# Patient Record
Sex: Male | Born: 1945 | Race: White | Hispanic: No
Health system: Southern US, Academic
[De-identification: ages and names within clinical notes are randomized; demographics above are authoritative.]

## PROBLEM LIST (undated history)

## (undated) ENCOUNTER — Ambulatory Visit

## (undated) ENCOUNTER — Encounter

## (undated) ENCOUNTER — Telehealth

## (undated) ENCOUNTER — Ambulatory Visit: Payer: MEDICARE | Attending: Cardiovascular Disease | Primary: Cardiovascular Disease

## (undated) ENCOUNTER — Encounter: Attending: Cardiovascular Disease | Primary: Cardiovascular Disease

## (undated) ENCOUNTER — Non-Acute Institutional Stay: Payer: MEDICARE | Attending: Cardiovascular Disease | Primary: Cardiovascular Disease

## (undated) ENCOUNTER — Encounter
Attending: Student in an Organized Health Care Education/Training Program | Primary: Student in an Organized Health Care Education/Training Program

## (undated) ENCOUNTER — Ambulatory Visit: Payer: MEDICARE

## (undated) ENCOUNTER — Encounter: Payer: MEDICARE | Attending: Cardiovascular Disease | Primary: Cardiovascular Disease

## (undated) ENCOUNTER — Encounter: Attending: Hematology | Primary: Hematology

## (undated) ENCOUNTER — Ambulatory Visit: Payer: MEDICARE | Attending: "Endocrinology | Primary: "Endocrinology

## (undated) ENCOUNTER — Encounter: Payer: MEDICARE | Attending: Hematology | Primary: Hematology

## (undated) ENCOUNTER — Ambulatory Visit
Payer: MEDICARE | Attending: Student in an Organized Health Care Education/Training Program | Primary: Student in an Organized Health Care Education/Training Program

## (undated) ENCOUNTER — Encounter
Attending: Pharmacist Clinician (PhC)/ Clinical Pharmacy Specialist | Primary: Pharmacist Clinician (PhC)/ Clinical Pharmacy Specialist

## (undated) ENCOUNTER — Encounter: Payer: MEDICARE | Attending: "Endocrinology | Primary: "Endocrinology

## (undated) ENCOUNTER — Telehealth
Attending: Student in an Organized Health Care Education/Training Program | Primary: Student in an Organized Health Care Education/Training Program

## (undated) ENCOUNTER — Ambulatory Visit: Payer: MEDICARE | Attending: Vascular Surgery | Primary: Vascular Surgery

## (undated) ENCOUNTER — Encounter: Attending: "Endocrinology | Primary: "Endocrinology

## (undated) ENCOUNTER — Inpatient Hospital Stay

## (undated) ENCOUNTER — Ambulatory Visit: Payer: MEDICARE | Attending: Nephrology | Primary: Nephrology

## (undated) ENCOUNTER — Non-Acute Institutional Stay: Payer: MEDICARE | Attending: Hematology | Primary: Hematology

## (undated) ENCOUNTER — Telehealth: Attending: Vascular Neurology | Primary: Vascular Neurology

## (undated) ENCOUNTER — Encounter: Attending: Nephrology | Primary: Nephrology

## (undated) ENCOUNTER — Telehealth: Attending: Vascular Surgery | Primary: Vascular Surgery

## (undated) ENCOUNTER — Telehealth: Attending: Nurse Practitioner | Primary: Nurse Practitioner

## (undated) DIAGNOSIS — I2510 Atherosclerotic heart disease of native coronary artery without angina pectoris: Secondary | ICD-10-CM

## (undated) DIAGNOSIS — I701 Atherosclerosis of renal artery: Secondary | ICD-10-CM

## (undated) DIAGNOSIS — M79671 Pain in right foot: Secondary | ICD-10-CM

## (undated) DIAGNOSIS — I714 Abdominal aortic aneurysm, without rupture, unspecified: Secondary | ICD-10-CM

## (undated) DIAGNOSIS — M79672 Pain in left foot: Secondary | ICD-10-CM

## (undated) DIAGNOSIS — E079 Disorder of thyroid, unspecified: Secondary | ICD-10-CM

## (undated) DIAGNOSIS — I10 Essential (primary) hypertension: Secondary | ICD-10-CM

## (undated) DIAGNOSIS — E230 Hypopituitarism: Secondary | ICD-10-CM

## (undated) DIAGNOSIS — I219 Acute myocardial infarction, unspecified: Secondary | ICD-10-CM

## (undated) HISTORY — PX: OTHER SURGICAL HISTORY: SHX169

## (undated) HISTORY — DX: Pain in right foot: M79.671

## (undated) HISTORY — DX: Pain in left foot: M79.672

---
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
## 2002-06-15 ENCOUNTER — Inpatient Hospital Stay (HOSPITAL_COMMUNITY): Admission: EM | Admit: 2002-06-15 | Discharge: 2002-06-19 | Payer: Self-pay

## 2002-06-15 ENCOUNTER — Encounter: Payer: Self-pay | Admitting: *Deleted

## 2002-06-18 ENCOUNTER — Encounter: Payer: Self-pay | Admitting: Cardiology

## 2002-07-16 ENCOUNTER — Encounter (HOSPITAL_COMMUNITY): Admission: RE | Admit: 2002-07-16 | Discharge: 2002-08-15 | Payer: Self-pay | Admitting: Cardiology

## 2008-01-20 ENCOUNTER — Ambulatory Visit: Payer: Self-pay | Admitting: Cardiology

## 2009-01-18 ENCOUNTER — Ambulatory Visit: Payer: Self-pay | Admitting: Cardiology

## 2009-01-20 ENCOUNTER — Ambulatory Visit: Payer: Self-pay | Admitting: Cardiovascular Disease

## 2009-01-20 ENCOUNTER — Inpatient Hospital Stay (HOSPITAL_COMMUNITY): Admission: AD | Admit: 2009-01-20 | Discharge: 2009-01-21 | Payer: Self-pay | Admitting: Internal Medicine

## 2009-02-02 DIAGNOSIS — D751 Secondary polycythemia: Secondary | ICD-10-CM

## 2009-02-02 DIAGNOSIS — E230 Hypopituitarism: Secondary | ICD-10-CM

## 2009-02-02 DIAGNOSIS — E785 Hyperlipidemia, unspecified: Secondary | ICD-10-CM

## 2009-02-02 DIAGNOSIS — I2510 Atherosclerotic heart disease of native coronary artery without angina pectoris: Secondary | ICD-10-CM

## 2009-02-02 DIAGNOSIS — I498 Other specified cardiac arrhythmias: Secondary | ICD-10-CM

## 2009-02-02 DIAGNOSIS — K219 Gastro-esophageal reflux disease without esophagitis: Secondary | ICD-10-CM

## 2009-02-02 DIAGNOSIS — I10 Essential (primary) hypertension: Secondary | ICD-10-CM | POA: Insufficient documentation

## 2009-02-02 DIAGNOSIS — I2119 ST elevation (STEMI) myocardial infarction involving other coronary artery of inferior wall: Secondary | ICD-10-CM

## 2009-03-08 ENCOUNTER — Emergency Department (HOSPITAL_COMMUNITY): Admission: EM | Admit: 2009-03-08 | Discharge: 2009-03-08 | Payer: Self-pay | Admitting: Emergency Medicine

## 2009-03-22 ENCOUNTER — Encounter (INDEPENDENT_AMBULATORY_CARE_PROVIDER_SITE_OTHER): Payer: Self-pay | Admitting: *Deleted

## 2009-03-22 ENCOUNTER — Emergency Department (HOSPITAL_COMMUNITY): Admission: EM | Admit: 2009-03-22 | Discharge: 2009-03-22 | Payer: Self-pay | Admitting: Emergency Medicine

## 2009-03-22 LAB — CONVERTED CEMR LAB
BUN: 36 mg/dL
CK-MB: <1 ng/mL
CO2: 29 meq/L
Calcium: 9.3 mg/dL
Chloride: 102 meq/L
Creatinine, Ser: 2.84 mg/dL
GFR calc non Af Amer: 23 mL/min
Glomerular Filtration Rate, Af Am: 27 mL/min/{1.73_m2}
Glucose, Bld: 100 mg/dL
HCT: 48.3 %
Hemoglobin: 16.8 g/dL
MCV: 89.3 fL
Platelets: 200 10*3/uL
Potassium: 4.5 meq/L
Sodium: 136 meq/L
Total CK: 128 U/L
Troponin I: 0.06 ng/mL
WBC: 11.6 10*3/uL

## 2009-05-18 ENCOUNTER — Encounter (INDEPENDENT_AMBULATORY_CARE_PROVIDER_SITE_OTHER): Payer: Self-pay | Admitting: *Deleted

## 2009-05-19 ENCOUNTER — Encounter: Payer: Self-pay | Admitting: Cardiology

## 2010-05-21 IMAGING — CR DG CHEST 2V
2 series · 2 of 2 positions shown · non-contrast
Comparison: 01/18/2009

CLINICAL DATA: Chest pain question cavitary lesion

CHEST - 2 VIEW

[view not recorded (1 of 2)]
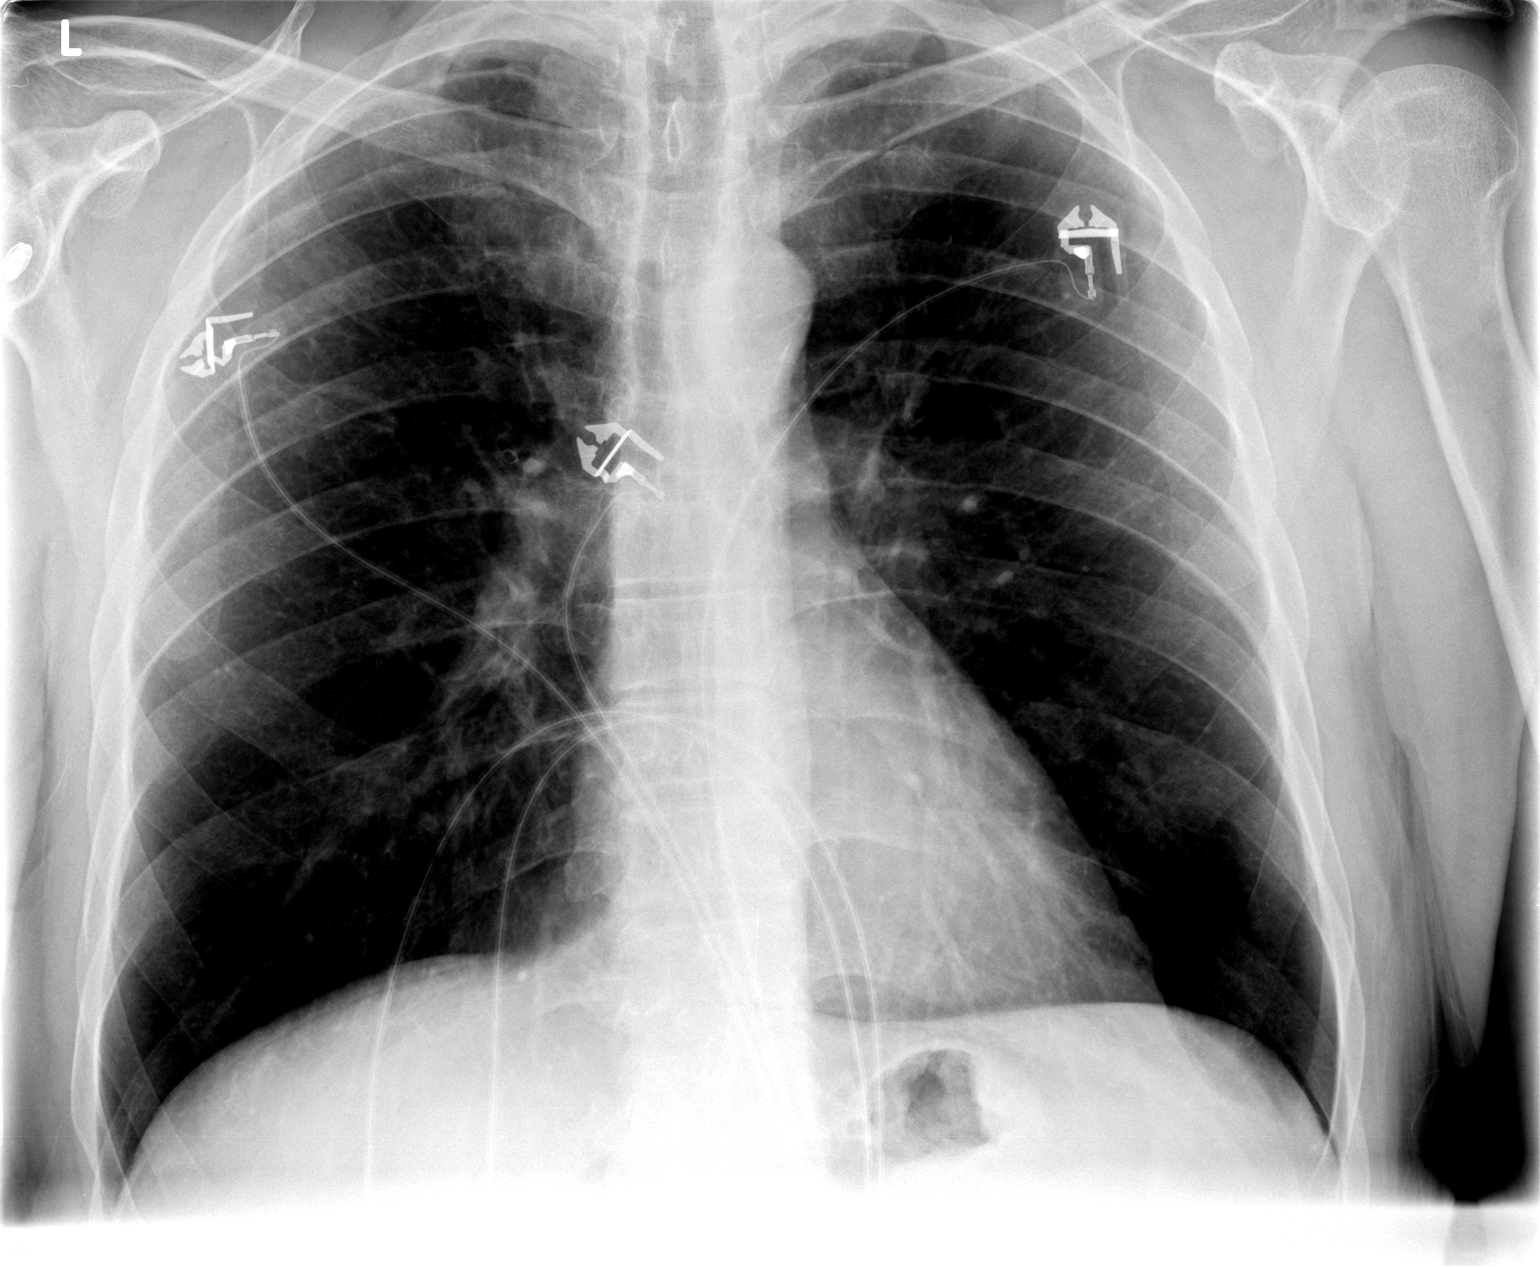

[view not recorded (2 of 2)]
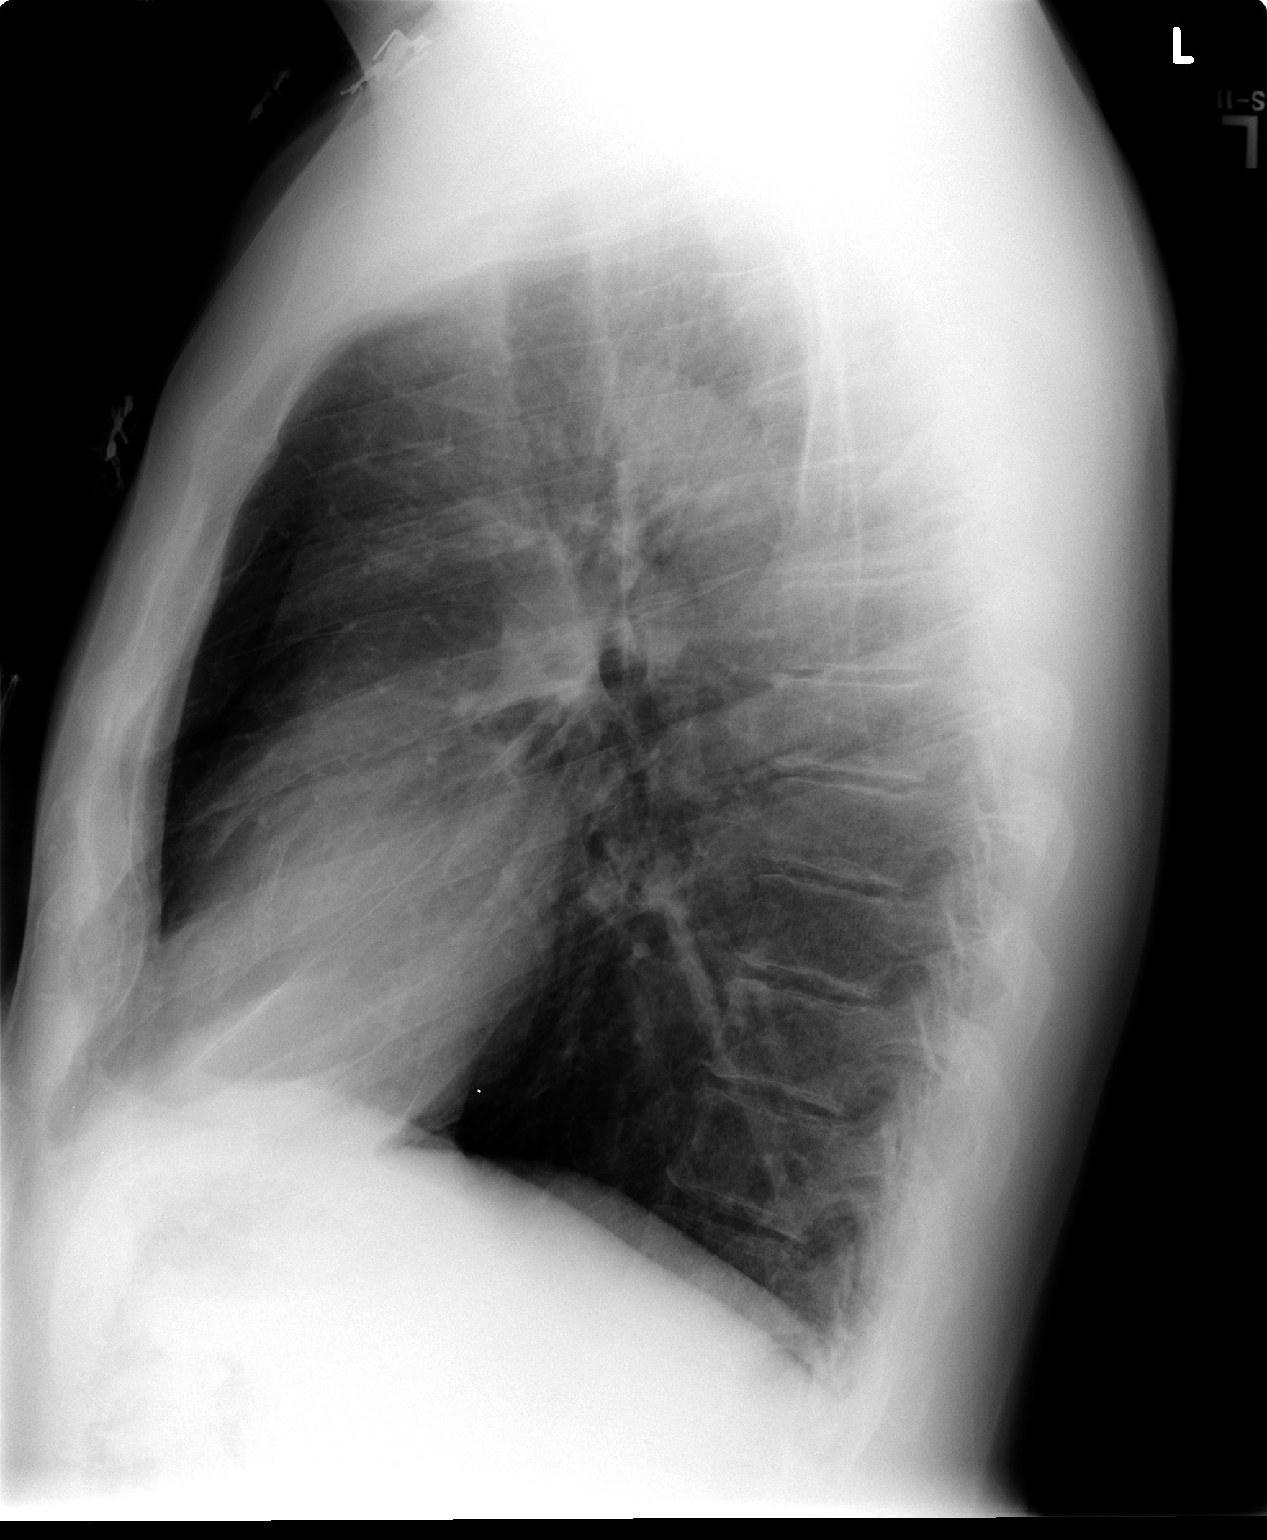

[2 of 2 positions shown; findings below may reference images not displayed]

FINDINGS: Normal heart size, mediastinal contours, and pulmonary vascularity.
Bronchitic changes.
No pulmonary infiltrate, pleural effusion, or pneumothorax.
Bones unremarkable.
No cavitary lung lesion identified.
IMPRESSION: Mild bronchitic changes.

## 2010-11-26 LAB — CBC
HCT: 48.3 % (ref 39.0–52.0)
Hemoglobin: 16.8 g/dL (ref 13.0–17.0)
MCHC: 34.8 g/dL (ref 30.0–36.0)
MCV: 89.3 fL (ref 78.0–100.0)
Platelets: 200 10*3/uL (ref 150–400)
RBC: 5.41 MIL/uL (ref 4.22–5.81)
RDW: 14.1 % (ref 11.5–15.5)
WBC: 11.6 10*3/uL — ABNORMAL HIGH (ref 4.0–10.5)

## 2010-11-26 LAB — POCT CARDIAC MARKERS
CKMB, poc: <1 ng/mL — ABNORMAL LOW (ref 1.0–8.0)
CKMB, poc: <1 ng/mL — ABNORMAL LOW (ref 1.0–8.0)
Myoglobin, poc: 114 ng/mL (ref 12–200)
Myoglobin, poc: 128 ng/mL (ref 12–200)
Troponin i, poc: 0.06 ng/mL (ref 0.00–0.09)
Troponin i, poc: 0.07 ng/mL (ref 0.00–0.09)

## 2010-11-26 LAB — BASIC METABOLIC PANEL
Chloride: 102 mEq/L (ref 96–112)
GFR calc Af Amer: 27 mL/min — ABNORMAL LOW (ref >60)
GFR calc non Af Amer: 23 mL/min — ABNORMAL LOW (ref >60)
Potassium: 4.5 mEq/L (ref 3.5–5.1)
Sodium: 136 mEq/L (ref 135–145)

## 2010-11-26 LAB — DIFFERENTIAL
Basophils Absolute: 0.1 K/uL (ref 0.0–0.1)
Basophils Relative: 1 % (ref 0–1)
Eosinophils Absolute: 0.7 10*3/uL (ref 0.0–0.7)
Eosinophils Relative: 6 % — ABNORMAL HIGH (ref 0–5)
Lymphocytes Relative: 14 % (ref 12–46)
Lymphs Abs: 1.6 10*3/uL (ref 0.7–4.0)
Monocytes Absolute: 0.6 K/uL (ref 0.1–1.0)
Monocytes Relative: 6 % (ref 3–12)
Neutro Abs: 8.6 K/uL — ABNORMAL HIGH (ref 1.7–7.7)
Neutrophils Relative %: 74 % (ref 43–77)

## 2010-11-26 LAB — BASIC METABOLIC PANEL WITH GFR
BUN: 36 mg/dL — ABNORMAL HIGH (ref 6–23)
CO2: 29 meq/L (ref 19–32)
Calcium: 9.3 mg/dL (ref 8.4–10.5)
Creatinine, Ser: 2.84 mg/dL — ABNORMAL HIGH (ref 0.4–1.5)
Glucose, Bld: 100 mg/dL — ABNORMAL HIGH (ref 70–99)

## 2010-11-27 LAB — DIFFERENTIAL
Basophils Relative: 0 % (ref 0–1)
Eosinophils Absolute: 0.8 10*3/uL — ABNORMAL HIGH (ref 0.0–0.7)
Eosinophils Relative: 7 % — ABNORMAL HIGH (ref 0–5)
Lymphs Abs: 2 10*3/uL (ref 0.7–4.0)
Monocytes Absolute: 0.7 10*3/uL (ref 0.1–1.0)
Monocytes Relative: 6 % (ref 3–12)

## 2010-11-27 LAB — BASIC METABOLIC PANEL
CO2: 30 mEq/L (ref 19–32)
Chloride: 99 mEq/L (ref 96–112)
GFR calc Af Amer: 39 mL/min — ABNORMAL LOW (ref >60)
Potassium: 4.1 mEq/L (ref 3.5–5.1)

## 2010-11-27 LAB — POCT CARDIAC MARKERS: Myoglobin, poc: 92.9 ng/mL (ref 12–200)

## 2010-11-27 LAB — CBC
HCT: 50.2 % (ref 39.0–52.0)
MCHC: 34.5 g/dL (ref 30.0–36.0)
MCV: 90.3 fL (ref 78.0–100.0)
RBC: 5.56 MIL/uL (ref 4.22–5.81)
WBC: 12.7 10*3/uL — ABNORMAL HIGH (ref 4.0–10.5)

## 2010-11-27 LAB — URINALYSIS, ROUTINE W REFLEX MICROSCOPIC
Glucose, UA: NEGATIVE mg/dL
Hgb urine dipstick: NEGATIVE
Ketones, ur: NEGATIVE mg/dL
Protein, ur: NEGATIVE mg/dL
pH: 5.5 (ref 5.0–8.0)

## 2010-11-28 LAB — CBC
HCT: 47.6 % (ref 39.0–52.0)
HCT: 50.7 % (ref 39.0–52.0)
Hemoglobin: 16.6 g/dL (ref 13.0–17.0)
Hemoglobin: 17.1 g/dL — ABNORMAL HIGH (ref 13.0–17.0)
MCHC: 33.8 g/dL (ref 30.0–36.0)
MCHC: 34.8 g/dL (ref 30.0–36.0)
RBC: 5.36 MIL/uL (ref 4.22–5.81)
RDW: 14.9 % (ref 11.5–15.5)

## 2010-11-28 LAB — DIFFERENTIAL
Basophils Relative: 1 % (ref 0–1)
Eosinophils Relative: 7 % — ABNORMAL HIGH (ref 0–5)
Lymphocytes Relative: 20 % (ref 12–46)
Monocytes Absolute: 0.6 10*3/uL (ref 0.1–1.0)
Monocytes Relative: 7 % (ref 3–12)
Neutro Abs: 5.2 10*3/uL (ref 1.7–7.7)

## 2010-11-28 LAB — LIPID PANEL
Cholesterol: 189 mg/dL (ref 0–200)
HDL: 22 mg/dL — ABNORMAL LOW (ref >39)
LDL Cholesterol: 144 mg/dL — ABNORMAL HIGH (ref 0–99)
Total CHOL/HDL Ratio: 8.6 RATIO
Triglycerides: 117 mg/dL (ref <150)

## 2010-11-28 LAB — BASIC METABOLIC PANEL
CO2: 29 mEq/L (ref 19–32)
Glucose, Bld: 89 mg/dL (ref 70–99)
Potassium: 3.9 mEq/L (ref 3.5–5.1)
Sodium: 143 mEq/L (ref 135–145)

## 2010-11-28 LAB — CARDIAC PANEL(CRET KIN+CKTOT+MB+TROPI)
CK, MB: 2.5 ng/mL (ref 0.3–4.0)
Relative Index: INVALID (ref 0.0–2.5)
Total CK: 43 U/L (ref 7–232)
Total CK: 46 U/L (ref 7–232)
Total CK: 50 U/L (ref 7–232)

## 2010-11-28 LAB — PROTIME-INR: INR: 1.1 (ref 0.00–1.49)

## 2010-11-29 LAB — DIFFERENTIAL
Basophils Absolute: 0.1 10*3/uL (ref 0.0–0.1)
Eosinophils Relative: 6 % — ABNORMAL HIGH (ref 0–5)
Lymphocytes Relative: 24 % (ref 12–46)
Neutro Abs: 6 10*3/uL (ref 1.7–7.7)

## 2010-11-29 LAB — BASIC METABOLIC PANEL
BUN: 15 mg/dL (ref 6–23)
Calcium: 9.6 mg/dL (ref 8.4–10.5)
Creatinine, Ser: 1.28 mg/dL (ref 0.4–1.5)
GFR calc non Af Amer: 57 mL/min — ABNORMAL LOW (ref >60)
Glucose, Bld: 87 mg/dL (ref 70–99)

## 2010-11-29 LAB — CBC
Platelets: 179 10*3/uL (ref 150–400)
RDW: 14.8 % (ref 11.5–15.5)

## 2010-11-29 LAB — CK TOTAL AND CKMB (NOT AT ARMC)
CK, MB: 2.3 ng/mL (ref 0.3–4.0)
Relative Index: INVALID (ref 0.0–2.5)

## 2010-11-29 LAB — HEPATIC FUNCTION PANEL
ALT: 15 U/L (ref 0–53)
Indirect Bilirubin: 0.3 mg/dL (ref 0.3–0.9)
Total Protein: 5.9 g/dL — ABNORMAL LOW (ref 6.0–8.3)

## 2010-11-29 LAB — POCT CARDIAC MARKERS: Troponin i, poc: <0.05 ng/mL (ref 0.00–0.09)

## 2010-11-29 LAB — TROPONIN I
Troponin I: 0.06 ng/mL (ref 0.00–0.06)
Troponin I: 0.17 ng/mL — ABNORMAL HIGH (ref 0.00–0.06)

## 2011-01-03 NOTE — Consult Note (Signed)
Mathew Rodriguez, Mathew Rodriguez             ACCOUNT NO.:  [id number]   MEDICAL RECORD NO.:  [id number]          PATIENT TYPE:  OBV   LOCATION:  IC05                          FACILITY:  [facility]   PHYSICIAN:  Gerrit Friends. Dietrich Pates, MD, FACCDATE OF BIRTH:  [birth year]   DATE OF CONSULTATION:  01/19/2009  DATE OF DISCHARGE:                                 CONSULTATION   REFERRING PHYSICIAN:  Osvaldo Shipper, MD of [facility]   PRIMARY CARE PHYSICIAN:  Dr. Lewis Moccasin at __________ [facility].   CARDIOLOGIST:  Gerrit Friends. Dietrich Pates, MD, [facility].   REASON FOR CONSULTATION:  Chest pain.   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  He denies associated shortness of  breath, nausea, diaphoresis or syncope.  He took 2 aspirin and then  nitroglycerin, was relieved of his symptoms.  His pain lasted in total  about 1 hour.  He states that his symptoms were similar to, but not as  bad as his myocardial infarction.  He is painfree now.  His EKG is  unremarkable except for sinus bradycardia.  His beta blocker has been  held secondary to heart rates in the 40s.  He has a cavitary lesion on  his anteroposterior chest x-ray.  Repeat chest x-ray is currently  pending.  His troponin has been noted to be elevated at 0.17 and  then  0.34.  We are now asked to further evaluate.   PAST MEDICAL HISTORY:  1. Coronary artery disease.      a.     Status post inferior ST-elevation myocardial infarction       October 2003.  Treated with fiber drug-eluting stent x2 to the       RCA.      b.     Residual cardio disease.      c.     Cardiac catheterization October 2003:  First  diagonal 60%       proximal, LAD mid 30%, circumflex 50% proximal, EF 72%.      d.     Echocardiogram October 2003:  EF 55-65%, no wall motion       abnormalities.      e.     Myoview study November 2003:  Inferior scar.  No ischemia.  2. Hyperlipidemia.  3. Pituitary insufficiency.  4. GERD.  5. Hypertension.  6. History of polycythemia previously evaluated at [facility].   MEDICATIONS AT HOME:  1. Citalopram 20 mg daily.  2. Prednisone 5 mg daily.  3. HCTZ 25 mg daily.  4. Toprol XL 100 mg daily.  5. Levoxyl 4.25 mg daily.  6. Depo-Testosterone q. 2 weeks.  7. Aspirin 81 mg daily.  8. Nexium 40 mg daily.   ALLERGIES:  NO KNOWN DRUG ALLERGIES.  SOCIAL HISTORY:  The patient lives in [place] with his wife.  He  works in TEFL teacher.  He has a 40 pack-year history of smoking and  continues to smoke cigarettes.  He denies alcohol abuse.   FAMILY HISTORY:  Significant for CAD.  His father died in his 62s from a  myocardial infarction.  His brother is still alive in his 37s after  undergoing bypass surgery.   REVIEW OF SYSTEMS:  Please see HPI.  Denies fevers, chills, headache,  dysuria, hematuria, bright red blood per rectum or melena, dysphagia,  orthopnea, PND, pedal edema, palpitations, syncope, near syncope or  cough.  All other systems reviewed and negative.   PHYSICAL EXAM:  He is a well-nourished, well-developed male in no acute  distress.  Blood pressure is 119/71, pulse 40, his respiration is 16, temperature  96.9, oxygen saturation 95% room air.  Weight 90.4 kg.  HEENT:  Normal.  NECK:  Without JVD.  LYMPH:  Without lymphadenopathy.  ENDOCRINE:  Without thyromegaly.  CARDIAC:  Normal S1, S2.  Distant heart sounds.  Regular rate and  rhythm.  No murmur.  LUNGS:  Clear to auscultation bilaterally.  No rales.  SKIN:  Warm and dry.  ABDOMEN:  Soft, nontender with normoactive bowel sounds and  organomegaly.  EXTREMITIES:  Without clubbing, cyanosis or edema.   MUSCULOSKELETAL:  Without joint deformity.  NEUROLOGIC:  He is alert and oriented x3.  Cranial nerves II-XII grossly  intact.  VASCULAR:  Dorsalis pedis and posterior tibialis pulses are 2+  bilaterally.  No femoral bruits noted bilaterally.   One-view chest x-ray:  Question cavitary lesion right lung, recommend  follow-up PA and lateral follow-up.  Follow-up PA and lateral chest x-  ray with mild bronchitic changes.  No cavitary lesion identified.  EKG:  Sinus bradycardia with a heart rate of 45, T-wave inversions in V1  through V3, poor R-wave progression, low voltage.   LABS:  Hemoglobin 17.4, MCV 89.8, platelet count 179,000.  Potassium 4,  creatinine 1.28, albumin 3.5.  CK-MB negative x2.  Troponin-I of 0.17,  0.34, 0.18.   ASSESSMENT:  1. Non ST-elevation myocardial infarction in a 65 year old male with a      history of coronary disease status post inferior ST-elevation      myocardial infarction in October 2003, treated with drug-eluting      stent x2 to the right coronary artery, residual coronary artery      disease as outlined above and previously-documented normal left      ventricular function.  2. Hypertension.  3. Hyperlipidemia.  4. Asymptomatic sinus bradycardia.  5. Pituitary insufficiency.  6. Gastroesophageal reflux disease,   RECOMMENDATIONS:  The patient was also interviewed and examined by Dr.  Dietrich Pates.  Prior records have been reviewed.  The patient presents with  chest symptoms similar to his previous angina.  He continues to smoke  cigarettes.  He did not change his therapy, as advised at his last  office visit and he is currently not on a statin.  With his elevated  enzymes, he is ruling in for non ST-elevation myocardial infarction.  At  this point, we recommend proceeding with cardiac catheterization.  Risks  and benefits of the procedure have been explained to the patient,  including stroke, MI, and death, and he agrees to proceed.  High-dose   simvastatin will be added to his medical regimen.  His beta blocker has  been on hold secondary to bradycardia and we will continue to hold  that.  He will be placed on full-dose heparin.  He will continue on aspirin.  He will be loaded with clopidogrel.  His hydrochlorothiazide will be  discontinued.  I will likely add an ACE inhibitor, postcatheterization.  An echocardiogram is currently pending.  He has been advised to  discontinue smoking.  We will arrange transfer to [facility]  tomorrow for cardiac catheterization.   Thank very much for the consultation.  We will be glad to follow the  patient throughout the remainder of this admission.      Tereso Newcomer, PA-C      Gerrit Friends. Dietrich Pates, MD, [facility]  Electronically Signed    SW/MEDQ  D:  01/19/2009  T:  01/19/2009  Job:  [id number]   cc:   Osvaldo Shipper, MD   Lewis Moccasin, Dr.  [facility]

## 2011-01-03 NOTE — Discharge Summary (Signed)
Mathew Rodriguez, Mathew Rodriguez             ACCOUNT NO.:  [id number]   MEDICAL RECORD NO.:  [id number]           PATIENT TYPE:   LOCATION:                                 FACILITY:   PHYSICIAN:  Veverly Fells. Excell Seltzer, MD  DATE OF BIRTH:  03/15/1946   DATE OF ADMISSION:  DATE OF DISCHARGE:                               DISCHARGE SUMMARY   PRIMARY CARDIOLOGIST:  Gerrit Friends. Dietrich Pates, MD, [facility]   PRIMARY CARE Tiffiany Beadles:  Dr. Lewis Moccasin.   DISCHARGE DIAGNOSIS:  Non-ST-segment elevation myocardial infarction.   SECONDARY DIAGNOSES:  1. Coronary artery disease status post inferior ST-elevation      myocardial infarction in October 2003 with drug-eluting stent to      the right coronary artery at that time.  2. Hyperlipidemia.  3. Pituitary insufficiency.  4. Gastroesophageal reflux disease.  5. Hypertension.  6. Polycythemia.  7. Tobacco abuse.   ALLERGIES:  No known drug allergies.   PROCEDURES:  Left heart cardiac catheterization with successful  percutaneous coronary intervention and stenting of the left circumflex  with placement of a 2.75 x 23 mm XIENCE V drug-eluting stent.  The  patient otherwise had nonobstructive coronary artery disease with normal  left ventricular function, ejection fraction of 55%.   HISTORY OF PRESENT ILLNESS:  A 65 year old male with prior history of  CAD status post inferior MI in 2003 with PCI to the RCA.  He was in his  usual state of health until Jan 18, 2009, when he was eating a [employer]iation [employer] practitioner and developed sudden onset of left arm and substernal chest  discomfort, similar to previous angina.  He took 2 aspirin and then  nitroglycerin with eventual relief of symptoms after about an hour.  He  presented to the [facility] where ECG showed no acute ST-T  changes; however, his troponin was elevated at 0.17 and subsequently  bumped to 0.34.  He was evaluated by Dr. Dietrich Pates, and decision was made  to transfer him to [facility] for further evaluation.   HOSPITAL COURSE:  The patient eventually peaked his CK at 2, his MB at  3.0, and his troponin I at 0.34.  He underwent left heart cardiac  catheterization on January 20, 2009, revealing a critical stenosis of  proximal left circumflex and otherwise, nonobstructive disease and  normal LV function.  The left circumflex was successfully treated with a  2.75 x 23 mm XIENCE V drug-eluting stents.  The patient tolerated the  procedure well, and postprocedure, he has been ambulating without  recurrent symptoms or limitations.  Plan to discharge him home today in  good condition.  He has been counseled on the importance of smoking  cessation.   DISCHARGE LABORATORY DATA:  Hemoglobin 17,1, hematocrit 50.7, WBC 8.7,  platelets 170.  Sodium 143, potassium 3.9, chloride 107, CO2 29, BUN 8,  creatinine 1.20, glucose 89, calcium 9.0.  Total bilirubin 0.5, alkaline  phosphatase 69, AST 16, ALT 15, total protein 5.9, albumin 3.5.  CK 50,  MB 2.9, troponin I 0.33.  Total cholesterol 189, triglycerides 117, HDL  22, LDL 144.  DISPOSITION:  The patient is being discharged home today in good  condition.   FOLLOWUP PLANS AND APPOINTMENTS:  We have arranged for a followup with  Dr. Dietrich Pates on February 05, 2009, at 3:15 p.m.  He is asked to follow up  with primary care Zahki Hoogendoorn as previously scheduled.   DISCHARGE MEDICATIONS:  1. Aspirin 325 mg daily.  2. Plavix 75 mg daily.  3. Celexa 20 mg daily.  4. Prednisone 5 mg daily.  5. Hydrochlorothiazide 25 mg daily.  6. Toprol-XL 25 mg daily.  7. Simvastatin 40 mg nightly.  8. Levoxyl 125 mcg daily.  9. Depo - testosterone as previously prescribed.  10.Nexium 40 mg daily.  11.Nitroglycerin 0.4 mg sublingual p.r.n. chest pain.   OUTSTANDING LABORATORY STUDIES:  None.   DURATION OF DISCHARGE ENCOUNTER:  45 minutes including physician time.      Nicolasa Ducking, ANP      Veverly Fells. Excell Seltzer, MD  Electronically Signed    CB/MEDQ  D:  01/21/2009  T:   01/21/2009  Job:  [id number]   cc:   Dr. Lewis Moccasin.

## 2011-01-03 NOTE — Assessment & Plan Note (Signed)
[facility]                       [place] OFFICE NOTE   Mathew Rodriguez, Mathew Rodriguez                    MRN:          [id number]  DATE:01/20/2008                            DOB:          [birth year]    REFERRING PHYSICIAN:  [facility]   Mathew Rodriguez return to the office at the request of his hematologist.  He  was last seen here in 2004, having undergone a drug-eluting stent  placement in the right coronary artery in October, 2003.  He had briefly  discontinued cigarette smoking but resumed after a few months.  He tells  me that his lipids and blood pressure control have been good.  He has  not been hospitalized in the intervening time.  He was recently referred  to hematology at [facility] for what looks like polycythemia.  I  suspect this is [place] syndrome, related to cigarette smoking, but  evaluation is in progress.  He tells me that the hematologist advised  him to return to his cardiologist for continuing assessment and also  suggested that his diuretic may be causing his hematologic issues.   CURRENT MEDICATIONS:  1. Levothyroxine 0.125 mg daily.  2. Toprol 100 mg daily.  3. Depo-testosterone.  4. Aspirin 81 mg daily.  5. Prednisone 7.5 mg daily.  6. Citalopram 20 mg daily.  7. Nexium 40 mg daily.  8. Avodart 0.5 mg daily.  9. HCTZ 25 mg daily.  10.Simvastatin 40 mg daily.   PHYSICAL EXAMINATION:  A pleasant, matter-of-fact gentleman in no acute  distress who seems unconcerned by his health problems.  He does agree  that he needs to stop smoking.  The weight is 187, 16 pounds less than in 2004.  Blood pressure 150/90,  heart rate 64 and regular, respirations 18.  NECK:  No jugular venous distention.  No carotid bruits.  HEENT:  EOMs full; normal oral mucosa.  ENDOCRINE:  No thyromegaly.  HEMAPOETIC:  No adenopathy.  LUNGS:  Inspiratory and expiratory rhonchi; some expiratory wheezing.  CARDIAC:  Normal  first and second heart sounds; fourth heart sound  present.  ABDOMEN:  Soft and nontender; no masses; no organomegaly.  EXTREMITIES:  Normal distal pulses; no edema.   IMPRESSION:  Mathew Rodriguez is doing surprisingly well.  He is strongly  advised to discontinue cigarette smoking.  We will check a lipid  profile.  Blood pressure control is suboptimal.  HCTZ  will be changed to lisinopril/HCT 20/12.5 mg daily with a chemistry  profile and repeat blood  pressure assessment to be obtained in one month.  I will plan to see  this nice gentleman again in four months.     Gerrit Friends. Dietrich Pates, MD, [facility]  Electronically Signed    RMR/MedQ  DD: 01/20/2008  DT: 01/20/2008  Job #: [id number]

## 2011-01-03 NOTE — H&P (Signed)
[name], [name]             ACCOUNT NO.:  [id number]   MEDICAL RECORD NO.:  [id number]          PATIENT TYPE:  OBV   LOCATION:  IC05                          FACILITY:  [facility]   PHYSICIAN:  Osvaldo Shipper, MD     DATE OF BIRTH:  May 21, 1946   DATE OF ADMISSION:  01/18/2009  DATE OF DISCHARGE:  LH                              HISTORY & PHYSICAL   PRIMARY CARE PHYSICIAN:  Dr. Lewis Moccasin at [facility],  [place].   ADMISSION DIAGNOSES:  1. Chest pain.  2. History of coronary artery disease.  3. Hypertension.  4. Pituitary insufficiency.   CHIEF COMPLAINT:  Chest pain.   HISTORY OF PRESENT ILLNESS:  The patient is a 65 year old Caucasian male  who has a history of hypertension and actually was diagnosed with an  acute MI in 2003 and underwent cardiac cath and stents to his right  coronary artery.  The patient has done quite well since then.  He denies  any chest pain in the interim.  Today he was at [facility] eating a  hamburger when he started experiencing left arm pain as well as  retrosternal chest tightness.  This was 5/10 in intensity.  He stopped  eating the hamburger and he took two aspirin.  The pain persisted so he  went home and took a nitroglycerin sublingually with which the pain  disappeared in about 15-20 minutes.  The pain lasted about one hour.  He  denies any shortness of breath, nausea, vomiting, diaphoresis, fever,  chills, cough.  Currently he is chest pain free.   He also mentions symptoms consistent with dysphagia for the last many  months.  He also complains of right upper quadrant pain that he has had  on and off for the past few months as well.   MEDICATIONS AT HOME:  1. Citalopram 20 mg daily.  2. Prednisone 5 mg daily.  3. HCTZ 25 mg daily.  4. Toprol XL 100 mg daily.  5. Levoxyl 125 mcg daily.  6. Depo-Testosterone every two weeks.  7. Aspirin 81 mg daily.  8. Nexium one tablet daily.   ALLERGIES:  No  known drug allergies.   PAST MEDICAL HISTORY:  1. Hypertension.  2. Coronary artery disease, status post MI in 2003 with a stent      placement to RCA.  3. He was diagnosed pituitary insufficiency 30 years ago and has been      on steroids, on the Synthroid and testosterone since then.  4. He was evaluated at [facility] for polycythemia.  5. He denies any other surgeries.   SOCIAL HISTORY:  He lives in [place] with his wife.  Works as a  Soil scientist.  Smokes one pack of cigarettes on a daily basis.  No alcohol  use.  No illicit drug use.   FAMILY HISTORY:  Positive for MI and unknown cancer.   REVIEW OF SYSTEMS:  GENERAL:  Positive for weakness, malaise.  HEENT:  Unremarkable.  CARDIOVASCULAR:  Unremarkable except as in HPI.  RESPIRATORY:  As in HPI.  GASTROINTESTINAL:  Unremarkable.  GENITOURINARY:  Unremarkable.  NEUROLOGIC:  Unremarkable.  PSYCHIATRIC:  Unremarkable.  Other systems unremarkable.   PHYSICAL EXAMINATION:  VITAL SIGNS:  Temperature 98.1, blood pressure  139/87, heart rate 60, respiratory rate 22, saturation 98% on room air.  GENERAL:  Well-developed, well-nourished white male in no distress.  HEENT:  There is no pallor, no icterus.  Oral mucosa membrane is moist.  No oral lesions are noted.  NECK:  Soft and supple.  No thyromegaly is appreciated.  No cervical  lymphadenopathy.  LUNGS:  Clear to auscultation bilaterally.  No wheezing, rales, or  rhonchi.  CARDIOVASCULAR:  S1/S2 normal, regular.  No murmurs appreciated.  No  S3/S4.  No rubs, no bruits.  ABDOMEN:  Soft, nontender, nondistended.  Bowel sounds are present.  No  masses or organomegaly is appreciated.  EXTREMITIES:  Show no edema.  Peripheral pulses are palpable.  MUSCULOSKELETAL:  Exam unremarkable.  NEUROLOGIC:  He is alert, oriented x3.  No focal neurological deficits  are present.   LABORATORY DATA:  His CBC shows a hemoglobin of 17.4.  His BMET is  unremarkable.  Cardiac enzymes negative x1.   He had a chest x-ray which  showed question cavitary lesion in the right lung.  Follow-up with a PA  and lateral was recommended.  They felt that this could have been an  overlap of vascular and osseous structures.  He had an EKG done which  shows a sinus rhythm with a normal rate of 52.  Intervals appear to be  in the normal range.  No Q-waves identified.  No acute ST or T-wave  changes identified.  Given the T inversion and we were noticing there  was something wrong with the leads.   ASSESSMENT:  This is a 65 year old Caucasian male with CAD who presents  with chest pain while eating.  Differential diagnoses include angina  versus esophageal spasm. Left arm pain is a somewhat concerning symptom.  EKG is nonacute.  We are awaiting another set of cardiac enzymes.   PLAN:  1. Chest pain in the setting of known coronary artery disease.  The      patient will be observed overnight to rule him out for acute      coronary syndrome.  He will be seen by cardiology in the morning to      set up either an outpatient stress test or maybe a cardiac cath      depending on how the patient does overnight.  He will be continued      on aspirin.  His EF is normal so that is probably why he is not on      ACE inhibitor.  He is on beta blockers but he has an elevated heart      rate so I am going to hold it tonight.  EKG will be repeated.      Lipid panel will be checked.  Smoking cessation counseling was      provided.  2. Pituitary insufficiency.  Continue with prednisone with Levoxyl and      continue with the Depo-Testosterone on the patient.  3. DVT prophylaxis will be initiated.  4. Dysphagia.  I have offered him inpatient consultation with GI;      however, he definitely wants to talk with his PMD first.  Since he      has a long-standing history of GERD he probably will benefit from      an endoscopy.  5. For his suspected cavitary lesion noted on the chest  x-ray we will      do a PA and  lateral.   Further management decisions will depend on results of further testing  and patient's response to treatment.      Osvaldo Shipper, MD  Electronically Signed     GK/MEDQ  D:  01/18/2009  T:  01/19/2009  Job:  [id number]   cc:   Gerrit Friends. Dietrich Pates, MD, [facility]  [address]  [place], [place] [address]   [facility]
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
## 2011-01-06 NOTE — Cardiovascular Report (Signed)
NAME:  Mathew Rodriguez, Mathew Rodriguez                       ACCOUNT NO.:  [id number]   MEDICAL RECORD NO.:  [id number]                   PATIENT TYPE:  INP   LOCATION:  2306                                 FACILITY:  [facility]   PHYSICIAN:  Salvadore Farber, M.D. [facility]         DATE OF BIRTH:  1945/08/23   DATE OF PROCEDURE:  06/15/2002  DATE OF DISCHARGE:                              CARDIAC CATHETERIZATION   PROCEDURES:  1. Coronary angiography  2. Left heart catheterization.  3. Left ventriculography.  4. Stent to the right coronary artery.  5. Perclose of the right femoral artery.   INDICATIONS:  The patient is a 65 year old man without prior history of  cardiac disease.  At noon today, he developed the abrupt onset of 64/10  substernal chest pain radiating to his left neck and left shoulder.  He  presented to [facility] in [place] where ECG demonstrated  inferior ST elevations with deep anterior ST depressions and T wave  inversions. He is transferred for catheterization and with an eye to primary  angioplasty.  He arrived hemodynamically stable having received aspirin,  heparin and nitroglycerin.   DIAGNOSTIC TECHNIQUE:  Informed consent was obtained. The patient consented  to participate in the [facility] trial of PercuSurge distal protection and  acute myocardial infarction.  A 7 French sheath was placed in the right  femoral artery using the modified Seldinger technique. Diagnostic  angiography and ventriculography were performed using JL4, JR4, and 7 French  pigtail catheters.   DIAGNOSTIC FINDINGS:  1. Left main:  Angiographically normal.  2. LAD:  The LAD is a very large vessel wrapping around the apex to supply     much of the inferior wall.  It gives rise to three diagonal branches.     The first diagonal branch is very large and has a 60% stenosis in its     proximal portion.  The mid LAD has a 30% stenosis.  3. Circumflex:  The circumflex has a 50% stenosis  proximally.  4. RCA:  The RCA is a moderate sized vessel giving rise to a small PDA     branch.  There is a thrombotic 99% stenosis of the proximal vessel with     TIMI-1 flow to the distal vessel.  There is a 90% stenosis of the mid     vessel.  5. EF equals 72% with posterobasal akinesis.   PERCUTANEOUS CORONARY INTERVENTION TECHNIQUE:  Anticoagulation was initiated  with ReoPro and additional heparin was given to achieve an ACT of 250  seconds.  The patient randomized to the control arm with the EMERALD trial.  A JR4 guide was advanced over a wire and engaged in the ostium to the right  coronary artery.  A Hi-Torque Floppy wire was advanced across the lesion  into the distal RCA without difficulty.  The lesion was then pre-dilated  using a 2.5 x 15 mm Maverick balloon at 6 atmospheres.  Following this  inflation, there was essentially no change with residual 99% stenosis and  TIMI-1 flow.  Therefore, the lesion was stented using a 3.0 x 18 mm CYPHER  deployed at 14 atmospheres. This restored TIMI-3 flow to the distal vessel.  The more distal lesion was then approached using a 3.5 x 8 mm CYPHER to  directly stent it. This was deployed at 14 atmospheres.  This stent was then  post-dilated using a 3.5 x 8 mm Quantum balloon at 14 atmospheres.  This  post-dilatation was complicated by a proximal edge dissection requiring  stent to bridge the gap between the two other stents.  Due to unavailability  of the desired 13 mm length, a 3.5 x 18 mm CYPHER was deployed to expand the  gap between the two previously placed stents.  All of the stents were then  post-dilated using a 3.5 mm Quantam balloon.  The distal portion of the  stent was post-dilated at 14 atmospheres.  All three stents were post-  dilated at 18 atmospheres.  Final angiograms demonstrated no residual  stenosis, TIMI-3 flow, and grade III blush.   IMPRESSION/RECOMMENDATIONS:  Successful primary angioplasty and stenting of  the  subtotal occlusion in the right coronary artery and a severe lesion at  the mid right coronary artery with excellent angiographic result and  complete resolution of his ST elevations.  We will plan medical therapy for  the residual moderate disease of the left system.  He will be continued on  ReoPro for 12 hours.  Plavix will be continued for a minimal of 90 days  since drug-eluting stents were used.  Strong consideration need be given to  nine-month course. He will be continued on aspirin indefinitely.  Beta  blocker will be administered as heart rate allows.  ACE inhibitor will be  given as blood pressure allows.  Smoking cessation will be encouraged.  Lipids and hemoglobin A1c will be checked in the morning.                                               Salvadore Farber, M.D. [facility]    WED/MEDQ  D:  06/15/2002  T:  06/16/2002  Job:  [id number]

## 2012-06-30 ENCOUNTER — Observation Stay (HOSPITAL_COMMUNITY)
Admission: EM | Admit: 2012-06-30 | Discharge: 2012-07-01 | Disposition: A | Discharge status: Home / Self Care | Payer: Medicare Other | Attending: Internal Medicine | Admitting: Internal Medicine

## 2012-06-30 ENCOUNTER — Encounter (HOSPITAL_COMMUNITY): Payer: Self-pay | Admitting: Emergency Medicine

## 2012-06-30 DIAGNOSIS — I10 Essential (primary) hypertension: Secondary | ICD-10-CM | POA: Diagnosis present

## 2012-06-30 DIAGNOSIS — D751 Secondary polycythemia: Secondary | ICD-10-CM

## 2012-06-30 DIAGNOSIS — E230 Hypopituitarism: Secondary | ICD-10-CM | POA: Diagnosis present

## 2012-06-30 DIAGNOSIS — E785 Hyperlipidemia, unspecified: Secondary | ICD-10-CM

## 2012-06-30 DIAGNOSIS — Z7982 Long term (current) use of aspirin: Secondary | ICD-10-CM | POA: Insufficient documentation

## 2012-06-30 DIAGNOSIS — I2510 Atherosclerotic heart disease of native coronary artery without angina pectoris: Secondary | ICD-10-CM | POA: Diagnosis present

## 2012-06-30 DIAGNOSIS — I2119 ST elevation (STEMI) myocardial infarction involving other coronary artery of inferior wall: Secondary | ICD-10-CM

## 2012-06-30 DIAGNOSIS — E876 Hypokalemia: Principal | ICD-10-CM | POA: Diagnosis present

## 2012-06-30 DIAGNOSIS — K219 Gastro-esophageal reflux disease without esophagitis: Secondary | ICD-10-CM

## 2012-06-30 DIAGNOSIS — I498 Other specified cardiac arrhythmias: Secondary | ICD-10-CM

## 2012-06-30 HISTORY — DX: Disorder of thyroid, unspecified: E07.9

## 2012-06-30 HISTORY — DX: Essential (primary) hypertension: I10

## 2012-06-30 HISTORY — DX: Abdominal aortic aneurysm, without rupture, unspecified: I71.40

## 2012-06-30 HISTORY — DX: Hypopituitarism: E23.0

## 2012-06-30 HISTORY — DX: Abdominal aortic aneurysm, without rupture: I71.4

## 2012-06-30 HISTORY — DX: Atherosclerotic heart disease of native coronary artery without angina pectoris: I25.10

## 2012-06-30 HISTORY — DX: Atherosclerosis of renal artery: I70.1

## 2012-06-30 LAB — BASIC METABOLIC PANEL
Chloride: 97 mEq/L (ref 96–112)
Creatinine, Ser: 1.32 mg/dL (ref 0.50–1.35)
GFR calc Af Amer: 64 mL/min — ABNORMAL LOW (ref >90)
Potassium: 2.2 mEq/L — CL (ref 3.5–5.1)
Sodium: 140 mEq/L (ref 135–145)

## 2012-06-30 LAB — POCT I-STAT, CHEM 8
BUN: 13 mg/dL (ref 6–23)
Chloride: 98 mEq/L (ref 96–112)
Sodium: 140 mEq/L (ref 135–145)
TCO2: 30 mmol/L (ref 0–100)

## 2012-06-30 MED ORDER — POTASSIUM CHLORIDE CRYS ER 20 MEQ PO TBCR
40.0000 meq | EXTENDED_RELEASE_TABLET | Freq: Once | ORAL | Status: AC
  Administered 2012-06-30: 40 meq via ORAL
  Filled 2012-06-30: qty 2

## 2012-06-30 MED ORDER — POTASSIUM CHLORIDE ER 10 MEQ PO TBCR: EXTENDED_RELEASE_TABLET | ORAL | Status: DC

## 2012-06-30 MED ORDER — POTASSIUM CHLORIDE 10 MEQ/100ML IV SOLN
10.0000 meq | INTRAVENOUS | Status: AC
  Administered 2012-06-30 (×3): 10 meq via INTRAVENOUS
  Filled 2012-06-30 (×3): qty 100

## 2012-06-30 NOTE — ED Provider Notes (Addendum)
History     CSN: [id number]  Arrival date & time 06/30/12  1746   First MD Initiated Contact with Patient 06/30/12 1811      Chief Complaint  Patient presents with  . Labs Only    (Consider location/radiation/quality/duration/timing/severity/associated sxs/prior treatment) HPI  Patient reports he went to his doctor for routine visit 4 days ago and had routine blood work done. He relates he was called the following day and told his potassium was low at 2.7 and was told to come to the emergency department, however he didn't want to come. Finally his family insisted he come tonight. He states he feels fine, no weakness, no cramping. His only complaint is he is having prostate problems and has difficulty urinating.   PCP [facility]  Past Medical History  Diagnosis Date  . Coronary artery disease   . Hypertension   . Thyroid disease   . Hypopituitarism     Past Surgical History  Procedure Date  . Cardiac surgery   . Stent in kidney     History reviewed. No pertinent family history.  History  Substance Use Topics  . Smoking status: Heavy Tobacco Smoker -- 1.0 packs/day    Types: Cigarettes  . Smokeless tobacco: Not on file  . Alcohol Use: No   Lives at home   Review of Systems  All other systems reviewed and are negative.    Allergies  Review of patient's allergies indicates no known allergies.  Home Medications   Current Outpatient Rx  Name  Route  Sig  Dispense  Refill  . ALPRAZOLAM 0.5 MG PO TABS   Oral   Take 0.5 mg by mouth 2 (two) times daily.         . ASPIRIN EC 81 MG PO TBEC   Oral   Take 81 mg by mouth daily.         Marland Kitchen CARVEDILOL 6.25 MG PO TABS   Oral   Take 6.25 mg by mouth 2 (two) times daily.         Marland Kitchen CITALOPRAM HYDROBROMIDE 20 MG PO TABS   Oral   Take 20 mg by mouth daily.         Marland Kitchen CLOPIDOGREL BISULFATE 75 MG PO TABS   Oral   Take 75 mg by mouth daily.         Marland Kitchen ESOMEPRAZOLE MAGNESIUM 40 MG PO CPDR   Oral   Take 40  mg by mouth daily before breakfast.         . FLUDROCORTISONE ACETATE 0.1 MG PO TABS   Oral   Take 0.2 mg by mouth 2 (two) times daily.         Marland Kitchen HYDROCHLOROTHIAZIDE 25 MG PO TABS   Oral   Take 25 mg by mouth daily.    States he has been on it for a long time     . LEVOTHYROXINE SODIUM 125 MCG PO TABS   Oral   Take 125 mcg by mouth daily.         Marland Kitchen LISINOPRIL 20 MG PO TABS   Oral   Take 20 mg by mouth daily.         Marland Kitchen PREDNISONE 5 MG PO TABS   Oral   Take 7.5 mg by mouth daily.         Marland Kitchen SIMVASTATIN 40 MG PO TABS   Oral   Take 40 mg by mouth every evening.         Marland Kitchen  TAMSULOSIN HCL 0.4 MG PO CAPS   Oral   Take 0.4 mg by mouth daily.         . TESTOSTERONE CYPIONATE 100 MG/ML IM OIL   Intramuscular   Inject 400 mg into the muscle every 14 (fourteen) days. For IM use only           BP 147/81  Pulse 82  Temp 98.4 F (36.9 C) (Oral)  Resp 15  SpO2 96%  Vital signs normal    Physical Exam  Nursing note and vitals reviewed. Constitutional: He is oriented to person, place, and time. He appears well-developed and well-nourished.  Non-toxic appearance. He does not appear ill. No distress.  HENT:  Head: Normocephalic and atraumatic.  Right Ear: External ear normal.  Left Ear: External ear normal.  Nose: Nose normal. No mucosal edema or rhinorrhea.  Mouth/Throat: Oropharynx is clear and moist and mucous membranes are normal. No dental abscesses or uvula swelling.  Eyes: Conjunctivae normal and EOM are normal. Pupils are equal, round, and reactive to light.  Neck: Normal range of motion and full passive range of motion without pain. Neck supple.  Cardiovascular: Normal rate, regular rhythm and normal heart sounds.  Exam reveals no gallop and no friction rub.   No murmur heard. Pulmonary/Chest: Effort normal and breath sounds normal. No respiratory distress. He has no wheezes. He has no rhonchi. He has no rales. He exhibits no tenderness and no crepitus.   Abdominal: Normal appearance and bowel sounds are normal.  Musculoskeletal: Normal range of motion. He exhibits no edema and no tenderness.       Moves all extremities well.   Neurological: He is alert and oriented to person, place, and time. He has normal strength. No cranial nerve deficit.  Skin: Skin is warm, dry and intact. No rash noted. No erythema. No pallor.  Psychiatric: He has a normal mood and affect. His speech is normal and behavior is normal. His mood appears not anxious.    ED Course  Procedures (including critical care time)    Medications  potassium chloride 10 mEq in 100 mL IVPB (10 mEq Intravenous New Bag/Given 06/30/12 2036)  potassium chloride SA (K-DUR,KLOR-CON) CR tablet 40 mEq (40 mEq Oral Given 06/30/12 1935)   Pt doesn't want to be admitted, is agreeable to get IV KCL 10 meq x 3 and oral. Pt placed on cardiac monitor.   Pt left with Dr Weldon Inches at change of shift to finish his potassium runs.   Results for orders placed during the hospital encounter of 06/30/12  BASIC METABOLIC PANEL      Component Value Range   Sodium 140  135 - 145 mEq/L   Potassium 2.2 (*) 3.5 - 5.1 mEq/L   Chloride 97  96 - 112 mEq/L   CO2 31  19 - 32 mEq/L   Glucose, Bld 121 (*) 70 - 99 mg/dL   BUN 15  6 - 23 mg/dL   Creatinine, Ser 1.61  0.50 - 1.35 mg/dL   Calcium 9.8  8.4 - 09.6 mg/dL   GFR calc non Af Amer 55 (*) >90 mL/min   GFR calc Af Amer 64 (*) >90 mL/min   Laboratory interpretation all normal except hypokalemia     Date: 06/30/2012  Rate: 67  Rhythm: normal sinus rhythm  QRS Axis: normal  Intervals: normal  ST/T Wave abnormalities: nonspecific T wave changes  Conduction Disutrbances:none  Narrative Interpretation: LAE  Old EKG Reviewed: NSCS 03/22/2009  1. Hypokalemia   2. Diuretic-induced hypokalemia     New Prescriptions   POTASSIUM CHLORIDE (K-DUR) 10 MEQ TABLET    Take 2 tabs po BID x 3 days then 1 tab po QD    Plan discharge  Devoria Albe,  MD, FACEP   MDM     CRITICAL CARE Performed by: Devoria Albe L   Total critical care time: 34 min   Critical care time was exclusive of separately billable procedures and treating other patients.  Critical care was necessary to treat or prevent imminent or life-threatening deterioration.  Critical care was time spent personally by me on the following activities: development of treatment plan with patient and/or surrogate as well as nursing, discussions with consultants, evaluation of patient's response to treatment, examination of patient, obtaining history from patient or surrogate, ordering and performing treatments and interventions, ordering and review of laboratory studies, ordering and review of radiographic studies, pulse oximetry and re-evaluation of patient's condition.      Ward Givens, MD 06/30/12 2125  Ward Givens, MD 06/30/12 7253  Ward Givens, MD 06/30/12 2213

## 2012-06-30 NOTE — ED Notes (Signed)
Pt was sent from [facility] for Potassium of 2.7. Pt denies any physical symptoms.

## 2012-07-01 ENCOUNTER — Encounter (HOSPITAL_COMMUNITY): Payer: Self-pay | Admitting: Internal Medicine

## 2012-07-01 DIAGNOSIS — E230 Hypopituitarism: Secondary | ICD-10-CM | POA: Diagnosis present

## 2012-07-01 DIAGNOSIS — E876 Hypokalemia: Secondary | ICD-10-CM | POA: Diagnosis present

## 2012-07-01 LAB — CBC
MCH: 29.8 pg (ref 26.0–34.0)
MCV: 86.6 fL (ref 78.0–100.0)
Platelets: 176 10*3/uL (ref 150–400)
RDW: 14.6 % (ref 11.5–15.5)
WBC: 7.6 10*3/uL (ref 4.0–10.5)

## 2012-07-01 LAB — COMPREHENSIVE METABOLIC PANEL
AST: 15 U/L (ref 0–37)
Albumin: 2.9 g/dL — ABNORMAL LOW (ref 3.5–5.2)
Alkaline Phosphatase: 64 U/L (ref 39–117)
BUN: 12 mg/dL (ref 6–23)
Chloride: 101 mEq/L (ref 96–112)
Potassium: 2.5 mEq/L — CL (ref 3.5–5.1)
Total Bilirubin: 0.3 mg/dL (ref 0.3–1.2)

## 2012-07-01 LAB — MAGNESIUM: Magnesium: 2.1 mg/dL (ref 1.5–2.5)

## 2012-07-01 MED ORDER — CLOPIDOGREL BISULFATE 75 MG PO TABS
75.0000 mg | ORAL_TABLET | Freq: Every day | ORAL | Status: DC
  Administered 2012-07-01: 75 mg via ORAL
  Filled 2012-07-01: qty 1

## 2012-07-01 MED ORDER — ALBUTEROL SULFATE (5 MG/ML) 0.5% IN NEBU: 2.5000 mg | INHALATION_SOLUTION | RESPIRATORY_TRACT | Status: DC | PRN

## 2012-07-01 MED ORDER — CARVEDILOL 3.125 MG PO TABS
6.2500 mg | ORAL_TABLET | Freq: Two times a day (BID) | ORAL | Status: DC
  Administered 2012-07-01: 6.25 mg via ORAL
  Filled 2012-07-01: qty 2

## 2012-07-01 MED ORDER — ONDANSETRON HCL 4 MG PO TABS: 4.0000 mg | ORAL_TABLET | Freq: Four times a day (QID) | ORAL | Status: DC | PRN

## 2012-07-01 MED ORDER — POTASSIUM CHLORIDE CRYS ER 20 MEQ PO TBCR
40.0000 meq | EXTENDED_RELEASE_TABLET | Freq: Once | ORAL | Status: AC
  Administered 2012-07-01: 40 meq via ORAL
  Filled 2012-07-01: qty 2

## 2012-07-01 MED ORDER — SIMVASTATIN 20 MG PO TABS: 40.0000 mg | ORAL_TABLET | Freq: Every evening | ORAL | Status: DC

## 2012-07-01 MED ORDER — LOPERAMIDE HCL 2 MG PO CAPS: 2.0000 mg | ORAL_CAPSULE | ORAL | Status: DC | PRN

## 2012-07-01 MED ORDER — TAMSULOSIN HCL 0.4 MG PO CAPS
0.4000 mg | ORAL_CAPSULE | Freq: Every day | ORAL | Status: DC
  Administered 2012-07-01: 0.4 mg via ORAL
  Filled 2012-07-01: qty 1

## 2012-07-01 MED ORDER — ALPRAZOLAM 0.5 MG PO TABS
0.5000 mg | ORAL_TABLET | Freq: Two times a day (BID) | ORAL | Status: DC
Start: 2012-07-01 — End: 2012-07-01
  Administered 2012-07-01: 0.5 mg via ORAL
  Filled 2012-07-01: qty 1

## 2012-07-01 MED ORDER — ACETAMINOPHEN 650 MG RE SUPP: 650.0000 mg | Freq: Four times a day (QID) | RECTAL | Status: DC | PRN

## 2012-07-01 MED ORDER — CITALOPRAM HYDROBROMIDE 20 MG PO TABS
20.0000 mg | ORAL_TABLET | Freq: Every day | ORAL | Status: DC
  Administered 2012-07-01: 20 mg via ORAL
  Filled 2012-07-01: qty 1

## 2012-07-01 MED ORDER — ENOXAPARIN SODIUM 40 MG/0.4ML ~~LOC~~ SOLN: 40.0000 mg | SUBCUTANEOUS | Status: DC

## 2012-07-01 MED ORDER — ACETAMINOPHEN 325 MG PO TABS
650.0000 mg | ORAL_TABLET | Freq: Four times a day (QID) | ORAL | Status: DC | PRN
  Administered 2012-07-01: 650 mg via ORAL
  Filled 2012-07-01: qty 2

## 2012-07-01 MED ORDER — POTASSIUM CHLORIDE 10 MEQ/100ML IV SOLN
INTRAVENOUS | Status: AC
  Administered 2012-07-01: 10 meq via INTRAVENOUS
  Filled 2012-07-01: qty 200

## 2012-07-01 MED ORDER — LISINOPRIL 10 MG PO TABS
20.0000 mg | ORAL_TABLET | Freq: Every day | ORAL | Status: DC
  Administered 2012-07-01: 20 mg via ORAL
  Filled 2012-07-01: qty 2

## 2012-07-01 MED ORDER — POTASSIUM CHLORIDE IN NACL 20-0.9 MEQ/L-% IV SOLN
INTRAVENOUS | Status: DC
  Administered 2012-07-01: 04:00:00 via INTRAVENOUS

## 2012-07-01 MED ORDER — POTASSIUM CHLORIDE ER 10 MEQ PO TBCR: 20.0000 meq | EXTENDED_RELEASE_TABLET | Freq: Every day | ORAL | Status: DC

## 2012-07-01 MED ORDER — PANTOPRAZOLE SODIUM 40 MG PO TBEC
40.0000 mg | DELAYED_RELEASE_TABLET | Freq: Every day | ORAL | Status: DC
  Administered 2012-07-01: 40 mg via ORAL
  Filled 2012-07-01: qty 1

## 2012-07-01 MED ORDER — ASPIRIN EC 81 MG PO TBEC
81.0000 mg | DELAYED_RELEASE_TABLET | Freq: Every day | ORAL | Status: DC
  Administered 2012-07-01: 81 mg via ORAL
  Filled 2012-07-01: qty 1

## 2012-07-01 MED ORDER — PREDNISONE 5 MG PO TABS
7.5000 mg | ORAL_TABLET | Freq: Every day | ORAL | Status: DC
  Administered 2012-07-01: 7.5 mg via ORAL
  Filled 2012-07-01: qty 3
  Filled 2012-07-01 (×2): qty 1.5

## 2012-07-01 MED ORDER — POTASSIUM CHLORIDE 10 MEQ/100ML IV SOLN
10.0000 meq | INTRAVENOUS | Status: AC
  Administered 2012-07-01 (×4): 10 meq via INTRAVENOUS
  Filled 2012-07-01 (×2): qty 100

## 2012-07-01 MED ORDER — ONDANSETRON HCL 4 MG/2ML IJ SOLN: 4.0000 mg | Freq: Four times a day (QID) | INTRAMUSCULAR | Status: DC | PRN

## 2012-07-01 MED ORDER — LOPERAMIDE HCL 2 MG PO CAPS
4.0000 mg | ORAL_CAPSULE | Freq: Once | ORAL | Status: AC
  Administered 2012-07-01: 4 mg via ORAL
  Filled 2012-07-01: qty 2

## 2012-07-01 MED ORDER — LEVOTHYROXINE SODIUM 25 MCG PO TABS
125.0000 ug | ORAL_TABLET | Freq: Every day | ORAL | Status: DC
  Administered 2012-07-01: 125 ug via ORAL
  Filled 2012-07-01: qty 1

## 2012-07-01 MED ORDER — FLUDROCORTISONE ACETATE 0.1 MG PO TABS
0.2000 mg | ORAL_TABLET | Freq: Two times a day (BID) | ORAL | Status: DC
  Administered 2012-07-01: 0.2 mg via ORAL
  Filled 2012-07-01 (×6): qty 2

## 2012-07-01 NOTE — H&P (Signed)
[facility] History and Physical  MARSALIS BEAULIEU ZOX:[id number] DOB: [birth year] DOA: 06/30/2012   PCP: Rush Barer, PA  Specialists: He has a cardiologist that he follows with in [facility]. He supposed to see endocrinologist, in [place]  Chief Complaint: Low potassium  HPI: Mathew Rodriguez is a 66 y.o. male with a past medical history of coronary artery disease, hypertension, hypopituitarism, who was in his usual state of health when he got a call from his doctor's office that his potassium was low. He was asked to come to the emergency department. Apparently, he had blood work done on November 5, was found to have low potassium and he was asked to go to the emergency department on that day however, patient did not follow that recommendation. Patient otherwise denies any complaints. He denies any chest pain, nausea, vomiting, or diarrhea. He tells me that he was put on 3 blood pressure medications recently including the hydrochlorothiazide. He is unable to tell exactly when this happened. He does not take any potassium supplementation at home. Denies changes to any of his other medications.  Home Medications: Prior to Admission medications   Medication Sig Start Date End Date Taking? Authorizing Provider  ALPRAZolam Prudy Feeler) 0.5 MG tablet Take 0.5 mg by mouth 2 (two) times daily.   Yes Historical Provider, MD  aspirin EC 81 MG tablet Take 81 mg by mouth daily.   Yes Historical Provider, MD  carvedilol (COREG) 6.25 MG tablet Take 6.25 mg by mouth 2 (two) times daily.   Yes Historical Provider, MD  citalopram (CELEXA) 20 MG tablet Take 20 mg by mouth daily.   Yes Historical Provider, MD  clopidogrel (PLAVIX) 75 MG tablet Take 75 mg by mouth daily.   Yes Historical Provider, MD  esomeprazole (NEXIUM) 40 MG capsule Take 40 mg by mouth daily before breakfast.   Yes Historical Provider, MD  fludrocortisone (FLORINEF) 0.1 MG tablet Take 0.2 mg by mouth 2 (two) times daily.   Yes  Historical Provider, MD  hydrochlorothiazide (HYDRODIURIL) 25 MG tablet Take 25 mg by mouth daily.   Yes Historical Provider, MD  levothyroxine (SYNTHROID, LEVOTHROID) 125 MCG tablet Take 125 mcg by mouth daily.   Yes Historical Provider, MD  lisinopril (PRINIVIL,ZESTRIL) 20 MG tablet Take 20 mg by mouth daily.   Yes Historical Provider, MD  predniSONE (DELTASONE) 5 MG tablet Take 7.5 mg by mouth daily.   Yes Historical Provider, MD  simvastatin (ZOCOR) 40 MG tablet Take 40 mg by mouth every evening.   Yes Historical Provider, MD  Tamsulosin HCl (FLOMAX) 0.4 MG CAPS Take 0.4 mg by mouth daily.   Yes Historical Provider, MD  testosterone cypionate (DEPOTESTOTERONE CYPIONATE) 100 MG/ML injection Inject 400 mg into the muscle every 14 (fourteen) days. For IM use only   Yes Historical Provider, MD  potassium chloride (K-DUR) 10 MEQ tablet Take 2 tabs po BID x 3 days then 1 tab po QD 06/30/12   Ward Givens, MD    Allergies: No Known Allergies  Past Medical History: Past Medical History  Diagnosis Date  . Coronary artery disease   . Hypertension   . Thyroid disease   . Hypopituitarism   . Renal artery stenosis     s/p stent  . AAA (abdominal aortic aneurysm)     Past Surgical History  Procedure Date  . Stent in kidney     Social History:  reports that he has been smoking Cigarettes.  He has been smoking about 1 pack  per day. He does not have any smokeless tobacco history on file. He reports that he does not drink alcohol or use illicit drugs.  Living Situation: Lives with his wife in [place] Activity Level: He is independent with his daily activities   Family History: There is history of heart disease in the family  Review of Systems - History obtained from the patient General ROS: negative Psychological ROS: negative Ophthalmic ROS: negative ENT ROS: negative Allergy and Immunology ROS: negative Hematological and Lymphatic ROS: negative Endocrine ROS:  negative Respiratory ROS: no cough, shortness of breath, or wheezing Cardiovascular ROS: no chest pain or dyspnea on exertion Gastrointestinal ROS: no abdominal pain, change in bowel habits, or black or bloody stools Genito-Urinary ROS: no dysuria, trouble voiding, or hematuria Musculoskeletal ROS: negative Neurological ROS: no TIA or stroke symptoms Dermatological ROS: negative  Physical Examination  Filed Vitals:   06/30/12 1802 06/30/12 2128  BP: 147/81 157/91  Pulse: 82 69  Temp: 98.4 F (36.9 C)   TempSrc: Oral   Resp: 15 18  SpO2: 96% 96%    General appearance: alert, cooperative, appears stated age and no distress Head: Normocephalic, without obvious abnormality, atraumatic Eyes: conjunctivae/corneas clear. PERRL, EOM's intact.  Throat: lips, mucosa, and tongue normal; teeth and gums normal Neck: no adenopathy, no carotid bruit, no JVD, supple, symmetrical, trachea midline and thyroid not enlarged, symmetric, no tenderness/mass/nodules Resp: clear to auscultation bilaterally Cardio: regular rate and rhythm, S1, S2 normal, no murmur, click, rub or gallop GI: soft, non-tender; bowel sounds normal; no masses,  no organomegaly Extremities: extremities normal, atraumatic, no cyanosis or edema Pulses: 2+ and symmetric Skin: Skin color, texture, turgor normal. No rashes or lesions Lymph nodes: Cervical, supraclavicular, and axillary nodes normal. Neurologic: Grossly normal  Laboratory Data: Results for orders placed during the hospital encounter of 06/30/12 (from the past 48 hour(s))  BASIC METABOLIC PANEL     Status: Abnormal   Collection Time   06/30/12  6:45 PM      Component Value Range Comment   Sodium 140  135 - 145 mEq/L    Potassium 2.2 (*) 3.5 - 5.1 mEq/L    Chloride 97  96 - 112 mEq/L    CO2 31  19 - 32 mEq/L    Glucose, Bld 121 (*) 70 - 99 mg/dL    BUN 15  6 - 23 mg/dL    Creatinine, Ser 1.19  0.50 - 1.35 mg/dL    Calcium 9.8  8.4 - 14.7 mg/dL    GFR calc  non Af Amer 55 (*) >90 mL/min    GFR calc Af Amer 64 (*) >90 mL/min   POCT I-STAT, CHEM 8     Status: Abnormal   Collection Time   06/30/12 10:51 PM      Component Value Range Comment   Sodium 140  135 - 145 mEq/L    Potassium 2.5 (*) 3.5 - 5.1 mEq/L    Chloride 98  96 - 112 mEq/L    BUN 13  6 - 23 mg/dL    Creatinine, Ser 8.29  0.50 - 1.35 mg/dL    Glucose, Bld 562 (*) 70 - 99 mg/dL    Calcium, Ion 1.30 (*) 1.13 - 1.30 mmol/L    TCO2 30  0 - 100 mmol/L    Hemoglobin 15.0  13.0 - 17.0 g/dL    HCT 86.5  78.4 - 69.6 %     Radiology Reports: No results found.  Electrocardiogram: Independently reviewed .  Sinus rhythm at 67 beats per minute. Normal axis. Normal Intervals. No Q waves. Nonspecific T wave, changes in the in the inferior leads.  Assessment/Plan  Principal Problem:  *Hypokalemia Active Problems:  Essential hypertension, benign  CORONARY ATHEROSCLEROSIS NATIVE CORONARY ARTERY  Hypopituitarism   #1 severe hypokalemia: This is most likely secondary to the diuretic, hydrochlorothiazide that was initiated recently. Patient does not report any GI illness recently. It is unlikely, that his hypopituitarism is playing a role at this time. We will aggressively replete his potassium. Magnesium level will be checked. Potassium level will be repeated today, and it's likely that he may be able to go home later this evening if his potassium level improves.  #2 history of coronary artery disease, status post MI and stents in the past: This is stable. Continue with home medications  #3 history of hypertension: This appears to be stable. Continue to monitor blood pressure.  #4 history of hypopituitarism: Continue with his fludrocortisone, and prednisone and levothyroxine. He, apparently is supposed to see an endocrinologist in the near future.  DVT, prophylaxis will be ordered.  Code Status: He is a full code Family Communication: His wife was at the bedside  Disposition Plan: He  will likely return home when his potassium level is normalized    [facility]  [facility] Pager [phone]  If 7PM-7AM, please contact night-coverage [email or web address] Password [email or web address]  07/01/2012, 1:13 AM

## 2012-07-01 NOTE — Progress Notes (Signed)
UR Chart Review Completed  

## 2012-07-01 NOTE — ED Provider Notes (Signed)
Assumed care from Dr. Lynelle Doctor.  Reviewed the chart and reevaluated the patient.  I agree with her assessment and plan.  The patient's potassium has only come up to 2.5 despite multiple rounds of IV potassium and oral potassium.  Therefore, we will admit him to the hospital for further replacement of potassium.  The patient had refused to be admitted previously, but now.  He is accepted admission realizing that the consequences of inadequate treatment of potassium could be life threatening.  I spoke with the [facility], and he will admit the patient to the hospital for potassium replacement  Cheri Guppy, MD 07/01/12 0007

## 2012-07-01 NOTE — Progress Notes (Signed)
CRITICAL VALUE ALERT  Critical value received:  Potassium 2.5  Date of notification:  07/01/12  Time of notification:  0645  Critical value read back:yes  Nurse who received alert:  Consuello Masse, RN  MD notified (1st page): not paged  Time of first page:  Not paged  MD notified (2nd page):  Time of second page:   Critical value has remained the same as previous lab results, MD was not notified. S.Amiley Shishido, RN  Responding MD:  NA  Time MD responded:  NA

## 2012-07-01 NOTE — Discharge Summary (Signed)
Physician Discharge Summary  Patient ID: Mathew Rodriguez MRN: [id number] DOB/AGE: September 18, 1945 66 y.o.  Admit date: 06/30/2012 Discharge date: 07/01/2012  Discharge Diagnoses:  Principal Problem:  *Hypokalemia Active Problems:  Essential hypertension, benign  CORONARY ATHEROSCLEROSIS NATIVE CORONARY ARTERY  Hypopituitarism     Medication List     As of 07/01/2012  4:15 PM    TAKE these medications         ALPRAZolam 0.5 MG tablet   Commonly known as: XANAX   Take 0.5 mg by mouth 2 (two) times daily.      aspirin EC 81 MG tablet   Take 81 mg by mouth daily.      carvedilol 6.25 MG tablet   Commonly known as: COREG   Take 6.25 mg by mouth 2 (two) times daily.      citalopram 20 MG tablet   Commonly known as: CELEXA   Take 20 mg by mouth daily.      clopidogrel 75 MG tablet   Commonly known as: PLAVIX   Take 75 mg by mouth daily.      esomeprazole 40 MG capsule   Commonly known as: NEXIUM   Take 40 mg by mouth daily before breakfast.      fludrocortisone 0.1 MG tablet   Commonly known as: FLORINEF   Take 0.2 mg by mouth 2 (two) times daily.      hydrochlorothiazide 25 MG tablet   Commonly known as: HYDRODIURIL   Take 25 mg by mouth daily.      levothyroxine 125 MCG tablet   Commonly known as: SYNTHROID, LEVOTHROID   Take 125 mcg by mouth daily.      lisinopril 20 MG tablet   Commonly known as: PRINIVIL,ZESTRIL   Take 20 mg by mouth daily.      potassium chloride 10 MEQ tablet   Commonly known as: K-DUR   Take 2 tablets (20 mEq total) by mouth daily.      predniSONE 5 MG tablet   Commonly known as: DELTASONE   Take 7.5 mg by mouth daily.      simvastatin 40 MG tablet   Commonly known as: ZOCOR   Take 40 mg by mouth every evening.      Tamsulosin HCl 0.4 MG Caps   Commonly known as: FLOMAX   Take 0.4 mg by mouth daily.      testosterone cypionate 100 MG/ML injection   Commonly known as: DEPOTESTOTERONE CYPIONATE   Inject 400 mg into the  muscle every 14 (fourteen) days. For IM use only            Discharge Orders    Future Orders Please Complete By Expires   Diet - low sodium heart healthy      Activity as tolerated - No restrictions         Follow-up Information    Follow up with CLAGGETT,ELIN, PA. In 1 week. (to check potassium)    Contact information:   [address] [place] [address] [phone]          Disposition:   Discharged Condition:   Consults:    Labs:   Results for orders placed during the hospital encounter of 06/30/12 (from the past 48 hour(s))  BASIC METABOLIC PANEL     Status: Abnormal   Collection Time   06/30/12  6:45 PM      Component Value Range Comment   Sodium 140  135 - 145 mEq/L    Potassium 2.2 (*)  3.5 - 5.1 mEq/L    Chloride 97  96 - 112 mEq/L    CO2 31  19 - 32 mEq/L    Glucose, Bld 121 (*) 70 - 99 mg/dL    BUN 15  6 - 23 mg/dL    Creatinine, Ser 4.09  0.50 - 1.35 mg/dL    Calcium 9.8  8.4 - 81.1 mg/dL    GFR calc non Af Amer 55 (*) >90 mL/min    GFR calc Af Amer 64 (*) >90 mL/min   MAGNESIUM     Status: Normal   Collection Time   06/30/12  6:45 PM      Component Value Range Comment   Magnesium 2.1  1.5 - 2.5 mg/dL   POCT I-STAT, CHEM 8     Status: Abnormal   Collection Time   06/30/12 10:51 PM      Component Value Range Comment   Sodium 140  135 - 145 mEq/L    Potassium 2.5 (*) 3.5 - 5.1 mEq/L    Chloride 98  96 - 112 mEq/L    BUN 13  6 - 23 mg/dL    Creatinine, Ser 9.14  0.50 - 1.35 mg/dL    Glucose, Bld 782 (*) 70 - 99 mg/dL    Calcium, Ion 9.56 (*) 1.13 - 1.30 mmol/L    TCO2 30  0 - 100 mmol/L    Hemoglobin 15.0  13.0 - 17.0 g/dL    HCT 21.3  08.6 - 57.8 %   MRSA PCR SCREENING     Status: Normal   Collection Time   07/01/12  3:54 AM      Component Value Range Comment   MRSA by PCR NEGATIVE  NEGATIVE   COMPREHENSIVE METABOLIC PANEL     Status: Abnormal   Collection Time   07/01/12  5:00 AM      Component Value Range Comment   Sodium 141   135 - 145 mEq/L    Potassium 2.5 (*) 3.5 - 5.1 mEq/L    Chloride 101  96 - 112 mEq/L    CO2 28  19 - 32 mEq/L    Glucose, Bld 144 (*) 70 - 99 mg/dL    BUN 12  6 - 23 mg/dL    Creatinine, Ser 4.69  0.50 - 1.35 mg/dL    Calcium 8.6  8.4 - 62.9 mg/dL    Total Protein 6.2  6.0 - 8.3 g/dL    Albumin 2.9 (*) 3.5 - 5.2 g/dL    AST 15  0 - 37 U/L    ALT 18  0 - 53 U/L    Alkaline Phosphatase 64  39 - 117 U/L    Total Bilirubin 0.3  0.3 - 1.2 mg/dL    GFR calc non Af Amer 59 (*) >90 mL/min    GFR calc Af Amer 69 (*) >90 mL/min   CBC     Status: Normal   Collection Time   07/01/12  5:00 AM      Component Value Range Comment   WBC 7.6  4.0 - 10.5 K/uL    RBC 5.07  4.22 - 5.81 MIL/uL    Hemoglobin 15.1  13.0 - 17.0 g/dL    HCT 52.8  41.3 - 24.4 %    MCV 86.6  78.0 - 100.0 fL    MCH 29.8  26.0 - 34.0 pg    MCHC 34.4  30.0 - 36.0 g/dL    RDW 01.0  27.2 - 53.6 %  Platelets 176  150 - 400 K/uL   POTASSIUM     Status: Abnormal   Collection Time   07/01/12  3:16 PM      Component Value Range Comment   Potassium 3.2 (*) 3.5 - 5.1 mEq/L DELTA CHECK NOTED   Full Code   Hospital Course: The patient is a 66 -year-old male with hypertension who presents started the emergency room with hypokalemia. He had outpatient blood work drawn approximately one week prior to admission. His potassium was reportedly low and he was told to come to the emergency room. He did not do so for a week. When he arrived here, his potassium was 2.2. He was placed on observation, given oral and IV repletion. At the time of discharge his potassium is 3.2. He is otherwise medically stable. He will need to followup with his primary care provider to check potassium next week.  Discharge Exam:  Blood pressure 113/69, pulse 57, temperature 98 F (36.7 C), temperature source Oral, resp. rate 20, height 6\' 1"  (1.854 m), weight 91.2 kg (201 lb 1 oz), SpO2 97.00%.  Comfortable. Lungs CTA without WRR. CV without MGR. Abd S, NT,  ND  Signed: Christiane Ha 07/01/2012, 4:15 PM

## 2012-07-01 NOTE — Progress Notes (Signed)
Patient discharged to home

## 2012-07-02 NOTE — Progress Notes (Signed)
Ur chart review completed.  

## 2013-04-08 ENCOUNTER — Emergency Department (HOSPITAL_COMMUNITY)
Admission: EM | Admit: 2013-04-08 | Discharge: 2013-04-08 | Disposition: A | Discharge status: Home / Self Care | Payer: Medicare Other | Attending: Emergency Medicine | Admitting: Emergency Medicine

## 2013-04-08 ENCOUNTER — Encounter (HOSPITAL_COMMUNITY): Payer: Self-pay

## 2013-04-08 DIAGNOSIS — R339 Retention of urine, unspecified: Secondary | ICD-10-CM | POA: Insufficient documentation

## 2013-04-08 DIAGNOSIS — Z8679 Personal history of other diseases of the circulatory system: Secondary | ICD-10-CM | POA: Insufficient documentation

## 2013-04-08 DIAGNOSIS — E230 Hypopituitarism: Secondary | ICD-10-CM | POA: Insufficient documentation

## 2013-04-08 DIAGNOSIS — Z9861 Coronary angioplasty status: Secondary | ICD-10-CM | POA: Insufficient documentation

## 2013-04-08 DIAGNOSIS — Z7902 Long term (current) use of antithrombotics/antiplatelets: Secondary | ICD-10-CM | POA: Insufficient documentation

## 2013-04-08 DIAGNOSIS — IMO0002 Reserved for concepts with insufficient information to code with codable children: Secondary | ICD-10-CM | POA: Insufficient documentation

## 2013-04-08 DIAGNOSIS — I10 Essential (primary) hypertension: Secondary | ICD-10-CM | POA: Insufficient documentation

## 2013-04-08 DIAGNOSIS — I252 Old myocardial infarction: Secondary | ICD-10-CM | POA: Insufficient documentation

## 2013-04-08 DIAGNOSIS — Z79899 Other long term (current) drug therapy: Secondary | ICD-10-CM | POA: Insufficient documentation

## 2013-04-08 DIAGNOSIS — F17200 Nicotine dependence, unspecified, uncomplicated: Secondary | ICD-10-CM | POA: Insufficient documentation

## 2013-04-08 DIAGNOSIS — R338 Other retention of urine: Secondary | ICD-10-CM

## 2013-04-08 DIAGNOSIS — E079 Disorder of thyroid, unspecified: Secondary | ICD-10-CM | POA: Insufficient documentation

## 2013-04-08 DIAGNOSIS — I2510 Atherosclerotic heart disease of native coronary artery without angina pectoris: Secondary | ICD-10-CM | POA: Insufficient documentation

## 2013-04-08 HISTORY — DX: Acute myocardial infarction, unspecified: I21.9

## 2013-04-08 LAB — URINALYSIS, ROUTINE W REFLEX MICROSCOPIC
Bilirubin Urine: NEGATIVE
Ketones, ur: NEGATIVE mg/dL
Protein, ur: NEGATIVE mg/dL
Urobilinogen, UA: 0.2 mg/dL (ref 0.0–1.0)

## 2013-04-08 NOTE — ED Notes (Signed)
Pt alert & oriented x4, stable gait. Patient  given discharge instructions, paperwork & prescription(s). Patient verbalized understanding. Pt left department w/ no further questions. 

## 2013-04-08 NOTE — ED Provider Notes (Signed)
CSN: [id number]     Arrival date & time 04/08/13  4098 History     First MD Initiated Contact with Patient 04/08/13 [phone]     Chief Complaint  Patient presents with  . Urinary Retention   (Consider location/radiation/quality/duration/timing/severity/associated sxs/prior Treatment) HPI Patient reports she's been having difficulty urinating for a long time and states he normally just has dribbling. He has been on Flomax also for a long time. He reports his discussed this with his urologist and he is to have a prostate biopsy done on August 20. He is to see his cardiologist at [facility] in 2 days to get a preop evaluation. He reports he has had 9 cardiac stents in the past. He states when he gets his cardiac stents he has a Foley catheter placed however he has never had to wear a catheter to go home in the past. He relates last time he was able to urinate today was before dark. He states he woke up early this morning and had the urge to urinate however he could not urinate at all. He denies any nausea or vomiting. He states he's never not been able to urinate.    PCP Dr Donnie [facility]  ? Dr Ezzard Standing [facility]  Past Medical History  Diagnosis Date  . Coronary artery disease   . Hypertension   . Thyroid disease   . Hypopituitarism   . Renal artery stenosis     s/p stent  . AAA (abdominal aortic aneurysm)   . Myocardial infarction    Past Surgical History  Procedure Laterality Date  . Stent in kidney     No family history on file. History  Substance Use Topics  . Smoking status: Heavy Tobacco Smoker -- 1.00 packs/day    Types: Cigarettes  . Smokeless tobacco: Not on file  . Alcohol Use: No  lives at home Lives alone  Review of Systems  All other systems reviewed and are negative.    Allergies  Review of patient's allergies indicates no known allergies.  Home Medications   Current Outpatient Rx  Name  Route  Sig  Dispense  Refill  . ALPRAZolam (XANAX)  0.5 MG tablet   Oral   Take 0.5 mg by mouth 2 (two) times daily.         Marland Kitchen aspirin EC 81 MG tablet   Oral   Take 81 mg by mouth daily.         . carvedilol (COREG) 6.25 MG tablet   Oral   Take 6.25 mg by mouth 2 (two) times daily.         . citalopram (CELEXA) 20 MG tablet   Oral   Take 20 mg by mouth daily.         . clopidogrel (PLAVIX) 75 MG tablet   Oral   Take 75 mg by mouth daily.         Marland Kitchen esomeprazole (NEXIUM) 40 MG capsule   Oral   Take 40 mg by mouth daily before breakfast.         . hydrochlorothiazide (HYDRODIURIL) 25 MG tablet   Oral   Take 25 mg by mouth daily.         Marland Kitchen levothyroxine (SYNTHROID, LEVOTHROID) 125 MCG tablet   Oral   Take 125 mcg by mouth daily.         Marland Kitchen lisinopril (PRINIVIL,ZESTRIL) 20 MG tablet   Oral   Take 20 mg by mouth daily.         Marland Kitchen  potassium chloride (K-DUR) 10 MEQ tablet   Oral   Take 2 tablets (20 mEq total) by mouth daily.   60 tablet   0   . predniSONE (DELTASONE) 5 MG tablet   Oral   Take 7.5 mg by mouth daily.         . Tamsulosin HCl (FLOMAX) 0.4 MG CAPS   Oral   Take 0.4 mg by mouth daily.         Marland Kitchen testosterone cypionate (DEPOTESTOTERONE CYPIONATE) 100 MG/ML injection   Intramuscular   Inject 400 mg into the muscle every 14 (fourteen) days. For IM use only         . fludrocortisone (FLORINEF) 0.1 MG tablet   Oral   Take 0.2 mg by mouth 2 (two) times daily.         . simvastatin (ZOCOR) 40 MG tablet   Oral   Take 40 mg by mouth every evening.          BP 132/74  Pulse 82  Temp(Src) 98.6 F (37 C) (Oral)  Resp 20  Ht 6' 1.5" (1.867 m)  Wt 185 lb (83.915 kg)  BMI 24.07 kg/m2  SpO2 97%  Vital signs normal   Physical Exam  Nursing note and vitals reviewed. Constitutional: He is oriented to person, place, and time. He appears well-developed and well-nourished.  Non-toxic appearance. He does not appear ill. No distress.  HENT:  Head: Normocephalic and atraumatic.   Right Ear: External ear normal.  Left Ear: External ear normal.  Nose: Nose normal. No mucosal edema or rhinorrhea.  Mouth/Throat: Oropharynx is clear and moist and mucous membranes are normal. No dental abscesses or edematous.  Eyes: Conjunctivae and EOM are normal. Pupils are equal, round, and reactive to light.  Neck: Normal range of motion and full passive range of motion without pain. Neck supple.  Cardiovascular: Normal rate, regular rhythm and normal heart sounds.  Exam reveals no gallop and no friction rub.   No murmur heard. Pulmonary/Chest: Effort normal and breath sounds normal. No respiratory distress. He has no wheezes. He has no rhonchi. He has no rales. He exhibits no tenderness and no crepitus.  Abdominal: Soft. Normal appearance and bowel sounds are normal. He exhibits no distension. There is no tenderness. There is no rebound and no guarding.  Musculoskeletal: Normal range of motion. He exhibits no edema and no tenderness.  Moves all extremities well.   Neurological: He is alert and oriented to person, place, and time. He has normal strength. No cranial nerve deficit.  Skin: Skin is warm, dry and intact. No rash noted. No erythema. No pallor.  Psychiatric: He has a normal mood and affect. His speech is normal and behavior is normal. His mood appears not anxious.    ED Course   Procedures (including critical care time)  Foley catheter inserted by nursing staff and he had 700 cc of urine output, states he feels much better.   Results for orders placed during the hospital encounter of 04/08/13  URINALYSIS, ROUTINE W REFLEX MICROSCOPIC      Result Value Range   Color, Urine YELLOW  YELLOW   APPearance CLEAR  CLEAR   Specific Gravity, Urine 1.015  1.005 - 1.030   pH 6.0  5.0 - 8.0   Glucose, UA NEGATIVE  NEGATIVE mg/dL   Hgb urine dipstick TRACE (*) NEGATIVE   Bilirubin Urine NEGATIVE  NEGATIVE   Ketones, ur NEGATIVE  NEGATIVE mg/dL   Protein, ur NEGATIVE  NEGATIVE  mg/dL   Urobilinogen, UA 0.2  0.0 - 1.0 mg/dL   Nitrite NEGATIVE  NEGATIVE   Leukocytes, UA NEGATIVE  NEGATIVE  URINE MICROSCOPIC-ADD ON      Result Value Range   Squamous Epithelial / LPF RARE  RARE   WBC, UA 0-2  <3 WBC/hpf   RBC / HPF 0-2  <3 RBC/hpf   Bacteria, UA RARE  RARE   Laboratory interpretation all normal   1. Acute urinary retention     Plan discharge   Devoria Albe, MD, FACEP    MDM    Ward Givens, MD 04/08/13 [phone]

## 2013-04-08 NOTE — ED Notes (Signed)
Pt states he has not been able to void all night, states he has had this problem before.  Pt goes to [facility] for same

## 2015-02-19 ENCOUNTER — Encounter (HOSPITAL_COMMUNITY): Payer: Self-pay | Admitting: Emergency Medicine

## 2015-02-19 ENCOUNTER — Observation Stay (HOSPITAL_COMMUNITY)
Admission: EM | Admit: 2015-02-19 | Discharge: 2015-02-23 | Disposition: A | Discharge status: Home / Self Care | Payer: Medicare Other | Attending: Internal Medicine | Admitting: Internal Medicine

## 2015-02-19 ENCOUNTER — Emergency Department (HOSPITAL_COMMUNITY): Payer: Medicare Other

## 2015-02-19 DIAGNOSIS — Z79899 Other long term (current) drug therapy: Secondary | ICD-10-CM | POA: Diagnosis not present

## 2015-02-19 DIAGNOSIS — K5900 Constipation, unspecified: Secondary | ICD-10-CM | POA: Insufficient documentation

## 2015-02-19 DIAGNOSIS — I252 Old myocardial infarction: Secondary | ICD-10-CM | POA: Insufficient documentation

## 2015-02-19 DIAGNOSIS — I701 Atherosclerosis of renal artery: Secondary | ICD-10-CM | POA: Insufficient documentation

## 2015-02-19 DIAGNOSIS — N179 Acute kidney failure, unspecified: Secondary | ICD-10-CM | POA: Diagnosis not present

## 2015-02-19 DIAGNOSIS — Z720 Tobacco use: Secondary | ICD-10-CM | POA: Insufficient documentation

## 2015-02-19 DIAGNOSIS — I714 Abdominal aortic aneurysm, without rupture: Secondary | ICD-10-CM | POA: Diagnosis not present

## 2015-02-19 DIAGNOSIS — E860 Dehydration: Secondary | ICD-10-CM | POA: Diagnosis not present

## 2015-02-19 DIAGNOSIS — Z793 Long term (current) use of hormonal contraceptives: Secondary | ICD-10-CM | POA: Diagnosis not present

## 2015-02-19 DIAGNOSIS — A084 Viral intestinal infection, unspecified: Secondary | ICD-10-CM

## 2015-02-19 DIAGNOSIS — R531 Weakness: Secondary | ICD-10-CM | POA: Insufficient documentation

## 2015-02-19 DIAGNOSIS — E079 Disorder of thyroid, unspecified: Secondary | ICD-10-CM | POA: Insufficient documentation

## 2015-02-19 DIAGNOSIS — N289 Disorder of kidney and ureter, unspecified: Secondary | ICD-10-CM | POA: Insufficient documentation

## 2015-02-19 DIAGNOSIS — I2510 Atherosclerotic heart disease of native coronary artery without angina pectoris: Secondary | ICD-10-CM | POA: Diagnosis not present

## 2015-02-19 DIAGNOSIS — R112 Nausea with vomiting, unspecified: Secondary | ICD-10-CM | POA: Diagnosis present

## 2015-02-19 DIAGNOSIS — E876 Hypokalemia: Secondary | ICD-10-CM | POA: Diagnosis present

## 2015-02-19 DIAGNOSIS — R0602 Shortness of breath: Secondary | ICD-10-CM | POA: Diagnosis not present

## 2015-02-19 DIAGNOSIS — I10 Essential (primary) hypertension: Secondary | ICD-10-CM | POA: Diagnosis not present

## 2015-02-19 DIAGNOSIS — Z7982 Long term (current) use of aspirin: Secondary | ICD-10-CM | POA: Diagnosis not present

## 2015-02-19 DIAGNOSIS — E230 Hypopituitarism: Secondary | ICD-10-CM | POA: Diagnosis not present

## 2015-02-19 DIAGNOSIS — Z9861 Coronary angioplasty status: Secondary | ICD-10-CM | POA: Diagnosis not present

## 2015-02-19 DIAGNOSIS — Z7902 Long term (current) use of antithrombotics/antiplatelets: Secondary | ICD-10-CM | POA: Diagnosis not present

## 2015-02-19 DIAGNOSIS — Z7952 Long term (current) use of systemic steroids: Secondary | ICD-10-CM | POA: Insufficient documentation

## 2015-02-19 LAB — COMPREHENSIVE METABOLIC PANEL
ALBUMIN: 3.6 g/dL (ref 3.5–5.0)
ALK PHOS: 100 U/L (ref 38–126)
ALT: 33 U/L (ref 17–63)
ANION GAP: 12 (ref 5–15)
AST: 49 U/L — ABNORMAL HIGH (ref 15–41)
BUN: 33 mg/dL — AB (ref 6–20)
CALCIUM: 8.5 mg/dL — AB (ref 8.9–10.3)
CHLORIDE: 93 mmol/L — AB (ref 101–111)
CO2: 26 mmol/L (ref 22–32)
Creatinine, Ser: 2.73 mg/dL — ABNORMAL HIGH (ref 0.61–1.24)
GFR calc Af Amer: 26 mL/min — ABNORMAL LOW (ref >60)
GFR, EST NON AFRICAN AMERICAN: 23 mL/min — AB (ref >60)
Glucose, Bld: 110 mg/dL — ABNORMAL HIGH (ref 65–99)
POTASSIUM: 3.9 mmol/L (ref 3.5–5.1)
SODIUM: 131 mmol/L — AB (ref 135–145)
Total Bilirubin: 1.5 mg/dL — ABNORMAL HIGH (ref 0.3–1.2)
Total Protein: 6.8 g/dL (ref 6.5–8.1)

## 2015-02-19 LAB — CBC WITH DIFFERENTIAL/PLATELET
Basophils Absolute: 0.1 10*3/uL (ref 0.0–0.1)
Basophils Relative: 2 % — ABNORMAL HIGH (ref 0–1)
EOS ABS: 0.1 10*3/uL (ref 0.0–0.7)
Eosinophils Relative: 1 % (ref 0–5)
HEMATOCRIT: 48.6 % (ref 39.0–52.0)
Hemoglobin: 16.5 g/dL (ref 13.0–17.0)
LYMPHS ABS: 0.7 10*3/uL (ref 0.7–4.0)
LYMPHS PCT: 14 % (ref 12–46)
MCH: 29.3 pg (ref 26.0–34.0)
MCHC: 34 g/dL (ref 30.0–36.0)
MCV: 86.2 fL (ref 78.0–100.0)
MONO ABS: 0.4 10*3/uL (ref 0.1–1.0)
MONOS PCT: 8 % (ref 3–12)
Neutro Abs: 3.5 10*3/uL (ref 1.7–7.7)
Neutrophils Relative %: 74 % (ref 43–77)
PLATELETS: 105 10*3/uL — AB (ref 150–400)
RBC: 5.64 MIL/uL (ref 4.22–5.81)
RDW: 14.9 % (ref 11.5–15.5)
WBC: 4.7 10*3/uL (ref 4.0–10.5)

## 2015-02-19 LAB — URINALYSIS, ROUTINE W REFLEX MICROSCOPIC
BILIRUBIN URINE: NEGATIVE
GLUCOSE, UA: NEGATIVE mg/dL
Hgb urine dipstick: NEGATIVE
KETONES UR: NEGATIVE mg/dL
Leukocytes, UA: NEGATIVE
NITRITE: NEGATIVE
PROTEIN: 30 mg/dL — AB
Specific Gravity, Urine: 1.02 (ref 1.005–1.030)
Urobilinogen, UA: 0.2 mg/dL (ref 0.0–1.0)
pH: 5.5 (ref 5.0–8.0)

## 2015-02-19 LAB — URINE MICROSCOPIC-ADD ON

## 2015-02-19 LAB — LIPASE, BLOOD: LIPASE: 18 U/L — AB (ref 22–51)

## 2015-02-19 LAB — TROPONIN I: Troponin I: <0.03 ng/mL (ref <0.031)

## 2015-02-19 MED ORDER — ACETAMINOPHEN 650 MG RE SUPP: 650.0000 mg | Freq: Four times a day (QID) | RECTAL | Status: DC | PRN

## 2015-02-19 MED ORDER — SODIUM CHLORIDE 0.9 % IV SOLN
1000.0000 mL | Freq: Once | INTRAVENOUS | Status: AC
  Administered 2015-02-19: 1000 mL via INTRAVENOUS

## 2015-02-19 MED ORDER — ONDANSETRON HCL 4 MG/2ML IJ SOLN
4.0000 mg | Freq: Once | INTRAMUSCULAR | Status: AC
  Administered 2015-02-19: 4 mg via INTRAVENOUS
  Filled 2015-02-19: qty 2

## 2015-02-19 MED ORDER — SODIUM CHLORIDE 0.9 % IV SOLN
1000.0000 mL | INTRAVENOUS | Status: DC
  Administered 2015-02-19: 1000 mL via INTRAVENOUS

## 2015-02-19 MED ORDER — FLUDROCORTISONE ACETATE 0.1 MG PO TABS
ORAL_TABLET | ORAL | Status: AC
  Filled 2015-02-19: qty 2

## 2015-02-19 MED ORDER — SODIUM CHLORIDE 0.9 % IV SOLN
INTRAVENOUS | Status: DC
  Administered 2015-02-19 – 2015-02-23 (×9): via INTRAVENOUS

## 2015-02-19 MED ORDER — ONDANSETRON HCL 4 MG/2ML IJ SOLN
4.0000 mg | Freq: Four times a day (QID) | INTRAMUSCULAR | Status: DC | PRN
  Administered 2015-02-20 – 2015-02-21 (×3): 4 mg via INTRAVENOUS
  Filled 2015-02-19 (×3): qty 2

## 2015-02-19 MED ORDER — SENNOSIDES-DOCUSATE SODIUM 8.6-50 MG PO TABS
1.0000 | ORAL_TABLET | Freq: Every evening | ORAL | Status: DC | PRN
Start: 2015-02-19 — End: 2015-02-23

## 2015-02-19 MED ORDER — TAMSULOSIN HCL 0.4 MG PO CAPS
0.4000 mg | ORAL_CAPSULE | Freq: Every day | ORAL | Status: DC
  Administered 2015-02-20 – 2015-02-23 (×4): 0.4 mg via ORAL
  Filled 2015-02-19 (×4): qty 1

## 2015-02-19 MED ORDER — HEPARIN SODIUM (PORCINE) 5000 UNIT/ML IJ SOLN
5000.0000 [IU] | Freq: Three times a day (TID) | INTRAMUSCULAR | Status: DC
  Administered 2015-02-19 – 2015-02-22 (×6): 5000 [IU] via SUBCUTANEOUS
  Filled 2015-02-19 (×5): qty 1

## 2015-02-19 MED ORDER — CARVEDILOL 3.125 MG PO TABS
6.2500 mg | ORAL_TABLET | Freq: Two times a day (BID) | ORAL | Status: DC
  Administered 2015-02-19 – 2015-02-23 (×8): 6.25 mg via ORAL
  Filled 2015-02-19 (×8): qty 2

## 2015-02-19 MED ORDER — ALPRAZOLAM 0.5 MG PO TABS
0.5000 mg | ORAL_TABLET | Freq: Two times a day (BID) | ORAL | Status: DC
Start: 2015-02-19 — End: 2015-02-23
  Administered 2015-02-19 – 2015-02-23 (×8): 0.5 mg via ORAL
  Filled 2015-02-19 (×9): qty 1

## 2015-02-19 MED ORDER — ACETAMINOPHEN 325 MG PO TABS
650.0000 mg | ORAL_TABLET | Freq: Four times a day (QID) | ORAL | Status: DC | PRN
  Administered 2015-02-20 – 2015-02-21 (×4): 650 mg via ORAL
  Filled 2015-02-19 (×5): qty 2

## 2015-02-19 MED ORDER — PANTOPRAZOLE SODIUM 40 MG PO TBEC
80.0000 mg | DELAYED_RELEASE_TABLET | Freq: Every day | ORAL | Status: DC
  Administered 2015-02-20 – 2015-02-23 (×4): 80 mg via ORAL
  Filled 2015-02-19 (×4): qty 2

## 2015-02-19 MED ORDER — ONDANSETRON HCL 4 MG PO TABS
4.0000 mg | ORAL_TABLET | Freq: Four times a day (QID) | ORAL | Status: DC | PRN
  Administered 2015-02-23: 4 mg via ORAL
  Filled 2015-02-19: qty 1

## 2015-02-19 MED ORDER — SODIUM CHLORIDE 0.9 % IV SOLN
INTRAVENOUS | Status: AC
  Administered 2015-02-19: 16:00:00 via INTRAVENOUS

## 2015-02-19 MED ORDER — CLOPIDOGREL BISULFATE 75 MG PO TABS
75.0000 mg | ORAL_TABLET | Freq: Every day | ORAL | Status: DC
  Administered 2015-02-20 – 2015-02-23 (×4): 75 mg via ORAL
  Filled 2015-02-19 (×4): qty 1

## 2015-02-19 MED ORDER — LEVOTHYROXINE SODIUM 100 MCG PO TABS
100.0000 ug | ORAL_TABLET | Freq: Every day | ORAL | Status: DC
  Administered 2015-02-20 – 2015-02-23 (×4): 100 ug via ORAL
  Filled 2015-02-19 (×4): qty 1

## 2015-02-19 MED ORDER — FLUDROCORTISONE ACETATE 0.1 MG PO TABS
0.2000 mg | ORAL_TABLET | Freq: Two times a day (BID) | ORAL | Status: DC
  Administered 2015-02-19 – 2015-02-23 (×8): 0.2 mg via ORAL
  Filled 2015-02-19 (×10): qty 2

## 2015-02-19 MED ORDER — POTASSIUM CHLORIDE CRYS ER 20 MEQ PO TBCR: 40.0000 meq | EXTENDED_RELEASE_TABLET | ORAL | Status: DC

## 2015-02-19 MED ORDER — NICOTINE 21 MG/24HR TD PT24
21.0000 mg | MEDICATED_PATCH | Freq: Every day | TRANSDERMAL | Status: DC
  Administered 2015-02-19 – 2015-02-22 (×4): 21 mg via TRANSDERMAL
  Filled 2015-02-19 (×5): qty 1

## 2015-02-19 MED ORDER — ASPIRIN EC 81 MG PO TBEC
81.0000 mg | DELAYED_RELEASE_TABLET | Freq: Every day | ORAL | Status: DC
  Administered 2015-02-20 – 2015-02-23 (×4): 81 mg via ORAL
  Filled 2015-02-19 (×4): qty 1

## 2015-02-19 MED ORDER — FINASTERIDE 5 MG PO TABS
5.0000 mg | ORAL_TABLET | Freq: Every day | ORAL | Status: DC
  Administered 2015-02-20 – 2015-02-23 (×4): 5 mg via ORAL
  Filled 2015-02-19 (×5): qty 1

## 2015-02-19 MED ORDER — CITALOPRAM HYDROBROMIDE 20 MG PO TABS
20.0000 mg | ORAL_TABLET | Freq: Every day | ORAL | Status: DC
  Administered 2015-02-20 – 2015-02-23 (×4): 20 mg via ORAL
  Filled 2015-02-19 (×4): qty 1

## 2015-02-19 NOTE — ED Notes (Signed)
Attempted report 

## 2015-02-19 NOTE — ED Notes (Signed)
Patient complaining of weakness and vomiting x 3 days. Family member states patient B/P 69/59 at home. Denies pain at this time.

## 2015-02-19 NOTE — ED Notes (Signed)
Attempted report x 2 

## 2015-02-19 NOTE — H&P (Signed)
[facility]          History and Physical    PCP:   PROVIDER NOT IN SYSTEM   EDP: Mathew Rodriguez, M.D.  Chief Complaint:  Nausea, vomiting, diarrhea, weakness, chills  HPI: Patient is a 69 year old man with history of coronary artery disease status post MI in the past, hypertension, hyperlipidemia as well as pituitary insufficiency who comes into the hospital with the above-mentioned complaints. Patient states that about 3 days ago he started having nausea vomiting and diarrhea. He believes that over the past 2 days he has vomited about 10 times and this had about 5 episodes of diarrhea the symptoms are improving to the point where he has not had any further GI losses today. Today however he felt like he was having chills felt hot and cold, did not actually take his temperature, he also had severe weakness and difficulty getting out of bed which prompted his evaluation in the emergency department. In the ED as he is found to have acute renal failure, signs of clinical dehydration, hyponatremia, we have been asked to admit him for further evaluation and management.  Allergies:  No Known Allergies    Past Medical History  Diagnosis Date  . Coronary artery disease   . Hypertension   . Thyroid disease   . Hypopituitarism   . Renal artery stenosis     s/p stent  . AAA (abdominal aortic aneurysm)   . Myocardial infarction     Past Surgical History  Procedure Laterality Date  . Stent in kidney    . Cardiac stents      Prior to Admission medications   Medication Sig Start Date End Date Taking? Authorizing Provider  ALPRAZolam Duanne Moron) 0.5 MG tablet Take 0.5 mg by mouth 2 (two) times daily.   Yes Historical Provider, MD  aspirin EC 81 MG tablet Take 81 mg by mouth daily.   Yes Historical Provider, MD  carvedilol (COREG) 6.25 MG tablet Take 6.25 mg by mouth 2 (two) times daily.   Yes Historical Provider, MD  citalopram (CELEXA) 20 MG tablet Take 20 mg by mouth  daily.   Yes Historical Provider, MD  clopidogrel (PLAVIX) 75 MG tablet Take 75 mg by mouth daily.   Yes Historical Provider, MD  esomeprazole (NEXIUM) 40 MG capsule Take 40 mg by mouth daily before breakfast.   Yes Historical Provider, MD  finasteride (PROSCAR) 5 MG tablet Take 5 mg by mouth daily.   Yes Historical Provider, MD  fludrocortisone (FLORINEF) 0.1 MG tablet Take 0.2 mg by mouth 2 (two) times daily.   Yes Historical Provider, MD  hydrochlorothiazide (HYDRODIURIL) 25 MG tablet Take 25 mg by mouth daily.   Yes Historical Provider, MD  levothyroxine (SYNTHROID, LEVOTHROID) 100 MCG tablet Take 100 mcg by mouth daily before breakfast.   Yes Historical Provider, MD  lisinopril (PRINIVIL,ZESTRIL) 5 MG tablet Take 5 mg by mouth daily.   Yes Historical Provider, MD  predniSONE (DELTASONE) 5 MG tablet Take 7.5 mg by mouth daily.   Yes Historical Provider, MD  Tamsulosin HCl (FLOMAX) 0.4 MG CAPS Take 0.4 mg by mouth daily.   Yes Historical Provider, MD  testosterone cypionate (DEPOTESTOTERONE CYPIONATE) 100 MG/ML injection Inject 400 mg into the muscle every 14 (fourteen) days. For IM use only   Yes Historical Provider, MD    Social History:  reports that he has been smoking Cigarettes.  He has been  smoking about 1.00 pack per day. He does not have any smokeless tobacco history on file. He reports that he does not drink alcohol or use illicit drugs.  History reviewed. No pertinent family history.  Review of Systems:  Constitutional: Denies  diaphoresis, appetite change and fatigue.  HEENT: Denies photophobia, eye pain, redness, hearing loss, ear pain, congestion, sore throat, rhinorrhea, sneezing, mouth sores, trouble swallowing, neck pain, neck stiffness and tinnitus.   Respiratory: Denies SOB, DOE, cough, chest tightness,  and wheezing.   Cardiovascular: Denies chest pain, palpitations and leg swelling.  Gastrointestinal: Denies nausea, vomiting, abdominal pain, diarrhea, constipation,  blood in stool and abdominal distention.  Genitourinary: Denies dysuria, urgency, frequency, hematuria, flank pain and difficulty urinating.  Endocrine: Denies: hot or cold intolerance, sweats, changes in hair or nails, polyuria, polydipsia. Musculoskeletal: Denies myalgias, back pain, joint swelling, arthralgias and gait problem.  Skin: Denies pallor, rash and wound.  Neurological: Denies dizziness, seizures, syncope, weakness, light-headedness, numbness and headaches.  Hematological: Denies adenopathy. Easy bruising, personal or family bleeding history  Psychiatric/Behavioral: Denies suicidal ideation, mood changes, confusion, nervousness, sleep disturbance and agitation   Physical Exam: Blood pressure 124/61, pulse 77, temperature 98.6 F (37 C), temperature source Oral, resp. rate 24, height 6' (1.829 m), weight 88.905 kg (196 lb), SpO2 97 %. General: Alert, awake, oriented 3, no current distress. HEENT: Normocephalic, atraumatic, pupils equal Rodriguez and reactive to light, check line movements intact, dry mucous membranes with cracked lips and tongue. Neck: Supple, no JVD, no lymphadenopathy, no bruits, no goiter. Cardiovascular: Regular rate and rhythm, no murmurs, rubs or gallops. Lungs: Clear to auscultation bilaterally. Abdomen: Soft, nontender, nondistended, positive bowel sounds, no masses or organomegaly noted. Extremities: No clubbing, cyanosis or edema, positive pulses. Neurologic: Grossly intact and nonfocal.  Labs on Admission:  Results for orders placed or performed during the hospital encounter of 02/19/15 (from the past 48 hour(s))  CBC with Differential     Status: Abnormal   Collection Time: 02/19/15 10:45 AM  Result Value Ref Range   WBC 4.7 4.0 - 10.5 K/uL   RBC 5.64 4.22 - 5.81 MIL/uL   Hemoglobin 16.5 13.0 - 17.0 g/dL   HCT 48.6 39.0 - 52.0 %   MCV 86.2 78.0 - 100.0 fL   MCH 29.3 26.0 - 34.0 pg   MCHC 34.0 30.0 - 36.0 g/dL   RDW 14.9 11.5 - 15.5 %    Platelets 105 (L) 150 - 400 K/uL    Comment: SPECIMEN CHECKED FOR CLOTS PLATELET CLUMPS NOTED ON SMEAR, COUNT APPEARS ADEQUATE    Neutrophils Relative % 74 43 - 77 %   Neutro Abs 3.5 1.7 - 7.7 K/uL   Lymphocytes Relative 14 12 - 46 %   Lymphs Abs 0.7 0.7 - 4.0 K/uL   Monocytes Relative 8 3 - 12 %   Monocytes Absolute 0.4 0.1 - 1.0 K/uL   Eosinophils Relative 1 0 - 5 %   Eosinophils Absolute 0.1 0.0 - 0.7 K/uL   Basophils Relative 2 (H) 0 - 1 %   Basophils Absolute 0.1 0.0 - 0.1 K/uL  Comprehensive metabolic panel     Status: Abnormal   Collection Time: 02/19/15 10:45 AM  Result Value Ref Range   Sodium 131 (L) 135 - 145 mmol/L   Potassium 3.9 3.5 - 5.1 mmol/L   Chloride 93 (L) 101 - 111 mmol/L   CO2 26 22 - 32 mmol/L   Glucose, Bld 110 (H) 65 - 99 mg/dL   BUN  33 (H) 6 - 20 mg/dL   Creatinine, Ser 2.73 (H) 0.61 - 1.24 mg/dL   Calcium 8.5 (L) 8.9 - 10.3 mg/dL   Total Protein 6.8 6.5 - 8.1 g/dL   Albumin 3.6 3.5 - 5.0 g/dL   AST 49 (H) 15 - 41 U/L   ALT 33 17 - 63 U/L   Alkaline Phosphatase 100 38 - 126 U/L   Total Bilirubin 1.5 (H) 0.3 - 1.2 mg/dL   GFR calc non Af Amer 23 (L) >60 mL/min   GFR calc Af Amer 26 (L) >60 mL/min    Comment: (NOTE) The eGFR has been calculated using the CKD EPI equation. This calculation has not been validated in all clinical situations. eGFR's persistently <60 mL/min signify possible Chronic Kidney Disease.    Anion gap 12 5 - 15  Lipase, blood     Status: Abnormal   Collection Time: 02/19/15 10:45 AM  Result Value Ref Range   Lipase 18 (L) 22 - 51 U/L  Troponin I     Status: None   Collection Time: 02/19/15 10:45 AM  Result Value Ref Range   Troponin I <0.03 <0.031 ng/mL    Comment:        NO INDICATION OF MYOCARDIAL INJURY.   Urinalysis, Routine w reflex microscopic (not at [facility])     Status: Abnormal   Collection Time: 02/19/15 12:30 PM  Result Value Ref Range   Color, Urine YELLOW YELLOW   APPearance CLEAR CLEAR   Specific  Gravity, Urine 1.020 1.005 - 1.030   pH 5.5 5.0 - 8.0   Glucose, UA NEGATIVE NEGATIVE mg/dL   Hgb urine dipstick NEGATIVE NEGATIVE   Bilirubin Urine NEGATIVE NEGATIVE   Ketones, ur NEGATIVE NEGATIVE mg/dL   Protein, ur 30 (A) NEGATIVE mg/dL   Urobilinogen, UA 0.2 0.0 - 1.0 mg/dL   Nitrite NEGATIVE NEGATIVE   Leukocytes, UA NEGATIVE NEGATIVE  Urine microscopic-add on     Status: Abnormal   Collection Time: 02/19/15 12:30 PM  Result Value Ref Range   Squamous Epithelial / LPF RARE RARE   WBC, UA 0-2 <3 WBC/hpf   Bacteria, UA RARE RARE   Casts HYALINE CASTS (A) NEGATIVE    Radiological Exams on Admission: Dg Abd Acute W/chest  02/19/2015   CLINICAL DATA:  Vomiting with shortness breath, fever and weakness for 2 days. Initial encounter.  EXAM: DG ABDOMEN ACUTE W/ 1V CHEST  COMPARISON:  Chest radiographs 03/22/2009.  FINDINGS: The heart size and mediastinal contours are normal. The lungs are clear. There is no pleural effusion or pneumothorax. No acute osseous findings are identified.  The bowel gas pattern is nonobstructive. There is no evidence of free intraperitoneal air. Pelvic calcifications are likely phleboliths. The bones appear unremarkable. Telemetry leads overlie the chest and abdomen.  IMPRESSION: No evidence of active cardiopulmonary or abdominal process.   Electronically Signed   By: Richardean Sale M.D.   On: 02/19/2015 11:25    Assessment/Plan Principal Problem:   Acute renal failure Active Problems:   Viral gastroenteritis   Essential hypertension, benign   CORONARY ATHEROSCLEROSIS NATIVE CORONARY ARTERY   Dehydration   ARF (acute renal failure)    Nausea/vomiting/diarrhea -Have already resolved, suspect acute viral gastroenteritis. -No recent antibiotics, as this is already self-limiting do not believe that we need to order a C. difficile PCR or a GI stool pathogen panel.  Acute renal failure -Product of prerenal azotemia with GI losses. -Continue aggressive IV  fluid repletion. -Check renal function  in the morning. -Hold diuretic and ACE inhibitor.  Coronary artery disease -Stable, no current chest pain.  Panhypopituitarism -Continue TSH, prednisone and Florinef.  DVT prophylaxis -Subcutaneous heparin  CODE STATUS  -as discussed with patient and wife at bedside, full code.  Time Spent on Admission: 75 minutes  HERNANDEZ ACOSTA,ESTELA [facility] Pager: [phone] 02/19/2015, 6:34 PM

## 2015-02-19 NOTE — ED Provider Notes (Signed)
CSN: [id number]     Arrival date & time 02/19/15  1034 History  This chart was scribed for Linwood DibblesJon Annalisia Ingber, MD by Placido SouLogan Joldersma, ED scribe. This patient was seen in room APA05/APA05 and the patient's care was started at 10:48 AM.    Chief Complaint  Patient presents with  . Emesis  . Fatigue   The history is provided by the patient and a relative. No language interpreter was used.    HPI Comments: Mathew Rodriguez is a 69 y.o. male, with a history of smoking, who presents to the Emergency Department complaining of constant, moderate, weakness with onset 3 days ago. Pt notes intermittent nausea, vomiting, diarrhea, chills, and SOB as associated symptoms. He notes vomiting 6-8x and about 10x diarrhea. Pt notes not urinating frequently for 2 days due to having trouble keeping water down. Pt's relative notes he been able to feed him a small amount of grapes since the onset of his symptoms. Pt's relative notes that his BP was 65/59 earlier today. He denies any myalgias, hematemesis, bloody stools, abd pain, CP, HA, fevers, dysuria, or any recent changes in skin color.   Past Medical History  Diagnosis Date  . Coronary artery disease   . Hypertension   . Thyroid disease   . Hypopituitarism   . Renal artery stenosis     s/p stent  . AAA (abdominal aortic aneurysm)   . Myocardial infarction    Past Surgical History  Procedure Laterality Date  . Stent in kidney    . Cardiac stents     History reviewed. No pertinent family history. History  Substance Use Topics  . Smoking status: Heavy Tobacco Smoker -- 1.00 packs/day    Types: Cigarettes  . Smokeless tobacco: Not on file  . Alcohol Use: No    Review of Systems  Constitutional: Positive for chills. Negative for fever.  Respiratory: Positive for shortness of breath.   Cardiovascular: Negative for chest pain.  Gastrointestinal: Positive for nausea, vomiting and constipation. Negative for abdominal pain, blood in stool and anal bleeding.   Genitourinary: Positive for decreased urine volume. Negative for dysuria, urgency and frequency.  Musculoskeletal: Negative for myalgias.  Skin: Negative for color change.  Neurological: Positive for weakness. Negative for headaches.  All other systems reviewed and are negative.     Allergies  Review of patient's allergies indicates no known allergies.  Home Medications   Prior to Admission medications   Medication Sig Start Date End Date Taking? Authorizing Provider  ALPRAZolam Prudy Feeler(XANAX) 0.5 MG tablet Take 0.5 mg by mouth 2 (two) times daily.   Yes Historical Provider, MD  aspirin EC 81 MG tablet Take 81 mg by mouth daily.   Yes Historical Provider, MD  carvedilol (COREG) 6.25 MG tablet Take 6.25 mg by mouth 2 (two) times daily.   Yes Historical Provider, MD  citalopram (CELEXA) 20 MG tablet Take 20 mg by mouth daily.   Yes Historical Provider, MD  clopidogrel (PLAVIX) 75 MG tablet Take 75 mg by mouth daily.   Yes Historical Provider, MD  esomeprazole (NEXIUM) 40 MG capsule Take 40 mg by mouth daily before breakfast.   Yes Historical Provider, MD  finasteride (PROSCAR) 5 MG tablet Take 5 mg by mouth daily.   Yes Historical Provider, MD  fludrocortisone (FLORINEF) 0.1 MG tablet Take 0.2 mg by mouth 2 (two) times daily.   Yes Historical Provider, MD  hydrochlorothiazide (HYDRODIURIL) 25 MG tablet Take 25 mg by mouth daily.   Yes Historical Provider,  MD  levothyroxine (SYNTHROID, LEVOTHROID) 100 MCG tablet Take 100 mcg by mouth daily before breakfast.   Yes Historical Provider, MD  lisinopril (PRINIVIL,ZESTRIL) 5 MG tablet Take 5 mg by mouth daily.   Yes Historical Provider, MD  predniSONE (DELTASONE) 5 MG tablet Take 7.5 mg by mouth daily.   Yes Historical Provider, MD  Tamsulosin HCl (FLOMAX) 0.4 MG CAPS Take 0.4 mg by mouth daily.   Yes Historical Provider, MD  testosterone cypionate (DEPOTESTOTERONE CYPIONATE) 100 MG/ML injection Inject 400 mg into the muscle every 14 (fourteen) days.  For IM use only   Yes Historical Provider, MD   BP 104/62 mmHg  Pulse 76  Temp(Src) 98.3 F (36.8 C) (Oral)  Resp 17  Ht 6' (1.829 m)  Wt 193 lb (87.544 kg)  BMI 26.17 kg/m2  SpO2 95% Physical Exam  Constitutional: No distress.  HENT:  Head: Normocephalic and atraumatic.  Right Ear: External ear normal.  Left Ear: External ear normal.  Mucus membranes dry;   Eyes: Conjunctivae are normal. Right eye exhibits no discharge. Left eye exhibits no discharge. No scleral icterus.  Neck: Neck supple. No tracheal deviation present.  Cardiovascular: Normal rate, regular rhythm and intact distal pulses.   Pulmonary/Chest: Effort normal and breath sounds normal. No stridor. No respiratory distress. He has no wheezes. He has no rales.  Abdominal: Soft. Bowel sounds are normal. He exhibits no distension. There is no tenderness. There is no rebound and no guarding.  Musculoskeletal: He exhibits no edema or tenderness.  Neurological: He is alert. He has normal strength. No cranial nerve deficit (no facial droop, extraocular movements intact, no slurred speech) or sensory deficit. He exhibits normal muscle tone. He displays no seizure activity. Coordination normal.  Skin: Skin is warm and dry. No rash noted.  Ruddy complexion; telangectasia;    Psychiatric: He has a normal mood and affect.  Nursing note and vitals reviewed.   ED Course  Procedures  DIAGNOSTIC STUDIES: Oxygen Saturation is 100% on RA, normal by my interpretation.    COORDINATION OF CARE: 10:54 AM Discussed treatment plan with pt at bedside and pt agreed to plan.  Labs Review Labs Reviewed  CBC WITH DIFFERENTIAL/PLATELET - Abnormal; Notable for the following:    Platelets 105 (*)    Basophils Relative 2 (*)    All other components within normal limits  COMPREHENSIVE METABOLIC PANEL - Abnormal; Notable for the following:    Sodium 131 (*)    Chloride 93 (*)    Glucose, Bld 110 (*)    BUN 33 (*)    Creatinine, Ser 2.73  (*)    Calcium 8.5 (*)    AST 49 (*)    Total Bilirubin 1.5 (*)    GFR calc non Af Amer 23 (*)    GFR calc Af Amer 26 (*)    All other components within normal limits  LIPASE, BLOOD - Abnormal; Notable for the following:    Lipase 18 (*)    All other components within normal limits  TROPONIN I  URINALYSIS, ROUTINE W REFLEX MICROSCOPIC (NOT AT [facility])    Imaging Review Dg Abd Acute W/chest  02/19/2015   CLINICAL DATA:  Vomiting with shortness breath, fever and weakness for 2 days. Initial encounter.  EXAM: DG ABDOMEN ACUTE W/ 1V CHEST  COMPARISON:  Chest radiographs 03/22/2009.  FINDINGS: The heart size and mediastinal contours are normal. The lungs are clear. There is no pleural effusion or pneumothorax. No acute osseous findings are identified.  The bowel gas pattern is nonobstructive. There is no evidence of free intraperitoneal air. Pelvic calcifications are likely phleboliths. The bones appear unremarkable. Telemetry leads overlie the chest and abdomen.  IMPRESSION: No evidence of active cardiopulmonary or abdominal process.   Electronically Signed   By: Carey Bullocks M.D.   On: 02/19/2015 11:25   Medications  0.9 %  sodium chloride infusion (1,000 mLs Intravenous New Bag/Given 02/19/15 1101)    Followed by  0.9 %  sodium chloride infusion (1,000 mLs Intravenous New Bag/Given 02/19/15 1101)  ondansetron (ZOFRAN) injection 4 mg (4 mg Intravenous Given 02/19/15 1101)      MDM   Final diagnoses:  Dehydration  Acute renal insufficiency   Patient has acute renal insufficiency. His BUN and creatinine are significantly elevated compared to prior values. This is a result of his vomiting and diarrhea.  Patient has no focal abdominal tenderness. I doubt cholecystitis, appendicitis, bowel obstruction or diverticulitis.  Patient has had some improvement with IV fluids. I will consult the medical service for admission, continued IV hydration, and serial monitoring.  I personally performed the  services described in this documentation, which was scribed in my presence.  The recorded information has been reviewed and is accurate.    Linwood Dibbles, MD 02/19/15 [phone]

## 2015-02-20 DIAGNOSIS — N179 Acute kidney failure, unspecified: Secondary | ICD-10-CM | POA: Diagnosis not present

## 2015-02-20 LAB — BASIC METABOLIC PANEL
Anion gap: 8 (ref 5–15)
BUN: 27 mg/dL — ABNORMAL HIGH (ref 6–20)
CHLORIDE: 104 mmol/L (ref 101–111)
CO2: 22 mmol/L (ref 22–32)
Calcium: 7.5 mg/dL — ABNORMAL LOW (ref 8.9–10.3)
Creatinine, Ser: 1.89 mg/dL — ABNORMAL HIGH (ref 0.61–1.24)
GFR calc Af Amer: 41 mL/min — ABNORMAL LOW (ref >60)
GFR calc non Af Amer: 35 mL/min — ABNORMAL LOW (ref >60)
GLUCOSE: 74 mg/dL (ref 65–99)
POTASSIUM: 3.5 mmol/L (ref 3.5–5.1)
Sodium: 134 mmol/L — ABNORMAL LOW (ref 135–145)

## 2015-02-20 LAB — CBC
HEMATOCRIT: 40.5 % (ref 39.0–52.0)
HEMOGLOBIN: 13.1 g/dL (ref 13.0–17.0)
MCH: 28.1 pg (ref 26.0–34.0)
MCHC: 32.3 g/dL (ref 30.0–36.0)
MCV: 86.7 fL (ref 78.0–100.0)
PLATELETS: 87 10*3/uL — AB (ref 150–400)
RBC: 4.67 MIL/uL (ref 4.22–5.81)
RDW: 15.3 % (ref 11.5–15.5)
WBC: 3 10*3/uL — ABNORMAL LOW (ref 4.0–10.5)

## 2015-02-20 LAB — CLOSTRIDIUM DIFFICILE BY PCR: Toxigenic C. Difficile by PCR: NEGATIVE

## 2015-02-20 NOTE — Progress Notes (Signed)
Nurse tech notified nurse pt was about to take home medications. Upon entering the room, nurse saw pt with medication pills in hand attempting to take meds. Nurse asked to lock medications up safely in pharmacy, but pt refused and stated his granddaughter would take them home. Nurse educated pt. Nurse told pt the pharmacy will dose medications that the MD wants him to take while in the hospital. Pt verbalized understanding and stated he would not take any home meds.

## 2015-02-20 NOTE — Progress Notes (Signed)
Pt had 1 episode of vomiting. Administered zofran. Pt had a temp of 101.6 and complained of chills and pain in abdomen like stomach cramping, administered Tylenol. Will continue to monitor pt.

## 2015-02-20 NOTE — Progress Notes (Signed)
[facility] PROGRESS NOTE  Mathew RenoDelbert W Papa ZOX:[id number] DOB: 1945/10/20 DOA: 02/19/2015 PCP: PROVIDER NOT IN SYSTEM  Assessment/Plan: Nausea/vomiting/diarrhea -Has had return of diarrhea today, although viral gastroenteritis is still a possibility, will [place] PCR and VI stool pathogen panel. (No recent antibiotics.  Acute renal failure -Product of prerenal azotemia with GI losses. -Continue aggressive IV fluid repletion. -Hold diuretic and ACE inhibitor. -Recheck renal function in a.m.  Coronary artery disease -Stable, no chest pain.   Panhypopituitarism -Continue Synthroid, prednisone, Florinef. -See no need for stress dose steroids as he is currently hemodynamically stable.  Tobacco abuse -Nicotine patch, counseled on cessation.  Code Status: Full code Family Communication: Multiple family members at bedside updated on plan of care  Disposition Plan: Anticipate discharge home in 24-48 hours   Consultants:  None   Antibiotics:  None   Subjective: Wants to smoke, has had 3 episodes of diarrhea today.  Objective: Filed Vitals:   02/19/15 1654 02/19/15 2150 02/20/15 0132 02/20/15 0559  BP: 124/61 98/64  91/65  Pulse: 77 74  73  Temp: 98.6 F (37 C) 100.6 F (38.1 C) 99.6 F (37.6 C) 98.3 F (36.8 C)  TempSrc: Oral Oral Oral Oral  Resp: 24 20  16   Height: 6' (1.829 m)     Weight: 88.905 kg (196 lb)     SpO2: 97% 95%  98%    Intake/Output Summary (Last 24 hours) at 02/20/15 1517 Last data filed at 02/20/15 0800  Gross per 24 hour  Intake 1972.5 ml  Output      0 ml  Net 1972.5 ml   Filed Weights   02/19/15 1044 02/19/15 1654  Weight: 87.544 kg (193 lb) 88.905 kg (196 lb)    Exam:   General:  Alert, awake, oriented 3  Cardiovascular: Regular rate and rhythm  Respiratory: Clear to auscultation bilaterally  Abdomen: Soft, nontender, nondistended, positive bowel sounds  Extremities: No clubbing, cyanosis or edema,  positive pulses   Neurologic:  Intact and nonfocal  Data Reviewed: Basic Metabolic Panel:  Recent Labs Lab 02/19/15 1045 02/20/15 0615  NA 131* 134*  K 3.9 3.5  CL 93* 104  CO2 26 22  GLUCOSE 110* 74  BUN 33* 27*  CREATININE 2.73* 1.89*  CALCIUM 8.5* 7.5*   Liver Function Tests:  Recent Labs Lab 02/19/15 1045  AST 49*  ALT 33  ALKPHOS 100  BILITOT 1.5*  PROT 6.8  ALBUMIN 3.6    Recent Labs Lab 02/19/15 1045  LIPASE 18*   No results for input(s): AMMONIA in the last 168 hours. CBC:  Recent Labs Lab 02/19/15 1045 02/20/15 0615  WBC 4.7 3.0*  NEUTROABS 3.5  --   HGB 16.5 13.1  HCT 48.6 40.5  MCV 86.2 86.7  PLT 105* 87*   Cardiac Enzymes:  Recent Labs Lab 02/19/15 1045  TROPONINI <0.03   BNP (last 3 results) No results for input(s): BNP in the last 8760 hours.  ProBNP (last 3 results) No results for input(s): PROBNP in the last 8760 hours.  CBG: No results for input(s): GLUCAP in the last 168 hours.  No results found for this or any previous visit (from the past 240 hour(s)).   Studies: Dg Abd Acute W/chest  02/19/2015   CLINICAL DATA:  Vomiting with shortness breath, fever and weakness for 2 days. Initial encounter.  EXAM: DG ABDOMEN ACUTE W/ 1V CHEST  COMPARISON:  Chest radiographs 03/22/2009.  FINDINGS: The heart size and mediastinal contours are normal. The  lungs are clear. There is no pleural effusion or pneumothorax. No acute osseous findings are identified.  The bowel gas pattern is nonobstructive. There is no evidence of free intraperitoneal air. Pelvic calcifications are likely phleboliths. The bones appear unremarkable. Telemetry leads overlie the chest and abdomen.  IMPRESSION: No evidence of active cardiopulmonary or abdominal process.   Electronically Signed   By: Carey Bullocks M.D.   On: 02/19/2015 11:25    Scheduled Meds: . ALPRAZolam  0.5 mg Oral BID  . aspirin EC  81 mg Oral Daily  . carvedilol  6.25 mg Oral BID WC  .  citalopram  20 mg Oral Daily  . clopidogrel  75 mg Oral Daily  . finasteride  5 mg Oral Daily  . fludrocortisone  0.2 mg Oral BID  . heparin  5,000 Units Subcutaneous 3 times per day  . levothyroxine  100 mcg Oral QAC breakfast  . nicotine  21 mg Transdermal Daily  . pantoprazole  80 mg Oral Daily  . tamsulosin  0.4 mg Oral Daily   Continuous Infusions: . sodium chloride 125 mL/hr at 02/20/15 0840    Principal Problem:   Acute renal failure Active Problems:   Viral gastroenteritis   Essential hypertension, benign   CORONARY ATHEROSCLEROSIS NATIVE CORONARY ARTERY   Dehydration   ARF (acute renal failure)    Time spent: 30 minutes. Greater than 50% of this time was spent in direct contact with the patient coordinating care.    Chaya Jan  [facility] Pager [phone]  If 7PM-7AM, please contact night-coverage at [email or web address], password [facility] 02/20/2015, 3:17 PM  LOS: 1 day

## 2015-02-21 DIAGNOSIS — N179 Acute kidney failure, unspecified: Secondary | ICD-10-CM | POA: Diagnosis not present

## 2015-02-21 LAB — CBC
HEMATOCRIT: 39.5 % (ref 39.0–52.0)
HEMOGLOBIN: 13 g/dL (ref 13.0–17.0)
MCH: 28.4 pg (ref 26.0–34.0)
MCHC: 32.9 g/dL (ref 30.0–36.0)
MCV: 86.4 fL (ref 78.0–100.0)
Platelets: 80 10*3/uL — ABNORMAL LOW (ref 150–400)
RBC: 4.57 MIL/uL (ref 4.22–5.81)
RDW: 15.2 % (ref 11.5–15.5)
WBC: 2.2 10*3/uL — ABNORMAL LOW (ref 4.0–10.5)

## 2015-02-21 LAB — BASIC METABOLIC PANEL
Anion gap: 9 (ref 5–15)
BUN: 16 mg/dL (ref 6–20)
CHLORIDE: 109 mmol/L (ref 101–111)
CO2: 21 mmol/L — ABNORMAL LOW (ref 22–32)
CREATININE: 1.49 mg/dL — AB (ref 0.61–1.24)
Calcium: 7.3 mg/dL — ABNORMAL LOW (ref 8.9–10.3)
GFR, EST AFRICAN AMERICAN: 54 mL/min — AB (ref >60)
GFR, EST NON AFRICAN AMERICAN: 47 mL/min — AB (ref >60)
Glucose, Bld: 75 mg/dL (ref 65–99)
Potassium: 2.8 mmol/L — ABNORMAL LOW (ref 3.5–5.1)
Sodium: 139 mmol/L (ref 135–145)

## 2015-02-21 LAB — MAGNESIUM: Magnesium: 1.8 mg/dL (ref 1.7–2.4)

## 2015-02-21 MED ORDER — LOPERAMIDE HCL 2 MG PO CAPS
2.0000 mg | ORAL_CAPSULE | ORAL | Status: DC | PRN
  Administered 2015-02-21 (×2): 2 mg via ORAL
  Filled 2015-02-21 (×3): qty 1

## 2015-02-21 MED ORDER — POTASSIUM CHLORIDE CRYS ER 20 MEQ PO TBCR
40.0000 meq | EXTENDED_RELEASE_TABLET | ORAL | Status: AC
  Administered 2015-02-21 (×3): 40 meq via ORAL
  Filled 2015-02-21 (×3): qty 2

## 2015-02-21 MED ORDER — ZOLPIDEM TARTRATE 5 MG PO TABS
5.0000 mg | ORAL_TABLET | Freq: Every evening | ORAL | Status: DC | PRN
  Administered 2015-02-21 (×2): 5 mg via ORAL
  Filled 2015-02-21 (×3): qty 1

## 2015-02-21 NOTE — Progress Notes (Addendum)
[facility] PROGRESS NOTE  Mathew Rodriguez YNW:[id number] DOB: 05/17/1946 DOA: 02/19/2015 PCP: PROVIDER NOT IN SYSTEM  Assessment/Plan: Nausea/vomiting/diarrhea/fever -Had fever last p.m., C. difficile PCR negative, rest of stool pathogen panel currently pending.  -Has not been on antibiotics recently, see no reason to start at this time. -Treat symptomatically for now.  Acute renal failure -Product of prerenal azotemia with GI losses. -Continue aggressive IV fluid repletion. -Continue to hold diuretic and ACE inhibitor. -Is improving, creatinine down to 1.4 today, recheck renal function in a.m.  Hypokalemia -Secondary to diarrhea. -Replete orally, check magnesium level.  Coronary artery disease -Stable, no chest pain.  Panhypopituitarism -Continue Synthroid, prednisone, Florinef. -See no need for stress dose steroids as he is currently hemodynamically stable.  Tobacco abuse -Nicotine patch. -Counseled on cessation.  Code Status: Full code Family Communication: Patient only  Disposition Plan: Home when ready, hopeful 48 hours   Consultants:  None   Antibiotics:  None   Subjective: Complains of increased chills and fevers overnight, emesis overnight, no chest pain or shortness of breath.  Objective: Filed Vitals:   02/20/15 0559 02/20/15 1424 02/20/15 2117 02/21/15 0601  BP: 91/65 102/68 178/79 133/61  Pulse: 73 71 74 65  Temp: 98.3 F (36.8 C) 98.5 F (36.9 C) 101.6 F (38.7 C) 98.9 F (37.2 C)  TempSrc: Oral Oral Oral Oral  Resp: Height:      Weight:      SpO2: 98% 98% 98% 95%    Intake/Output Summary (Last 24 hours) at 02/21/15 1146 Last data filed at 02/21/15 0915  Gross per 24 hour  Intake 3001.67 ml  Output   1675 ml  Net 1326.67 ml   Filed Weights   02/19/15 1044 02/19/15 1654  Weight: 87.544 kg (193 lb) 88.905 kg (196 lb)    Exam:   General:  Alert, awake, oriented 3  Cardiovascular: Regular rate and  rhythm, no murmurs, rubs or gallops  Respiratory: Clear to auscultation bilaterally  Abdomen: Soft, nontender, nondistended, positive bowel sounds, no masses or organomegaly noted  Extremities: No clubbing, cyanosis or edema, positive pulses   Neurologic:  Intact, nonfocal  Data Reviewed: Basic Metabolic Panel:  Recent Labs Lab 02/19/15 1045 02/20/15 0615 02/21/15 0605  NA 131* 134* 139  K 3.9 3.5 2.8*  CL 93* 104 109  CO2 26 22 21*  GLUCOSE 110* 74 75  BUN 33* 27* 16  CREATININE 2.73* 1.89* 1.49*  CALCIUM 8.5* 7.5* 7.3*  MG  --   --  1.8   Liver Function Tests:  Recent Labs Lab 02/19/15 1045  AST 49*  ALT 33  ALKPHOS 100  BILITOT 1.5*  PROT 6.8  ALBUMIN 3.6    Recent Labs Lab 02/19/15 1045  LIPASE 18*   No results for input(s): AMMONIA in the last 168 hours. CBC:  Recent Labs Lab 02/19/15 1045 02/20/15 0615 02/21/15 0605  WBC 4.7 3.0* 2.2*  NEUTROABS 3.5  --   --   HGB 16.5 13.1 13.0  HCT 48.6 40.5 39.5  MCV 86.2 86.7 86.4  PLT 105* 87* 80*   Cardiac Enzymes:  Recent Labs Lab 02/19/15 1045  TROPONINI <0.03   BNP (last 3 results) No results for input(s): BNP in the last 8760 hours.  ProBNP (last 3 results) No results for input(s): PROBNP in the last 8760 hours.  CBG: No results for input(s): GLUCAP in the last 168 hours.  Recent Results (from the past 240 hour(s))  Clostridium  Difficile by PCR (not at [facility])     Status: None   Collection Time: 02/20/15  6:20 PM  Result Value Ref Range Status   C difficile by pcr NEGATIVE NEGATIVE Final     Studies: No results found.  Scheduled Meds: . ALPRAZolam  0.5 mg Oral BID  . aspirin EC  81 mg Oral Daily  . carvedilol  6.25 mg Oral BID WC  . citalopram  20 mg Oral Daily  . clopidogrel  75 mg Oral Daily  . finasteride  5 mg Oral Daily  . fludrocortisone  0.2 mg Oral BID  . heparin  5,000 Units Subcutaneous 3 times per day  . levothyroxine  100 mcg Oral QAC breakfast  . nicotine  21  mg Transdermal Daily  . pantoprazole  80 mg Oral Daily  . potassium chloride  40 mEq Oral Q4H  . tamsulosin  0.4 mg Oral Daily   Continuous Infusions: . sodium chloride 125 mL/hr at 02/21/15 [id number]    Principal Problem:   Acute renal failure Active Problems:   Viral gastroenteritis   Essential hypertension, benign   CORONARY ATHEROSCLEROSIS NATIVE CORONARY ARTERY   Dehydration   ARF (acute renal failure)    Time spent: 30 minutes. Greater than 50% of this time was spent in direct contact with the patient coordinating care.    Chaya JanHERNANDEZ ACOSTA,ESTELA  [facility] Pager [phone]  If 7PM-7AM, please contact night-coverage at [email or web address], password [email or web address] 02/21/2015, 11:46 AM  LOS: 2 days

## 2015-02-21 NOTE — Progress Notes (Signed)
Pt requested something to help him sleep. MD ordered Ambien for pt. Administered, and will continue to monitor pt.

## 2015-02-22 DIAGNOSIS — N179 Acute kidney failure, unspecified: Secondary | ICD-10-CM | POA: Diagnosis not present

## 2015-02-22 LAB — CBC
HCT: 39.9 % (ref 39.0–52.0)
Hemoglobin: 13 g/dL (ref 13.0–17.0)
MCH: 28.1 pg (ref 26.0–34.0)
MCHC: 32.6 g/dL (ref 30.0–36.0)
MCV: 86.2 fL (ref 78.0–100.0)
PLATELETS: 79 10*3/uL — AB (ref 150–400)
RBC: 4.63 MIL/uL (ref 4.22–5.81)
RDW: 15.4 % (ref 11.5–15.5)
WBC: 3.1 10*3/uL — ABNORMAL LOW (ref 4.0–10.5)

## 2015-02-22 LAB — BASIC METABOLIC PANEL
ANION GAP: 8 (ref 5–15)
BUN: 9 mg/dL (ref 6–20)
CALCIUM: 7.6 mg/dL — AB (ref 8.9–10.3)
CHLORIDE: 110 mmol/L (ref 101–111)
CO2: 21 mmol/L — AB (ref 22–32)
Creatinine, Ser: 1.31 mg/dL — ABNORMAL HIGH (ref 0.61–1.24)
GFR, EST NON AFRICAN AMERICAN: 55 mL/min — AB (ref >60)
GLUCOSE: 77 mg/dL (ref 65–99)
Potassium: 3.1 mmol/L — ABNORMAL LOW (ref 3.5–5.1)
Sodium: 139 mmol/L (ref 135–145)

## 2015-02-22 LAB — HIV ANTIBODY (ROUTINE TESTING W REFLEX): HIV SCREEN 4TH GENERATION: NONREACTIVE

## 2015-02-22 MED ORDER — POTASSIUM CHLORIDE CRYS ER 20 MEQ PO TBCR
40.0000 meq | EXTENDED_RELEASE_TABLET | Freq: Once | ORAL | Status: AC
  Administered 2015-02-22: 40 meq via ORAL
  Filled 2015-02-22: qty 2

## 2015-02-22 NOTE — Progress Notes (Signed)
[facility] PROGRESS NOTE  Mathew GRUDZIEN ZOX:[id number] DOB: 09-Mar-1946 DOA: 02/19/2015 PCP: PROVIDER NOT IN SYSTEM  Assessment/Plan: Nausea/vomiting/diarrhea/fever -Has been afebrile 36 hours, C. difficile PCR negative, rest of stool pathogen panel pending. -Continue to treat symptomatically.  Acute renal failure -Product of prerenal azotemia with severe GI losses. -Continue IV fluid repletion. -Continue to hold diurretic and ACE inhibitor. -Continues to improve, down to 1.31 today.  Hypokalemia -Secondary to diarrhea. -Continue to replete orally.  -Magnesium within normal limits at 1.8.  Coronary artery disease -Stable, no chest pain.  Panhypopituitarism -Continue Synthroid, prednisone, Florinef. -No need for stress dose steroids as remains hemodynamically stable.  Tobacco abuse -Nicotine patch. -Counseled on cessation.   Code Status:  full code Family Communication:  patient only  Disposition Plan:  to be determined   Consultants:   none   Antibiotics:   none   Subjective:  still with diarrhea, has had 2 episodes this morning, very watery and is embarrassed to say that he has been incontinent.  Objective: Filed Vitals:   02/21/15 0601 02/21/15 1618 02/21/15 2047 02/22/15 0627  BP: 133/61 134/68 173/69 155/87  Pulse: 65 61 62 71  Temp: 98.9 F (37.2 C) 97.9 F (36.6 C) 98.2 F (36.8 C) 97.5 F (36.4 C)  TempSrc: Oral  Oral Oral  Resp: Height:      Weight:      SpO2: 95% 98% 97% 100%    Intake/Output Summary (Last 24 hours) at 02/22/15 1219 Last data filed at 02/22/15 0914  Gross per 24 hour  Intake    600 ml  Output   1850 ml  Net  -1250 ml   Filed Weights   02/19/15 1044 02/19/15 1654  Weight: 87.544 kg (193 lb) 88.905 kg (196 lb)    Exam:   General:   Alert, awake, oriented 3  Cardiovascular:  regular rate and rhythm, no murmurs, rubs or gallops  Respiratory:  clear to auscultation  bilaterally  Abdomen:  soft, nontender, nondistended, positive bowel sounds  Extremities:  trace bilateral pitting edema   Neurologic:   Intact and nonfocal  Data Reviewed: Basic Metabolic Panel:  Recent Labs Lab 02/19/15 1045 02/20/15 0615 02/21/15 0605 02/22/15 0610  NA 131* 134* 139 139  K 3.9 3.5 2.8* 3.1*  CL 93* 104 109 110  CO2 26 22 21* 21*  GLUCOSE 110* 74 75 77  BUN 33* 27* 16 9  CREATININE 2.73* 1.89* 1.49* 1.31*  CALCIUM 8.5* 7.5* 7.3* 7.6*  MG  --   --  1.8  --    Liver Function Tests:  Recent Labs Lab 02/19/15 1045  AST 49*  ALT 33  ALKPHOS 100  BILITOT 1.5*  PROT 6.8  ALBUMIN 3.6    Recent Labs Lab 02/19/15 1045  LIPASE 18*   No results for input(s): AMMONIA in the last 168 hours. CBC:  Recent Labs Lab 02/19/15 1045 02/20/15 0615 02/21/15 0605 02/22/15 0610  WBC 4.7 3.0* 2.2* 3.1*  NEUTROABS 3.5  --   --   --   HGB 16.5 13.1 13.0 13.0  HCT 48.6 40.5 39.5 39.9  MCV 86.2 86.7 86.4 86.2  PLT 105* 87* 80* 79*   Cardiac Enzymes:  Recent Labs Lab 02/19/15 1045  TROPONINI <0.03   BNP (last 3 results) No results for input(s): BNP in the last 8760 hours.  ProBNP (last 3 results) No results for input(s): PROBNP in the last 8760 hours.  CBG: No results  for input(s): GLUCAP in the last 168 hours.  Recent Results (from the past 240 hour(s))  Clostridium Difficile by PCR (not at [facility])     Status: None   Collection Time: 02/20/15  6:20 PM  Result Value Ref Range Status   C difficile by pcr NEGATIVE NEGATIVE Final     Studies: No results found.  Scheduled Meds: . ALPRAZolam  0.5 mg Oral BID  . aspirin EC  81 mg Oral Daily  . carvedilol  6.25 mg Oral BID WC  . citalopram  20 mg Oral Daily  . clopidogrel  75 mg Oral Daily  . finasteride  5 mg Oral Daily  . fludrocortisone  0.2 mg Oral BID  . heparin  5,000 Units Subcutaneous 3 times per day  . levothyroxine  100 mcg Oral QAC breakfast  . nicotine  21 mg Transdermal Daily   . pantoprazole  80 mg Oral Daily  . tamsulosin  0.4 mg Oral Daily   Continuous Infusions: . sodium chloride 125 mL/hr at 02/22/15 0909    Principal Problem:   Acute renal failure Active Problems:   Viral gastroenteritis   Essential hypertension, benign   CORONARY ATHEROSCLEROSIS NATIVE CORONARY ARTERY   Hypokalemia   Dehydration   ARF (acute renal failure)    Time spent:  25 minutes. Greater than 50% of this time was spent in direct contact with the patient coordinating care.    Chaya JanHERNANDEZ Rodriguez,Shantrell Placzek  [facility] Pager [phone]  If 7PM-7AM, please contact night-coverage at [email or web address], password [email or web address] 02/22/2015, 12:19 PM  LOS: 3 days

## 2015-02-22 NOTE — Progress Notes (Signed)
Spoke with Dr Ardyth HarpsHernandez about pt VTE, pt currently on heparin SQ.  Platelet count 02/22/2015 was 79.  Verbal order to discontinue heparin and use SCDs as pt VTE.  Will continue to monitor.

## 2015-02-23 DIAGNOSIS — N179 Acute kidney failure, unspecified: Secondary | ICD-10-CM | POA: Diagnosis not present

## 2015-02-23 LAB — BASIC METABOLIC PANEL
Anion gap: 9 (ref 5–15)
BUN: 6 mg/dL (ref 6–20)
CALCIUM: 7.3 mg/dL — AB (ref 8.9–10.3)
CO2: 23 mmol/L (ref 22–32)
Chloride: 105 mmol/L (ref 101–111)
Creatinine, Ser: 1.28 mg/dL — ABNORMAL HIGH (ref 0.61–1.24)
GFR calc Af Amer: >60 mL/min (ref >60)
GFR calc non Af Amer: 56 mL/min — ABNORMAL LOW (ref >60)
GLUCOSE: 87 mg/dL (ref 65–99)
Potassium: 2.7 mmol/L — CL (ref 3.5–5.1)
Sodium: 137 mmol/L (ref 135–145)

## 2015-02-23 LAB — CBC
HCT: 39.7 % (ref 39.0–52.0)
Hemoglobin: 13.2 g/dL (ref 13.0–17.0)
MCH: 28.3 pg (ref 26.0–34.0)
MCHC: 33.2 g/dL (ref 30.0–36.0)
MCV: 85 fL (ref 78.0–100.0)
Platelets: 89 10*3/uL — ABNORMAL LOW (ref 150–400)
RBC: 4.67 MIL/uL (ref 4.22–5.81)
RDW: 15.3 % (ref 11.5–15.5)
WBC: 4.6 10*3/uL (ref 4.0–10.5)

## 2015-02-23 MED ORDER — POTASSIUM CHLORIDE ER 20 MEQ PO TBCR: 20.0000 meq | EXTENDED_RELEASE_TABLET | Freq: Two times a day (BID) | ORAL | Status: AC

## 2015-02-23 MED ORDER — POTASSIUM CHLORIDE CRYS ER 20 MEQ PO TBCR
40.0000 meq | EXTENDED_RELEASE_TABLET | ORAL | Status: DC
  Administered 2015-02-23: 40 meq via ORAL
  Filled 2015-02-23: qty 2

## 2015-02-23 NOTE — Care Management Note (Signed)
Case Management Note  Patient Details  Name: Mathew Rodriguez MRN: [id number] Date of Birth: [birth year]  Expected Discharge Date:                  Expected Discharge Plan:  Home/Self Care  In-House Referral:  NA  Discharge planning Services  CM Consult, NA  Post Acute Care Choice:    Choice offered to:  NA  DME Arranged:    DME Agency:     HH Arranged:    HH Agency:     Status of Service:  Completed, signed off  Medicare Important Message Given:    Date Medicare IM Given:    Medicare IM give by:    Date Additional Medicare IM Given:    Additional Medicare Important Message give by:     If discussed at Long Length of Stay Meetings, dates discussed:    Additional Comments: Pt is from home, with wife and independent at baseline. Pt discharging home today with self care. No CM needs. Malcolm Metrohildress, Jaelyne Deeg Demske, RN 02/23/2015, 10:55 AM

## 2015-02-23 NOTE — Progress Notes (Signed)
Discharge instructions and prescriptions given, verbalized understanding, out in stable condition ambulatory with staff. 

## 2015-02-23 NOTE — Discharge Summary (Signed)
Physician Discharge Summary  Mathew Rodriguez WGN:[id number] DOB: 1945/09/28 DOA: 02/19/2015  PCP: PROVIDER NOT IN SYSTEM  Admit date: 02/19/2015 Discharge date: 02/23/2015  Time spent: 45 minutes  Recommendations for Outpatient Follow-up:  -Will be discharged home today. -Advised to follow-up with primary care provider in 2 days at which time repeat potassium level should be checked.   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 Patient states that about 3 days ago he started having nausea vomiting and diarrhea. He believes that over the past 2 days he has vomited about 10 times and this had about 5 episodes of diarrhea the symptoms are improving to the point where he has not had any further GI losses today. Today however he felt like he was having chills felt hot and cold, did not actually take his temperature, he also had severe weakness and difficulty getting out of bed which prompted his evaluation in the emergency department. In the ED as he is found to have acute renal failure, signs of clinical dehydration, hyponatremia, we have been asked to admit him for further evaluation and management.  Hospital Course:   Nausea/vomiting/diarrhea/fever -Afebrile 48 hours, C. difficile PCR negative, rest of stool pathogen panel is pending at time of discharge. -No further diarrhea overnight, patient  is anxious to be discharged home today.  Hypokalemia -Secondary to GI losses. -Potassium is 2.7 at time of discharge, will be given potassium 20 mEq to take twice daily for 3 days and then to follow-up with PCP to have potassium levels rechecked. -Magnesium was within normal limits at 1.8.  Acute renal failure -Product of prerenal azotemia with GI losses. -Improved with IV fluids. -We'll continue to hold hydrochlorothiazide and ACE inhibitor upon discharge.  Coronary artery disease -Stable, no chest pain.  Panhypopituitarism -Continue Synthroid, prednisone, Florinef at home doses.  Tobacco abuse -Counseled on cessation.  Thrombocytopenia -Platelet count has been around 75-90 throughout this hospitalization. Next line-suspect related to acute illness, trend has been improving, recheck at time of outpatient follow-up.  Procedures:  None   Consultations:  None  Discharge Instructions  Discharge Instructions    Increase activity slowly    Complete by:  As directed             Medication List    STOP taking these medications        hydrochlorothiazide 25 MG tablet  Commonly known as:  HYDRODIURIL     lisinopril 5 MG tablet  Commonly known as:  PRINIVIL,ZESTRIL      TAKE these medications        ALPRAZolam 0.5 MG tablet  Commonly known as:  XANAX  Take 0.5 mg by mouth 2 (two) times daily.     aspirin EC 81 MG tablet  Take 81 mg by mouth daily.     carvedilol 6.25 MG tablet  Commonly known as:  COREG  Take 6.25 mg by mouth 2 (  two) times daily.     citalopram 20 MG tablet  Commonly known as:  CELEXA  Take 20 mg by mouth daily.     clopidogrel 75 MG tablet  Commonly known as:  PLAVIX  Take 75 mg by mouth daily.     esomeprazole 40 MG capsule  Commonly known as:  NEXIUM  Take 40 mg by mouth daily before breakfast.     finasteride 5 MG tablet  Commonly known as:  PROSCAR  Take 5 mg by mouth daily.     fludrocortisone 0.1 MG tablet  Commonly known  as:  FLORINEF  Take 0.2 mg by mouth 2 (two) times daily.     levothyroxine 100 MCG tablet  Commonly known as:  SYNTHROID, LEVOTHROID  Take 100 mcg by mouth daily before breakfast.     Potassium Chloride ER 20 MEQ Tbcr  Take 20 mEq by mouth 2 (two) times daily.     predniSONE 5 MG tablet  Commonly known as:  DELTASONE  Take 7.5 mg by mouth daily.     tamsulosin 0.4 MG Caps capsule  Commonly known as:  FLOMAX  Take 0.4 mg by mouth daily.     testosterone cypionate 100 MG/ML injection  Commonly known as:  DEPOTESTOTERONE CYPIONATE  Inject 400 mg into the muscle every 14 (fourteen) days. For IM use only       No Known Allergies     Follow-up Information    Follow up with Loney Laurence On 03/09/2015.   Why:  to have your potassium levels rechecked at 12:30 pm   Contact information:   [phone]       The results of significant diagnostics from this hospitalization (including imaging, microbiology, ancillary and laboratory) are listed below for reference.    Significant Diagnostic Studies: Dg Abd Acute W/chest  02/19/2015   CLINICAL DATA:  Vomiting with shortness breath, fever and weakness for 2 days. Initial encounter.  EXAM: DG ABDOMEN ACUTE W/ 1V CHEST  COMPARISON:  Chest radiographs 03/22/2009.  FINDINGS: The heart size and mediastinal contours are normal. The lungs are clear. There is no pleural effusion or pneumothorax. No acute osseous findings are identified.  The bowel gas pattern is nonobstructive. There is no evidence of free intraperitoneal air. Pelvic calcifications are likely phleboliths. The bones appear unremarkable. Telemetry leads overlie the chest and abdomen.  IMPRESSION: No evidence of active cardiopulmonary or abdominal process.   Electronically Signed   By: Carey Bullocks M.D.   On: 02/19/2015 11:25    Microbiology: Recent Results (from the past 240 hour(s))  Clostridium Difficile by PCR (not at [facility])     Status: None   Collection Time: 02/20/15  6:20  PM  Result Value Ref Range Status   C difficile by pcr NEGATIVE NEGATIVE Final     Labs: Basic Metabolic Panel:  Recent Labs Lab 02/19/15 1045 02/20/15 0615 02/21/15 0605 02/22/15 0610 02/23/15 0536  NA 131* 134* 139 139 137  K 3.9 3.5 2.8* 3.1* 2.7*  CL 93* 104 109 110 105  CO2 26 22 21* 21* 23  GLUCOSE 110* 74 75 77 87  BUN 33* 27* 16 9 6   CREATININE 2.73* 1.89* 1.49* 1.31* 1.28*  CALCIUM 8.5* 7.5* 7.3* 7.6* 7.3*  MG  --   --  1.8  --   --    Liver Function Tests:  Recent Labs Lab 02/19/15 1045  AST 49*  ALT 33  ALKPHOS 100  BILITOT 1.5*  PROT 6.8  ALBUMIN 3.6  Recent Labs Lab 02/19/15 1045  LIPASE 18*   No results for input(s): AMMONIA in the last 168 hours. CBC:  Recent Labs Lab 02/19/15 1045 02/20/15 0615 02/21/15 0605 02/22/15 0610 02/23/15 0536  WBC 4.7 3.0* 2.2* 3.1* 4.6  NEUTROABS 3.5  --   --   --   --   HGB 16.5 13.1 13.0 13.0 13.2  HCT 48.6 40.5 39.5 39.9 39.7  MCV 86.2 86.7 86.4 86.2 85.0  PLT 105* 87* 80* 79* 89*   Cardiac Enzymes:  Recent Labs Lab 02/19/15 1045  TROPONINI <0.03   BNP: BNP (last 3 results) No results for input(s): BNP in the last 8760 hours.  ProBNP (last 3 results) No results for input(s): PROBNP in the last 8760 hours.  CBG: No results for input(s): GLUCAP in the last 168 hours.     SignedChaya Jan:  HERNANDEZ ACOSTA,ESTELA  [facility] Pager: [phone] 02/23/2015, 3:05 PM

## 2015-02-25 LAB — GI PATHOGEN PANEL BY PCR, STOOL
C difficile toxin A/B: NOT DETECTED
CAMPYLOBACTER BY PCR: NOT DETECTED
CRYPTOSPORIDIUM BY PCR: NOT DETECTED
E COLI (ETEC) LT/ST: NOT DETECTED
E COLI (STEC): NOT DETECTED
E coli 0157 by PCR: NOT DETECTED
G lamblia by PCR: NOT DETECTED
Norovirus GI/GII: NOT DETECTED
Rotavirus A by PCR: NOT DETECTED
Salmonella by PCR: NOT DETECTED
Shigella by PCR: NOT DETECTED

## 2016-02-23 ENCOUNTER — Other Ambulatory Visit: Payer: Self-pay | Admitting: Nephrology

## 2016-02-23 DIAGNOSIS — N183 Chronic kidney disease, stage 3 unspecified: Secondary | ICD-10-CM

## 2016-02-28 ENCOUNTER — Ambulatory Visit (HOSPITAL_COMMUNITY): Admission: RE | Admit: 2016-02-28 | Payer: Medicare Other | Source: Ambulatory Visit

## 2016-03-01 ENCOUNTER — Ambulatory Visit (HOSPITAL_COMMUNITY)
Admission: RE | Admit: 2016-03-01 | Discharge: 2016-03-01 | Disposition: A | Discharge status: Home / Self Care | Payer: Medicare Other | Source: Ambulatory Visit | Attending: Nephrology | Admitting: Nephrology

## 2016-03-01 DIAGNOSIS — N400 Enlarged prostate without lower urinary tract symptoms: Secondary | ICD-10-CM | POA: Insufficient documentation

## 2016-03-01 DIAGNOSIS — N183 Chronic kidney disease, stage 3 unspecified: Secondary | ICD-10-CM

## 2016-06-20 IMAGING — DX DG ABDOMEN ACUTE W/ 1V CHEST
3 series · 3 of 3 positions shown · non-contrast
Comparison: Chest radiographs 03/22/2009.

CLINICAL DATA: Vomiting with shortness breath, fever and weakness
for 2 days. Initial encounter.

EXAM:
DG ABDOMEN ACUTE W/ 1V CHEST

[abdomen erect]
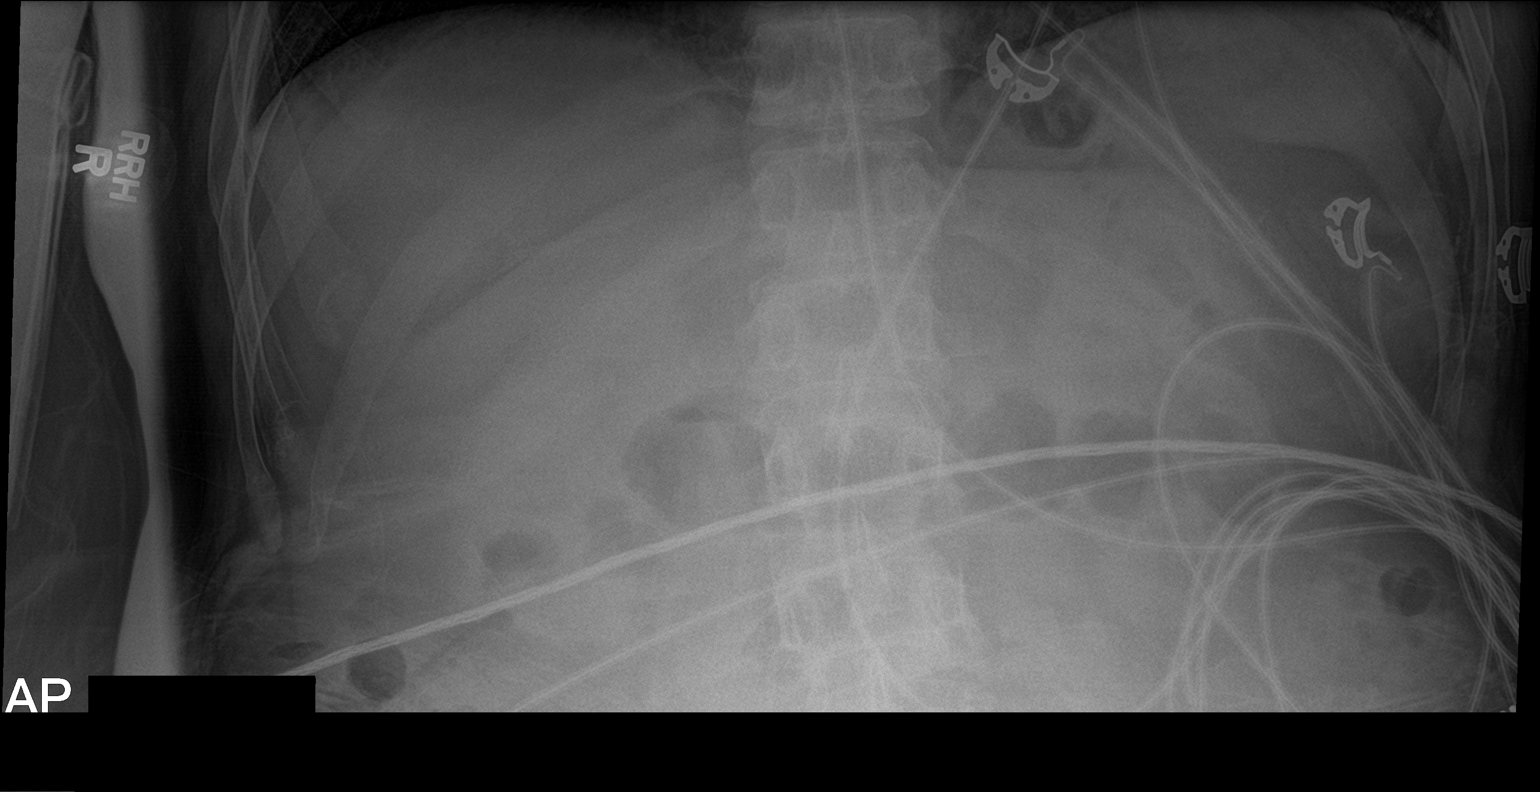

[abdomen supine]
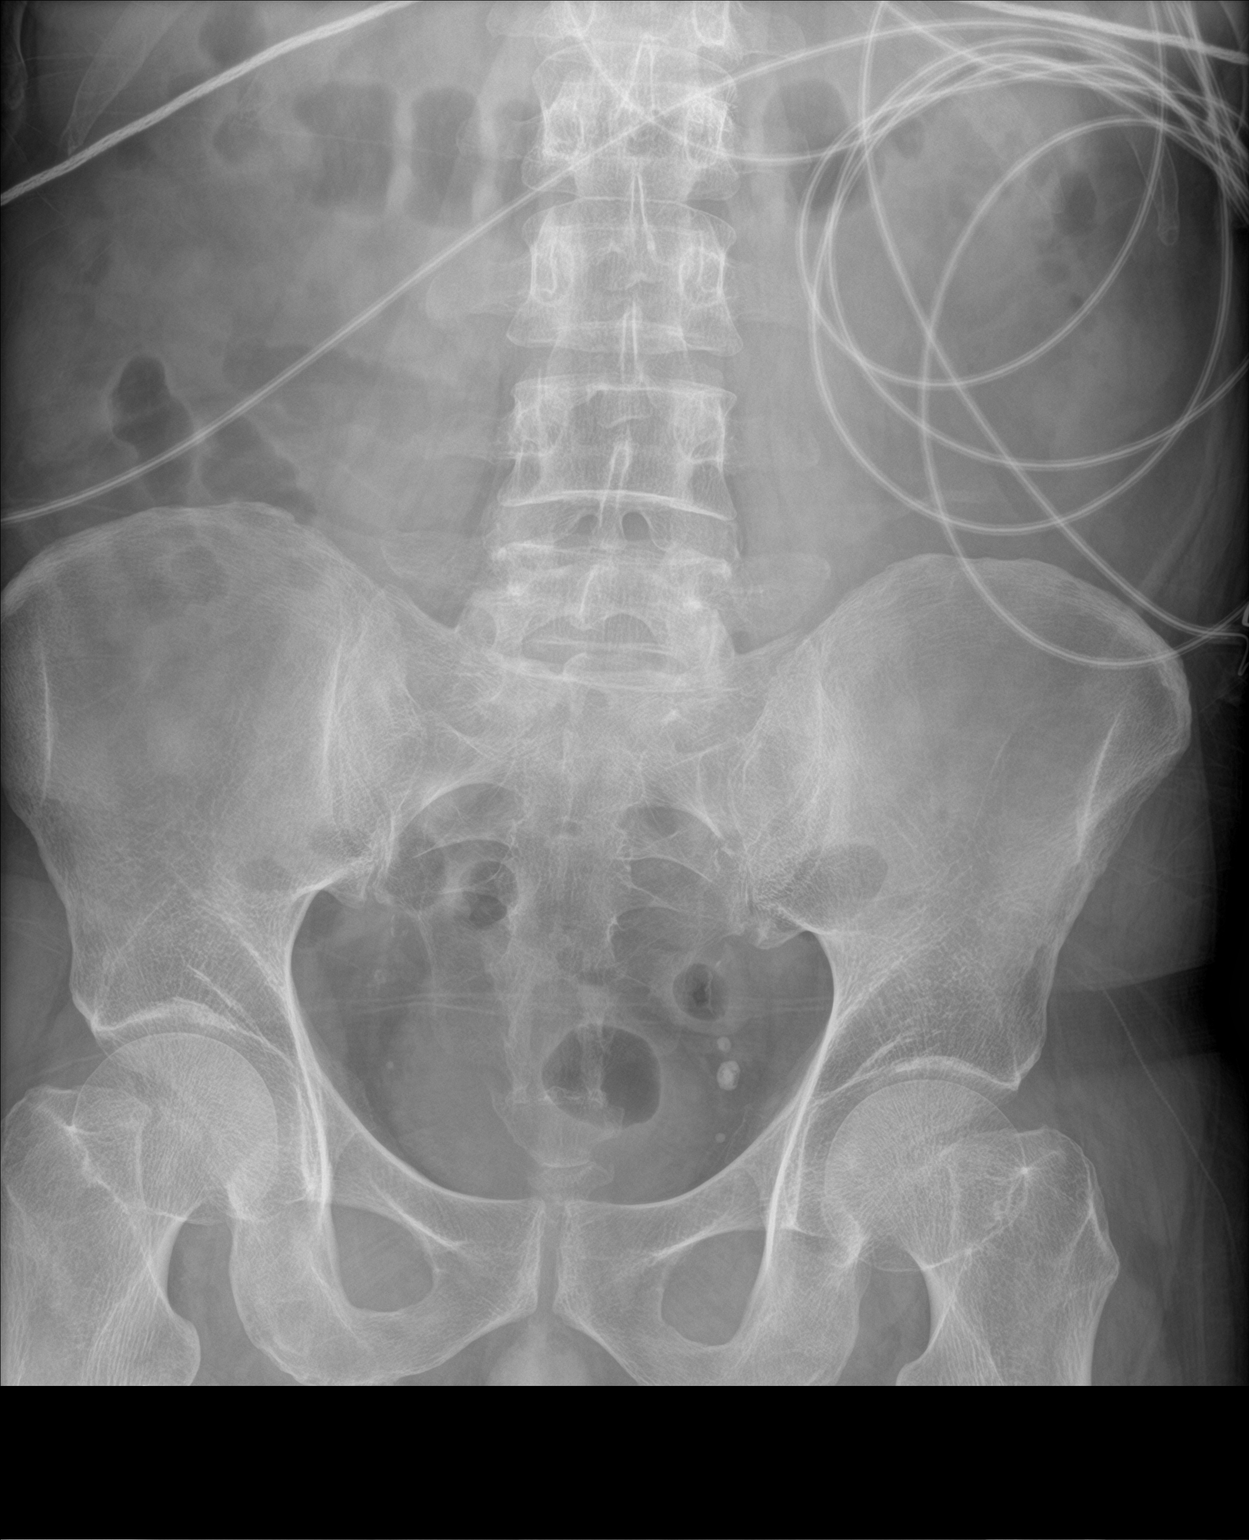

[chest ap]
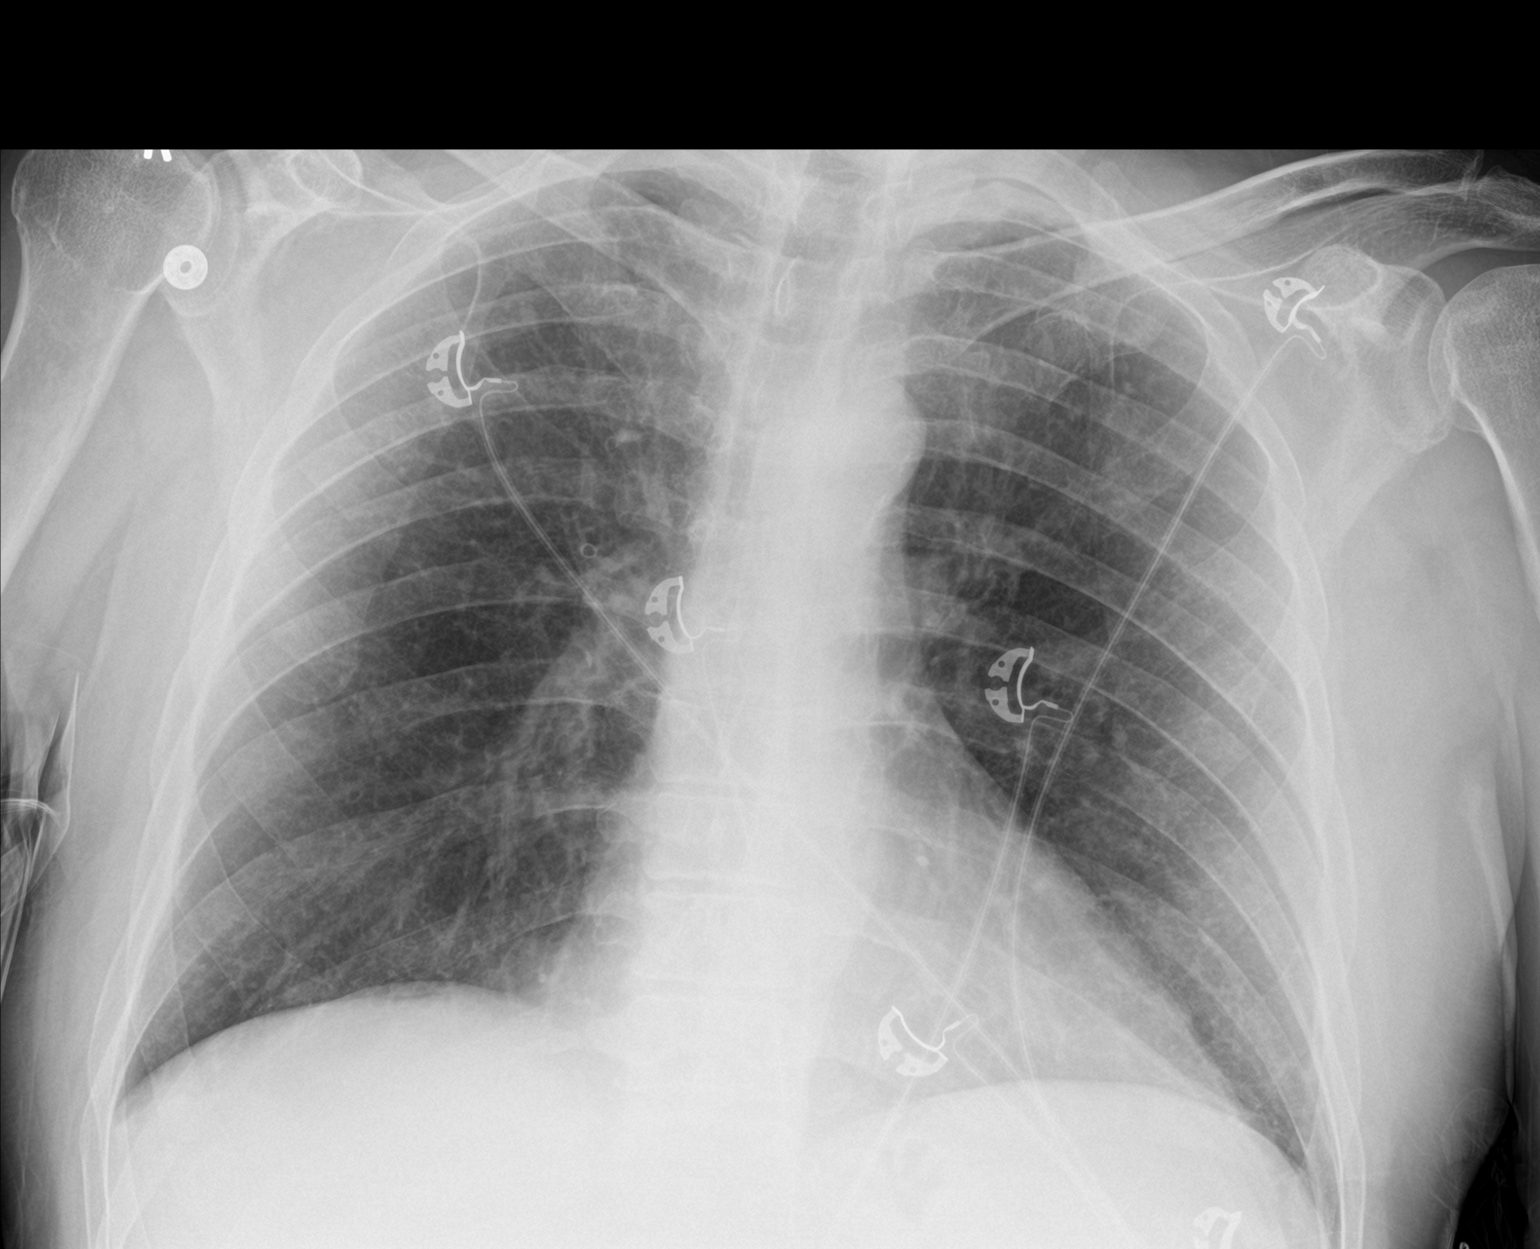

[3 of 3 positions shown; findings below may reference images not displayed]

FINDINGS: The heart size and mediastinal contours are normal. The lungs are
clear. There is no pleural effusion or pneumothorax. No acute
osseous findings are identified.

The bowel gas pattern is nonobstructive. There is no evidence of
free intraperitoneal air. Pelvic calcifications are likely
phleboliths. The bones appear unremarkable. Telemetry leads overlie
the chest and abdomen.
IMPRESSION: No evidence of active cardiopulmonary or abdominal process.

## 2017-02-22 ENCOUNTER — Ambulatory Visit
Admission: RE | Admit: 2017-02-22 | Discharge: 2017-02-22 | Payer: MEDICARE | Attending: Cardiovascular Disease | Admitting: Cardiovascular Disease

## 2017-02-22 DIAGNOSIS — E230 Hypopituitarism: Secondary | ICD-10-CM

## 2017-02-22 DIAGNOSIS — F17200 Nicotine dependence, unspecified, uncomplicated: Secondary | ICD-10-CM

## 2017-02-22 DIAGNOSIS — I2510 Atherosclerotic heart disease of native coronary artery without angina pectoris: Principal | ICD-10-CM

## 2017-02-22 DIAGNOSIS — I10 Essential (primary) hypertension: Secondary | ICD-10-CM

## 2017-02-22 DIAGNOSIS — R079 Chest pain, unspecified: Secondary | ICD-10-CM

## 2017-02-22 MED ORDER — DEXTROMETHORPHAN-GUAIFENESIN ER 60 MG-1,200 MG TAB,EXTEND RELEASE,12HR
0 refills | 0 days | Status: CP
Start: 2017-02-22 — End: 2017-08-04

## 2017-03-04 ENCOUNTER — Observation Stay
Admission: EM | Admit: 2017-03-04 | Discharge: 2017-03-06 | Disposition: A | Discharge status: Home / Self Care | Payer: MEDICARE | Source: Intra-hospital

## 2017-03-04 DIAGNOSIS — R079 Chest pain, unspecified: Principal | ICD-10-CM

## 2017-03-06 MED ORDER — AMLODIPINE 5 MG TABLET
ORAL_TABLET | Freq: Every day | ORAL | 3 refills | 0.00000 days | Status: SS
Start: 2017-03-06 — End: 2017-08-04

## 2017-03-06 MED ORDER — CARVEDILOL 25 MG TABLET
ORAL_TABLET | Freq: Two times a day (BID) | ORAL | 0 refills | 0.00000 days | Status: CP
Start: 2017-03-06 — End: 2017-04-05

## 2017-03-06 MED ORDER — LISINOPRIL 20 MG TABLET
ORAL_TABLET | Freq: Every day | ORAL | 0 refills | 0 days | Status: CP
Start: 2017-03-06 — End: 2017-04-04

## 2017-04-04 MED ORDER — LISINOPRIL 20 MG TABLET
ORAL_TABLET | Freq: Every day | ORAL | 12 refills | 0 days | Status: CP
Start: 2017-04-04 — End: 2017-08-04

## 2017-04-05 MED ORDER — CARVEDILOL 25 MG TABLET
ORAL_TABLET | Freq: Two times a day (BID) | ORAL | 3 refills | 0 days | Status: SS
Start: 2017-04-05 — End: 2017-08-04

## 2017-04-09 ENCOUNTER — Ambulatory Visit
Admission: RE | Admit: 2017-04-09 | Discharge: 2017-04-09 | Disposition: A | Discharge status: Home / Self Care | Payer: MEDICARE | Attending: "Endocrinology | Admitting: "Endocrinology

## 2017-04-09 DIAGNOSIS — E230 Hypopituitarism: Principal | ICD-10-CM

## 2017-04-09 MED ORDER — PREDNISONE 10 MG TABLET
ORAL_TABLET | Freq: Every day | ORAL | 3 refills | 0 days | Status: CP
Start: 2017-04-09 — End: 2018-01-03

## 2017-05-03 ENCOUNTER — Emergency Department
Admission: EM | Admit: 2017-05-03 | Discharge: 2017-05-03 | Disposition: A | Discharge status: Home / Self Care | Payer: MEDICARE | Source: Intra-hospital

## 2017-05-03 DIAGNOSIS — R079 Chest pain, unspecified: Principal | ICD-10-CM

## 2017-05-03 MED ORDER — DOXYCYCLINE HYCLATE 100 MG CAPSULE
ORAL_CAPSULE | Freq: Two times a day (BID) | ORAL | 0 refills | 0.00000 days | Status: CP
Start: 2017-05-03 — End: 2017-05-10

## 2017-05-11 ENCOUNTER — Ambulatory Visit
Admission: RE | Admit: 2017-05-11 | Discharge: 2017-05-11 | Disposition: A | Discharge status: Home / Self Care | Payer: MEDICARE

## 2017-05-11 ENCOUNTER — Ambulatory Visit: Admission: RE | Admit: 2017-05-11 | Discharge: 2017-05-11 | Disposition: A | Discharge status: Home / Self Care

## 2017-05-11 ENCOUNTER — Ambulatory Visit
Admission: RE | Admit: 2017-05-11 | Discharge: 2017-05-11 | Disposition: A | Discharge status: Home / Self Care | Payer: MEDICARE | Attending: Nephrology | Admitting: Nephrology

## 2017-05-11 DIAGNOSIS — N183 Chronic kidney disease, stage 3 (moderate): Principal | ICD-10-CM

## 2017-05-11 DIAGNOSIS — I10 Essential (primary) hypertension: Secondary | ICD-10-CM

## 2017-08-04 MED ORDER — ALPRAZOLAM 1 MG TABLET: tablet | 0 refills | 0 days | Status: SS

## 2017-08-04 MED ORDER — CARVEDILOL 6.25 MG TABLET
ORAL_TABLET | Freq: Two times a day (BID) | ORAL | 0 refills | 0 days | Status: CP
Start: 2017-08-04 — End: 2017-08-06

## 2017-08-04 MED ORDER — FLUDROCORTISONE 0.1 MG TABLET
ORAL_TABLET | Freq: Every day | ORAL | 11 refills | 0.00000 days
Start: 2017-08-04 — End: 2018-04-04

## 2017-08-04 MED ORDER — ALPRAZOLAM 1 MG TABLET: 1 mg | tablet | Freq: Every day | 0 refills | 0 days | Status: AC

## 2017-08-04 MED ORDER — ALPRAZOLAM 0.5 MG TABLET
ORAL_TABLET | 0 refills | 0 days | Status: CP
Start: 2017-08-04 — End: 2017-08-04

## 2017-08-04 MED ORDER — AMLODIPINE 5 MG TABLET
ORAL_TABLET | Freq: Every day | ORAL | 3 refills | 0.00000 days
Start: 2017-08-04 — End: 2017-08-06

## 2017-08-04 MED ORDER — ALPRAZOLAM 1 MG TABLET
ORAL_TABLET | Freq: Every day | ORAL | 0 refills | 0.00000 days | Status: CP
Start: 2017-08-04 — End: 2017-08-04

## 2017-08-05 ENCOUNTER — Inpatient Hospital Stay
Admission: EM | Admit: 2017-08-05 | Discharge: 2017-08-07 | Disposition: A | Discharge status: Home / Self Care | Payer: MEDICARE | Source: Intra-hospital

## 2017-08-05 ENCOUNTER — Inpatient Hospital Stay
Admission: EM | Admit: 2017-08-05 | Discharge: 2017-08-07 | Disposition: A | Discharge status: Home / Self Care | Payer: MEDICARE | Source: Intra-hospital | Attending: Internal Medicine | Admitting: Internal Medicine

## 2017-08-05 DIAGNOSIS — I25110 Atherosclerotic heart disease of native coronary artery with unstable angina pectoris: Principal | ICD-10-CM

## 2017-08-05 MED ORDER — LISINOPRIL 20 MG TABLET
ORAL_TABLET | Freq: Every day | ORAL | 0 refills | 0 days | Status: CP
Start: 2017-08-05 — End: 2018-04-04

## 2017-08-07 MED ORDER — EZETIMIBE 10 MG TABLET
ORAL_TABLET | Freq: Every evening | ORAL | 0 refills | 0 days | Status: CP
Start: 2017-08-07 — End: 2017-09-06

## 2017-08-07 MED ORDER — CARVEDILOL 6.25 MG TABLET
ORAL_TABLET | Freq: Two times a day (BID) | ORAL | 0 refills | 0 days | Status: CP
Start: 2017-08-07 — End: 2017-09-27

## 2017-08-07 MED ORDER — NICOTINE (POLACRILEX) 4 MG GUM
BUCCAL | 0 refills | 0 days | Status: CP | PRN
Start: 2017-08-07 — End: 2017-09-06

## 2017-08-08 MED ORDER — NICOTINE 21 MG/24 HR DAILY TRANSDERMAL PATCH
MEDICATED_PATCH | TRANSDERMAL | 0 refills | 0 days | Status: CP
Start: 2017-08-08 — End: 2017-09-07

## 2017-08-08 MED ORDER — AMLODIPINE 5 MG TABLET
ORAL_TABLET | Freq: Every day | ORAL | 3 refills | 0.00000 days | Status: CP
Start: 2017-08-08 — End: 2017-09-27

## 2017-09-27 ENCOUNTER — Encounter
Admit: 2017-09-27 | Discharge: 2017-09-28 | Payer: MEDICARE | Attending: Cardiovascular Disease | Primary: Cardiovascular Disease

## 2017-09-27 DIAGNOSIS — I200 Unstable angina: Secondary | ICD-10-CM

## 2017-09-27 DIAGNOSIS — I2510 Atherosclerotic heart disease of native coronary artery without angina pectoris: Principal | ICD-10-CM

## 2017-09-27 DIAGNOSIS — I10 Essential (primary) hypertension: Secondary | ICD-10-CM

## 2017-09-27 DIAGNOSIS — N183 Chronic kidney disease, stage 3 (moderate): Secondary | ICD-10-CM

## 2017-09-27 DIAGNOSIS — F17200 Nicotine dependence, unspecified, uncomplicated: Secondary | ICD-10-CM

## 2017-09-27 DIAGNOSIS — E230 Hypopituitarism: Secondary | ICD-10-CM

## 2017-09-27 MED ORDER — AMLODIPINE 5 MG TABLET
ORAL_TABLET | Freq: Every day | ORAL | 3 refills | 0 days | Status: CP
Start: 2017-09-27 — End: 2018-04-04

## 2017-09-27 MED ORDER — TESTOSTERONE CYPIONATE 200 MG/ML INTRAMUSCULAR OIL
INTRAMUSCULAR | 0 refills | 0 days | Status: CP
Start: 2017-09-27 — End: 2018-01-03

## 2017-09-27 MED ORDER — CARVEDILOL 6.25 MG TABLET
ORAL_TABLET | Freq: Two times a day (BID) | ORAL | 3 refills | 0 days | Status: CP
Start: 2017-09-27 — End: 2018-04-04

## 2018-01-03 ENCOUNTER — Encounter
Admit: 2018-01-03 | Discharge: 2018-01-04 | Payer: MEDICARE | Attending: Cardiovascular Disease | Primary: Cardiovascular Disease

## 2018-01-03 DIAGNOSIS — I10 Essential (primary) hypertension: Secondary | ICD-10-CM

## 2018-01-03 DIAGNOSIS — N3941 Urge incontinence: Secondary | ICD-10-CM

## 2018-01-03 DIAGNOSIS — F17200 Nicotine dependence, unspecified, uncomplicated: Secondary | ICD-10-CM

## 2018-01-03 DIAGNOSIS — N183 Chronic kidney disease, stage 3 (moderate): Secondary | ICD-10-CM

## 2018-01-03 DIAGNOSIS — I2510 Atherosclerotic heart disease of native coronary artery without angina pectoris: Principal | ICD-10-CM

## 2018-01-03 DIAGNOSIS — E230 Hypopituitarism: Secondary | ICD-10-CM

## 2018-01-03 DIAGNOSIS — I200 Unstable angina: Secondary | ICD-10-CM

## 2018-01-03 DIAGNOSIS — N400 Enlarged prostate without lower urinary tract symptoms: Secondary | ICD-10-CM

## 2018-01-03 MED ORDER — TESTOSTERONE CYPIONATE 200 MG/ML INTRAMUSCULAR OIL
INTRAMUSCULAR | 0 refills | 0 days | Status: CP
Start: 2018-01-03 — End: ?

## 2018-01-03 MED ORDER — PREDNISONE 10 MG TABLET
ORAL_TABLET | Freq: Every day | ORAL | 3 refills | 0 days | Status: CP
Start: 2018-01-03 — End: 2018-04-04

## 2018-01-03 MED ORDER — CEPHALEXIN 500 MG CAPSULE
ORAL_CAPSULE | Freq: Two times a day (BID) | ORAL | 0 refills | 0.00000 days | Status: CP
Start: 2018-01-03 — End: 2018-01-10

## 2018-01-03 MED ORDER — LEVOTHYROXINE 100 MCG TABLET
ORAL_TABLET | Freq: Every day | ORAL | 3 refills | 0 days | Status: CP
Start: 2018-01-03 — End: 2018-04-04

## 2018-01-03 MED ORDER — FINASTERIDE 5 MG TABLET
ORAL_TABLET | Freq: Every day | ORAL | 12 refills | 0.00000 days | Status: CP
Start: 2018-01-03 — End: ?

## 2018-04-04 ENCOUNTER — Encounter
Admit: 2018-04-04 | Discharge: 2018-04-05 | Payer: MEDICARE | Attending: Cardiovascular Disease | Primary: Cardiovascular Disease

## 2018-04-04 DIAGNOSIS — F17200 Nicotine dependence, unspecified, uncomplicated: Secondary | ICD-10-CM

## 2018-04-04 DIAGNOSIS — N183 Chronic kidney disease, stage 3 (moderate): Secondary | ICD-10-CM

## 2018-04-04 DIAGNOSIS — I10 Essential (primary) hypertension: Secondary | ICD-10-CM

## 2018-04-04 DIAGNOSIS — I2510 Atherosclerotic heart disease of native coronary artery without angina pectoris: Principal | ICD-10-CM

## 2018-04-04 DIAGNOSIS — E230 Hypopituitarism: Secondary | ICD-10-CM

## 2018-04-04 MED ORDER — CARVEDILOL 6.25 MG TABLET
ORAL_TABLET | Freq: Two times a day (BID) | ORAL | 3 refills | 0.00000 days | Status: CP
Start: 2018-04-04 — End: ?

## 2018-04-04 MED ORDER — FLUDROCORTISONE 0.1 MG TABLET
ORAL_TABLET | Freq: Every day | ORAL | 11 refills | 0.00000 days
Start: 2018-04-04 — End: 2019-04-04

## 2018-04-04 MED ORDER — PREDNISONE 10 MG TABLET
ORAL_TABLET | Freq: Every day | ORAL | 3 refills | 0 days | Status: CP
Start: 2018-04-04 — End: ?

## 2018-04-04 MED ORDER — AMLODIPINE 5 MG TABLET
ORAL_TABLET | Freq: Every day | ORAL | 3 refills | 0.00000 days | Status: CP
Start: 2018-04-04 — End: 2019-04-04

## 2018-04-04 MED ORDER — LISINOPRIL 20 MG TABLET
ORAL_TABLET | Freq: Every day | ORAL | 0 refills | 0.00000 days | Status: CP
Start: 2018-04-04 — End: 2018-12-02

## 2018-04-04 MED ORDER — LEVOTHYROXINE 100 MCG TABLET
ORAL_TABLET | Freq: Every day | ORAL | 3 refills | 0 days | Status: CP
Start: 2018-04-04 — End: ?

## 2018-04-08 ENCOUNTER — Ambulatory Visit
Admit: 2018-04-08 | Discharge: 2018-04-10 | Disposition: A | Discharge status: Home / Self Care | Payer: MEDICARE | Admitting: Cardiovascular Disease

## 2018-04-08 DIAGNOSIS — R072 Precordial pain: Principal | ICD-10-CM

## 2018-04-10 MED ORDER — EZETIMIBE 10 MG TABLET
ORAL_TABLET | Freq: Every day | ORAL | 0 refills | 0.00000 days | Status: CP
Start: 2018-04-10 — End: 2018-05-10

## 2018-04-10 MED ORDER — ROSUVASTATIN 5 MG TABLET
ORAL_TABLET | Freq: Every evening | ORAL | 0 refills | 0.00000 days | Status: CP
Start: 2018-04-10 — End: 2018-09-05

## 2018-04-10 MED ORDER — NICOTINE 21 MG/24 HR DAILY TRANSDERMAL PATCH
MEDICATED_PATCH | Freq: Every day | TRANSDERMAL | 0 refills | 0.00000 days | Status: CP
Start: 2018-04-10 — End: 2018-06-13

## 2018-04-10 MED ORDER — NICOTINE (POLACRILEX) 4 MG BUCCAL LOZENGE
BUCCAL | 3 refills | 0 days | Status: CP | PRN
Start: 2018-04-10 — End: ?

## 2018-06-13 ENCOUNTER — Encounter
Admit: 2018-06-13 | Discharge: 2018-06-14 | Payer: MEDICARE | Attending: Cardiovascular Disease | Primary: Cardiovascular Disease

## 2018-06-13 DIAGNOSIS — E230 Hypopituitarism: Secondary | ICD-10-CM

## 2018-06-13 DIAGNOSIS — F17200 Nicotine dependence, unspecified, uncomplicated: Secondary | ICD-10-CM

## 2018-06-13 DIAGNOSIS — I2510 Atherosclerotic heart disease of native coronary artery without angina pectoris: Principal | ICD-10-CM

## 2018-06-13 DIAGNOSIS — I10 Essential (primary) hypertension: Secondary | ICD-10-CM

## 2018-07-03 ENCOUNTER — Encounter: Admit: 2018-07-03 | Discharge: 2018-07-03 | Payer: MEDICARE

## 2018-09-05 ENCOUNTER — Encounter
Admit: 2018-09-05 | Discharge: 2018-09-06 | Payer: MEDICARE | Attending: Cardiovascular Disease | Primary: Cardiovascular Disease

## 2018-09-05 DIAGNOSIS — I2510 Atherosclerotic heart disease of native coronary artery without angina pectoris: Principal | ICD-10-CM

## 2018-09-05 DIAGNOSIS — I10 Essential (primary) hypertension: Secondary | ICD-10-CM

## 2018-09-05 DIAGNOSIS — E230 Hypopituitarism: Secondary | ICD-10-CM

## 2018-09-05 DIAGNOSIS — F17200 Nicotine dependence, unspecified, uncomplicated: Secondary | ICD-10-CM

## 2018-09-05 DIAGNOSIS — N183 Chronic kidney disease, stage 3 (moderate): Secondary | ICD-10-CM

## 2018-09-30 ENCOUNTER — Ambulatory Visit: Admit: 2018-09-30 | Discharge: 2018-10-01 | Payer: MEDICARE | Attending: "Endocrinology | Primary: "Endocrinology

## 2018-09-30 DIAGNOSIS — E230 Hypopituitarism: Principal | ICD-10-CM

## 2018-09-30 DIAGNOSIS — J449 Chronic obstructive pulmonary disease, unspecified: Secondary | ICD-10-CM

## 2018-09-30 DIAGNOSIS — T50905S Adverse effect of unspecified drugs, medicaments and biological substances, sequela: Secondary | ICD-10-CM

## 2018-12-25 ENCOUNTER — Emergency Department (HOSPITAL_COMMUNITY)
Admission: EM | Admit: 2018-12-25 | Discharge: 2018-12-25 | Disposition: A | Discharge status: Home / Self Care | Payer: Medicare Other | Attending: Emergency Medicine | Admitting: Emergency Medicine

## 2018-12-25 ENCOUNTER — Other Ambulatory Visit: Payer: Self-pay

## 2018-12-25 ENCOUNTER — Encounter (HOSPITAL_COMMUNITY): Payer: Self-pay

## 2018-12-25 DIAGNOSIS — F17210 Nicotine dependence, cigarettes, uncomplicated: Secondary | ICD-10-CM | POA: Diagnosis not present

## 2018-12-25 DIAGNOSIS — I2510 Atherosclerotic heart disease of native coronary artery without angina pectoris: Secondary | ICD-10-CM | POA: Insufficient documentation

## 2018-12-25 DIAGNOSIS — Z79899 Other long term (current) drug therapy: Secondary | ICD-10-CM | POA: Diagnosis not present

## 2018-12-25 DIAGNOSIS — R300 Dysuria: Secondary | ICD-10-CM | POA: Diagnosis present

## 2018-12-25 DIAGNOSIS — I252 Old myocardial infarction: Secondary | ICD-10-CM | POA: Insufficient documentation

## 2018-12-25 DIAGNOSIS — Z7982 Long term (current) use of aspirin: Secondary | ICD-10-CM | POA: Insufficient documentation

## 2018-12-25 DIAGNOSIS — I129 Hypertensive chronic kidney disease with stage 1 through stage 4 chronic kidney disease, or unspecified chronic kidney disease: Secondary | ICD-10-CM | POA: Insufficient documentation

## 2018-12-25 DIAGNOSIS — N183 Chronic kidney disease, stage 3 unspecified: Secondary | ICD-10-CM

## 2018-12-25 DIAGNOSIS — F419 Anxiety disorder, unspecified: Secondary | ICD-10-CM | POA: Insufficient documentation

## 2018-12-25 DIAGNOSIS — Z7902 Long term (current) use of antithrombotics/antiplatelets: Secondary | ICD-10-CM | POA: Diagnosis not present

## 2018-12-25 LAB — BASIC METABOLIC PANEL
Anion gap: 10 (ref 5–15)
BUN: 17 mg/dL (ref 8–23)
CO2: 22 mmol/L (ref 22–32)
Calcium: 8.8 mg/dL — ABNORMAL LOW (ref 8.9–10.3)
Chloride: 104 mmol/L (ref 98–111)
Creatinine, Ser: 1.66 mg/dL — ABNORMAL HIGH (ref 0.61–1.24)
GFR calc Af Amer: 47 mL/min — ABNORMAL LOW (ref >60)
GFR calc non Af Amer: 41 mL/min — ABNORMAL LOW (ref >60)
Glucose, Bld: 115 mg/dL — ABNORMAL HIGH (ref 70–99)
Potassium: 4.6 mmol/L (ref 3.5–5.1)
Sodium: 136 mmol/L (ref 135–145)

## 2018-12-25 LAB — CBC
HCT: 44.1 % (ref 39.0–52.0)
Hemoglobin: 13.4 g/dL (ref 13.0–17.0)
MCH: 23.1 pg — ABNORMAL LOW (ref 26.0–34.0)
MCHC: 30.4 g/dL (ref 30.0–36.0)
MCV: 75.9 fL — ABNORMAL LOW (ref 80.0–100.0)
Platelets: 200 10*3/uL (ref 150–400)
RBC: 5.81 MIL/uL (ref 4.22–5.81)
RDW: 19.4 % — ABNORMAL HIGH (ref 11.5–15.5)
WBC: 13.2 10*3/uL — ABNORMAL HIGH (ref 4.0–10.5)
nRBC: 0 % (ref 0.0–0.2)

## 2018-12-25 LAB — URINALYSIS, ROUTINE W REFLEX MICROSCOPIC
Bacteria, UA: NONE SEEN
Bilirubin Urine: NEGATIVE
Glucose, UA: NEGATIVE mg/dL
Ketones, ur: NEGATIVE mg/dL
Leukocytes,Ua: NEGATIVE
Nitrite: NEGATIVE
Protein, ur: NEGATIVE mg/dL
Specific Gravity, Urine: 1.012 (ref 1.005–1.030)
pH: 6 (ref 5.0–8.0)

## 2018-12-25 NOTE — Discharge Instructions (Addendum)
It was our pleasure to provide your ER care today - we are glad that your earlier symptoms have resolved.  From today's labs, compared to a few years ago, your kidney function test is mildly increased (creatinine 1.6) - drink plenty of fluids, and avoid taking any anti-inflammatory type pain medication such as ibuprofen or motrin, naprosyn or aleve.    Follow up with primary care doctor for recheck in the coming week.   Return to ER right away if worse, new symptoms, high fevers, new or severe pain, trouble breathing, feeling weak/faint, other concern.

## 2018-12-25 NOTE — ED Provider Notes (Signed)
[facility] EMERGENCY DEPARTMENT Provider Note   CSN: [id number] Arrival date & time: 12/25/18  1709    History   Chief Complaint Chief Complaint  Patient presents with  . Anxiety    HPI Mathew Rodriguez is a 73 y.o. male.     Patient indicates last night was having some trouble urinating, and at one point had a chills. States today he felt anxious, and bit jumpy, and felt as if was having anxiety. Denies fever. No sweats. States remote hx anxiety, takes xanax prn. Currently states feels fine. Denies any current or recent chest pain or discomfort. No headaches. No sob or unusual doe. Denies cough or uri symptoms. No abd pain. No vomiting or diarrhea. No dysuria or odor to urine. Today, feels as if is able to empty bladder fully. Denies any recent change in meds. Denies any new/stressful situations. Denies depression.   The history is provided by the patient.  Anxiety  Pertinent negatives include no chest pain, no abdominal pain, no headaches and no shortness of breath.    Past Medical History:  Diagnosis Date  . AAA (abdominal aortic aneurysm) (HCC)   . Coronary artery disease    16 stents placed  . Hypertension   . Hypopituitarism (HCC)   . Myocardial infarction (HCC)   . Renal artery stenosis (HCC)    s/p stent  . Thyroid disease     Patient Active Problem List   Diagnosis Date Noted  . Acute renal failure (HCC) 02/19/2015  . Viral gastroenteritis 02/19/2015  . Dehydration 02/19/2015  . ARF (acute renal failure) (HCC) 02/19/2015  . Hypokalemia 07/01/2012  . 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 DYSLIPIDEMIA 02/02/2009  . POLYCYTHEMIA 02/02/2009  . Essential hypertension, benign 02/02/2009  . MYOCARDIAL INFARCTION, INFERIOR WALL 02/02/2009  . CORONARY ATHEROSCLEROSIS NATIVE CORONARY ARTERY 02/02/2009  . BRADYCARDIA 02/02/2009  . GERD 02/02/2009    Past Surgical History:  Procedure Laterality Date  . cardiac stents    .  stent in kidney          Home Medications    Prior to Admission medications   Medication Sig Start Date End Date Taking? Authorizing Provider  ALPRAZolam Prudy Feeler) 0.5 MG tablet Take 0.5 mg by mouth 2 (two) times daily.    [provider]  aspirin EC 81 MG tablet Take 81 mg by mouth daily.    [provider]  carvedilol (COREG) 6.25 MG tablet Take 6.25 mg by mouth 2 (two) times daily.    [provider]  citalopram (CELEXA) 20 MG tablet Take 20 mg by mouth daily.    [provider]  clopidogrel (PLAVIX) 75 MG tablet Take 75 mg by mouth daily.    [provider]  esomeprazole (NEXIUM) 40 MG capsule Take 40 mg by mouth daily before breakfast.    [provider]  finasteride (PROSCAR) 5 MG tablet Take 5 mg by mouth daily.    [provider]  fludrocortisone (FLORINEF) 0.1 MG tablet Take 0.2 mg by mouth 2 (two) times daily.    [provider]  levothyroxine (SYNTHROID, LEVOTHROID) 100 MCG tablet Take 100 mcg by mouth daily before breakfast.    [provider]  predniSONE (DELTASONE) 5 MG tablet Take 7.5 mg by mouth daily.    [provider]  Tamsulosin HCl (FLOMAX) 0.4 MG CAPS Take 0.4 mg by mouth daily.    [provider]  testosterone cypionate (DEPOTESTOTERONE CYPIONATE) 100 MG/ML injection Inject 400  mg into the muscle every 14 (fourteen) days. For IM use only    [provider]    Family History No family history on file.  Social History Social History   Tobacco Use  . Smoking status: Heavy Tobacco Smoker    Packs/day: 1.00    Types: Cigarettes  Substance Use Topics  . Alcohol use: Yes    Comment: liquor every few days  . Drug use: No     Allergies   Statins   Review of Systems Review of Systems  Constitutional: Negative for fever.  HENT: Negative for sore throat.   Eyes: Negative for redness.  Respiratory: Negative for cough and shortness of breath.    Cardiovascular: Negative for chest pain.  Gastrointestinal: Negative for abdominal pain, diarrhea and vomiting.  Endocrine: Negative for polyuria.  Genitourinary: Negative for dysuria and flank pain.  Musculoskeletal: Negative for back pain and neck pain.  Skin: Negative for rash.  Neurological: Negative for speech difficulty, weakness, numbness and headaches.  Hematological: Does not bruise/bleed easily.  Psychiatric/Behavioral: Negative for confusion. The patient is nervous/anxious.      Physical Exam Updated Vital Signs BP 124/78 (BP Location: Left Arm)   Pulse 71   Temp 97.7 F (36.5 C) (Oral)   Resp 15   Wt 95.7 kg   SpO2 98%   BMI 28.62 kg/m   Physical Exam Vitals signs and nursing note reviewed.  Constitutional:      Appearance: Normal appearance. He is well-developed.  HENT:     Head: Atraumatic.     Nose: Nose normal.     Mouth/Throat:     Mouth: Mucous membranes are moist.     Pharynx: Oropharynx is clear.  Eyes:     General: No scleral icterus.    Conjunctiva/sclera: Conjunctivae normal.     Pupils: Pupils are equal, round, and reactive to light.  Neck:     Musculoskeletal: Normal range of motion and neck supple. No neck rigidity.     Trachea: No tracheal deviation.     Comments: No thyromegaly or thyroid tenderness. No bruits.  Cardiovascular:     Rate and Rhythm: Normal rate and regular rhythm.     Pulses: Normal pulses.     Heart sounds: Normal heart sounds. No murmur. No friction rub. No gallop.   Pulmonary:     Effort: Pulmonary effort is normal. No accessory muscle usage or respiratory distress.     Breath sounds: Normal breath sounds.  Abdominal:     General: Bowel sounds are normal. There is no distension.     Palpations: Abdomen is soft. There is no mass.     Tenderness: There is no abdominal tenderness. There is no guarding or rebound.     Hernia: No hernia is present.  Genitourinary:    Comments: No cva tenderness. Musculoskeletal:         General: No swelling or tenderness.     Right lower leg: No edema.     Left lower leg: No edema.  Skin:    General: Skin is warm and dry.     Findings: No rash.  Neurological:     Mental Status: He is alert.     Comments: Alert, speech clear/fluent. Motor/sens grossly intact bil. Steady gait.   Psychiatric:        Mood and Affect: Mood normal.      ED Treatments / Results  Labs (all labs ordered are listed, but only abnormal results are displayed) Results for orders  placed or performed during the hospital encounter of 12/25/18  Basic metabolic panel  Result Value Ref Range   Sodium 136 135 - 145 mmol/L   Potassium 4.6 3.5 - 5.1 mmol/L   Chloride 104 98 - 111 mmol/L   CO2 22 22 - 32 mmol/L   Glucose, Bld 115 (H) 70 - 99 mg/dL   BUN 17 8 - 23 mg/dL   Creatinine, Ser 4.091.66 (H) 0.61 - 1.24 mg/dL   Calcium 8.8 (L) 8.9 - 10.3 mg/dL   GFR calc non Af Amer 41 (L) >60 mL/min   GFR calc Af Amer 47 (L) >60 mL/min   Anion gap 10 5 - 15  CBC  Result Value Ref Range   WBC 13.2 (H) 4.0 - 10.5 K/uL   RBC 5.81 4.22 - 5.81 MIL/uL   Hemoglobin 13.4 13.0 - 17.0 g/dL   HCT 81.144.1 91.439.0 - 78.252.0 %   MCV 75.9 (L) 80.0 - 100.0 fL   MCH 23.1 (L) 26.0 - 34.0 pg   MCHC 30.4 30.0 - 36.0 g/dL   RDW 95.619.4 (H) 21.311.5 - 08.615.5 %   Platelets 200 150 - 400 K/uL   nRBC 0.0 0.0 - 0.2 %  Urinalysis, Routine w reflex microscopic  Result Value Ref Range   Color, Urine YELLOW YELLOW   APPearance CLEAR CLEAR   Specific Gravity, Urine 1.012 1.005 - 1.030   pH 6.0 5.0 - 8.0   Glucose, UA NEGATIVE NEGATIVE mg/dL   Hgb urine dipstick SMALL (A) NEGATIVE   Bilirubin Urine NEGATIVE NEGATIVE   Ketones, ur NEGATIVE NEGATIVE mg/dL   Protein, ur NEGATIVE NEGATIVE mg/dL   Nitrite NEGATIVE NEGATIVE   Leukocytes,Ua NEGATIVE NEGATIVE   RBC / HPF 0-5 0 - 5 RBC/hpf   WBC, UA 0-5 0 - 5 WBC/hpf   Bacteria, UA NONE SEEN NONE SEEN   Mucus PRESENT     EKG None  Radiology No results found.  Procedures Procedures  (including critical care time)  Medications Ordered in ED Medications - No data to display   Initial Impression / Assessment and Plan / ED Course  I have reviewed the triage vital signs and the nursing notes.  Pertinent labs & imaging results that were available during my care of the patient were reviewed by me and considered in my medical decision making (see chart for details).  Labs sent.   Reviewed nursing notes and prior charts for additional history.   Labs reviewed by me - ua neg for infxn. Cri, mildly progressed since prior. Wbc 13. Pt denies fever. No cough or uri symptoms. No abd pain. Patient indicates completely asymptomatic.  Patient attributes his earlier transient symptoms to anxiety. I discussed with him, that while possible, it was also possible that earlier symptoms were unrelated to anxiety, and thus, given earlier urine symptoms, to do limited lab eval, despite being asymptomatic.   On recheck, pt remains completely asymptomatic in ED and requests d/c to home, states he feels completely fine.  Vitals:   12/25/18 2058 12/25/18 2100  BP:  138/75  Pulse: 66 66  Resp:  16  Temp:    SpO2: 99% 100%   Pt remains asymptomatic and requests d/c. rec close pcp f/u, including for earlier symptoms, and CRI which has mildly progressed since prior.   Return precautions provided.      Final Clinical Impressions(s) / ED Diagnoses   Final diagnoses:  None    ED Discharge Orders    None  Cathren Laine, MD 12/26/18 1308

## 2018-12-25 NOTE — ED Triage Notes (Signed)
Pt reports that he woke up multiple times having to urinate and complaining of chills. Pt reports that when he woke up didn't feel right  States he has been pacing all day and unable to sit still. States he feels jumpy and scared. Pt states he is still having problems with urination. Pt called EMS and he was checked out and told EKG was good and was told to come to ED

## 2018-12-25 NOTE — ED Notes (Signed)
Pt tolerated PO Fluids without complication 

## 2019-01-30 ENCOUNTER — Encounter
Admit: 2019-01-30 | Discharge: 2019-01-31 | Payer: MEDICARE | Attending: Cardiovascular Disease | Primary: Cardiovascular Disease

## 2019-05-15 ENCOUNTER — Encounter
Admit: 2019-05-15 | Discharge: 2019-05-16 | Payer: MEDICARE | Attending: Cardiovascular Disease | Primary: Cardiovascular Disease

## 2019-05-15 DIAGNOSIS — I10 Essential (primary) hypertension: Secondary | ICD-10-CM

## 2019-05-15 DIAGNOSIS — F17200 Nicotine dependence, unspecified, uncomplicated: Secondary | ICD-10-CM

## 2019-05-15 DIAGNOSIS — I2510 Atherosclerotic heart disease of native coronary artery without angina pectoris: Secondary | ICD-10-CM

## 2019-05-15 DIAGNOSIS — E230 Hypopituitarism: Secondary | ICD-10-CM

## 2019-10-16 ENCOUNTER — Encounter
Admit: 2019-10-16 | Discharge: 2019-10-17 | Payer: MEDICARE | Attending: Cardiovascular Disease | Primary: Cardiovascular Disease

## 2019-10-16 DIAGNOSIS — F17200 Nicotine dependence, unspecified, uncomplicated: Principal | ICD-10-CM

## 2019-10-16 DIAGNOSIS — I2510 Atherosclerotic heart disease of native coronary artery without angina pectoris: Principal | ICD-10-CM

## 2019-10-16 DIAGNOSIS — E230 Hypopituitarism: Principal | ICD-10-CM

## 2019-10-16 DIAGNOSIS — I10 Essential (primary) hypertension: Principal | ICD-10-CM

## 2019-10-17 DIAGNOSIS — I209 Angina pectoris, unspecified: Principal | ICD-10-CM

## 2019-10-29 ENCOUNTER — Ambulatory Visit: Admit: 2019-10-29 | Discharge: 2019-10-29 | Payer: MEDICARE

## 2019-11-20 ENCOUNTER — Other Ambulatory Visit: Payer: Self-pay

## 2019-11-20 ENCOUNTER — Encounter: Payer: Self-pay | Admitting: Orthopaedic Surgery

## 2019-11-20 ENCOUNTER — Ambulatory Visit: Payer: Medicare Other | Admitting: Orthopaedic Surgery

## 2019-11-20 VITALS — Ht 72.0 in | Wt 211.0 lb

## 2019-11-20 DIAGNOSIS — G609 Hereditary and idiopathic neuropathy, unspecified: Secondary | ICD-10-CM

## 2019-11-20 DIAGNOSIS — M79671 Pain in right foot: Secondary | ICD-10-CM | POA: Diagnosis not present

## 2019-11-20 DIAGNOSIS — M79672 Pain in left foot: Secondary | ICD-10-CM

## 2019-11-20 DIAGNOSIS — F17210 Nicotine dependence, cigarettes, uncomplicated: Secondary | ICD-10-CM

## 2019-11-20 NOTE — Progress Notes (Signed)
Subjective:    Patient ID: Mathew Rodriguez, male    DOB: [birth year], 74 y.o.   MRN: [id number]  HPI He complains of lack of sensation in both feet for several years.  It is getting worse.  He has some swelling at times.  He has no trauma.  He has not noticed any color changes of his feet.  He has pain more in the day than at night.  He is concerned that is is not getting any better.  He has been smoking for over 50 years.  He is not diabetic.   Review of Systems  Constitutional: Positive for activity change.  Musculoskeletal: Positive for arthralgias and joint swelling.  All other systems reviewed and are negative.  For Review of Systems, all other systems reviewed and are negative.  The following is a summary of the past history medically, past history surgically, known current medicines, social history and family history.  This information is gathered electronically by the computer from prior information and documentation.  I review this each visit and have found including this information at this point in the chart is beneficial and informative.   Past Medical History:  Diagnosis Date  . AAA (abdominal aortic aneurysm) (HCC)   . Coronary artery disease    16 stents placed  . Hypertension   . Hypopituitarism (HCC)   . Myocardial infarction (HCC)   . Renal artery stenosis (HCC)    s/p stent  . Thyroid disease     Past Surgical History:  Procedure Laterality Date  . cardiac stents    . stent in kidney      Current Outpatient Medications on File Prior to Visit  Medication Sig Dispense Refill  . ALPRAZolam (XANAX) 0.5 MG tablet Take 0.5 mg by mouth 2 (two) times daily.    Marland Kitchen aspirin EC 81 MG tablet Take 81 mg by mouth daily.    . carvedilol (COREG) 6.25 MG tablet Take 6.25 mg by mouth 2 (two) times daily.    . citalopram (CELEXA) 20 MG tablet Take 20 mg by mouth daily.    . clopidogrel (PLAVIX) 75 MG tablet Take 75 mg by mouth daily.    . finasteride (PROSCAR) 5 MG  tablet Take 5 mg by mouth daily.    . fludrocortisone (FLORINEF) 0.1 MG tablet Take 0.2 mg by mouth 2 (two) times daily.    Marland Kitchen levothyroxine (SYNTHROID, LEVOTHROID) 100 MCG tablet Take 100 mcg by mouth daily before breakfast.    . potassium chloride SA (KLOR-CON) 20 MEQ tablet Take 20 mEq by mouth daily.    . predniSONE (DELTASONE) 5 MG tablet Take 7.5 mg by mouth daily.    . rosuvastatin (CRESTOR) 5 MG tablet Take by mouth.    . Tamsulosin HCl (FLOMAX) 0.4 MG CAPS Take 0.4 mg by mouth daily.    Marland Kitchen testosterone cypionate (DEPOTESTOSTERONE CYPIONATE) 200 MG/ML injection Inject 200 mg into the muscle every 14 (fourteen) days.     No current facility-administered medications on file prior to visit.    Social History   Socioeconomic History  . Marital status: Married    Spouse name: Not on file  . Number of children: Not on file  . Years of education: Not on file  . Highest education level: Not on file  Occupational History  . Not on file  Tobacco Use  . Smoking status: Current Every Day Smoker    Packs/day: 1.00    Types: Cigarettes  . Smokeless tobacco: Current User  Substance and Sexual Activity  . Alcohol use: Yes    Comment: liquor every few days  . Drug use: No  . Sexual activity: Not on file  Other Topics Concern  . Not on file  Social History Narrative  . Not on file   Social Determinants of Health   Financial Resource Strain:   . Difficulty of Paying Living Expenses:   Food Insecurity:   . Worried About Programme researcher, broadcasting/film/video in the Last Year:   . Barista in the Last Year:   Transportation Needs:   . Freight forwarder (Medical):   Marland Kitchen Lack of Transportation (Non-Medical):   Physical Activity:   . Days of Exercise per Week:   . Minutes of Exercise per Session:   Stress:   . Feeling of Stress :   Social Connections:   . Frequency of Communication with Friends and Family:   . Frequency of Social Gatherings with Friends and Family:   . Attends Religious  Services:   . Active Member of Clubs or Organizations:   . Attends Banker Meetings:   Marland Kitchen Marital Status:   Intimate Partner Violence:   . Fear of Current or Ex-Partner:   . Emotionally Abused:   Marland Kitchen Physically Abused:   . Sexually Abused:     Family History  Problem Relation Age of Onset  . Cancer Sister   . Cancer Brother   . Cancer Sister   . Cancer Sister     Ht 6' (1.829 m)   Wt 211 lb (95.7 kg)   BMI 28.62 kg/m   Body mass index is 28.62 kg/m.     Objective:   Physical Exam Vitals and nursing note reviewed.  Constitutional:      Appearance: He is well-developed.  HENT:     Head: Normocephalic and atraumatic.  Eyes:     Conjunctiva/sclera: Conjunctivae normal.     Pupils: Pupils are equal, round, and reactive to light.  Cardiovascular:     Rate and Rhythm: Normal rate and regular rhythm.  Pulmonary:     Effort: Pulmonary effort is normal.  Abdominal:     Palpations: Abdomen is soft.  Musculoskeletal:     Cervical back: Normal range of motion and neck supple.       Feet:  Skin:    General: Skin is warm and dry.  Neurological:     Mental Status: He is alert and oriented to person, place, and time.     Cranial Nerves: No cranial nerve deficit.     Motor: No abnormal muscle tone.     Coordination: Coordination normal.     Deep Tendon Reflexes: Reflexes are normal and symmetric. Reflexes normal.  Psychiatric:        Behavior: Behavior normal.        Thought Content: Thought content normal.        Judgment: Judgment normal.    I had him walk up and down my hallway four times.  There was slight color change to slightly less pink but there was no change in his weak pulses.       Assessment & Plan:   Encounter Diagnoses  Name Primary?  . Bilateral foot pain Yes  . Peripheral neuropathy, idiopathic   . Nicotine dependence, cigarettes, uncomplicated    He has history of heart disease and vascular disease.    I will get EMGs of the lower  extremities.  After getting those results, I will have him  see his vascular surgeon.  Return in two weeks.  Electronically Signed Sanjuana Kava, MD 4/1/20218:41 AM

## 2019-12-04 ENCOUNTER — Ambulatory Visit: Payer: Medicare Other | Admitting: Orthopaedic Surgery

## 2019-12-18 ENCOUNTER — Encounter
Admit: 2019-12-18 | Discharge: 2019-12-19 | Payer: MEDICARE | Attending: Cardiovascular Disease | Primary: Cardiovascular Disease

## 2019-12-18 DIAGNOSIS — I10 Essential (primary) hypertension: Principal | ICD-10-CM

## 2019-12-18 DIAGNOSIS — F17200 Nicotine dependence, unspecified, uncomplicated: Principal | ICD-10-CM

## 2019-12-18 DIAGNOSIS — E230 Hypopituitarism: Principal | ICD-10-CM

## 2019-12-18 DIAGNOSIS — I2510 Atherosclerotic heart disease of native coronary artery without angina pectoris: Principal | ICD-10-CM

## 2019-12-18 DIAGNOSIS — N400 Enlarged prostate without lower urinary tract symptoms: Principal | ICD-10-CM

## 2019-12-18 MED ORDER — FINASTERIDE 5 MG TABLET: 5 mg | tablet | Freq: Every day | 12 refills | 30 days | Status: AC

## 2019-12-18 MED ORDER — FINASTERIDE 5 MG TABLET
ORAL_TABLET | Freq: Every day | ORAL | 12 refills | 30 days | Status: CP
Start: 2019-12-18 — End: 2019-12-18

## 2019-12-18 MED ORDER — ISOSORBIDE MONONITRATE ER 30 MG TABLET,EXTENDED RELEASE 24 HR
ORAL_TABLET | Freq: Every day | ORAL | 6 refills | 30.00000 days | Status: CP
Start: 2019-12-18 — End: 2020-01-17

## 2019-12-25 ENCOUNTER — Ambulatory Visit: Payer: Medicare Other | Admitting: Orthopaedic Surgery

## 2019-12-31 ENCOUNTER — Encounter: Admit: 2019-12-31 | Discharge: 2020-01-01 | Payer: MEDICARE

## 2020-01-01 MED ORDER — ROSUVASTATIN 5 MG TABLET
ORAL_TABLET | Freq: Every evening | ORAL | 0 refills | 30 days | Status: CP
Start: 2020-01-01 — End: 2020-01-31

## 2020-01-15 ENCOUNTER — Encounter
Admit: 2020-01-15 | Discharge: 2020-01-16 | Payer: MEDICARE | Attending: Cardiovascular Disease | Primary: Cardiovascular Disease

## 2020-01-15 DIAGNOSIS — E230 Hypopituitarism: Principal | ICD-10-CM

## 2020-01-15 DIAGNOSIS — I2510 Atherosclerotic heart disease of native coronary artery without angina pectoris: Principal | ICD-10-CM

## 2020-01-15 DIAGNOSIS — I10 Essential (primary) hypertension: Principal | ICD-10-CM

## 2020-01-15 DIAGNOSIS — F17200 Nicotine dependence, unspecified, uncomplicated: Principal | ICD-10-CM

## 2020-01-15 DIAGNOSIS — I200 Unstable angina: Principal | ICD-10-CM

## 2020-01-29 ENCOUNTER — Encounter: Payer: Self-pay | Admitting: Neurology

## 2020-01-29 ENCOUNTER — Ambulatory Visit: Payer: Medicare Other | Admitting: Neurology

## 2020-01-29 ENCOUNTER — Telehealth: Payer: Self-pay | Admitting: Neurology

## 2020-01-29 VITALS — BP 148/91 | HR 78 | Ht 72.0 in | Wt 208.0 lb

## 2020-01-29 DIAGNOSIS — R202 Paresthesia of skin: Secondary | ICD-10-CM | POA: Diagnosis not present

## 2020-01-29 DIAGNOSIS — R269 Unspecified abnormalities of gait and mobility: Secondary | ICD-10-CM | POA: Diagnosis not present

## 2020-01-29 DIAGNOSIS — R3915 Urgency of urination: Secondary | ICD-10-CM

## 2020-01-29 NOTE — Telephone Encounter (Signed)
[employer] medicare order sent to [facility]. No auth they will reach out to the patient to schedule.  

## 2020-01-29 NOTE — Progress Notes (Signed)
PATIENT: Mathew Rodriguez DOB: 01-05-1946  Chief Complaint  Patient presents with  . Pain/Numbness    Referred to discuss need for NCV/EMG. He has pain and numbness in his bilateral feet. His symptoms started 6-8 months and cause his difficulty when walking.    . Orthopaedics    Mathew Mclean, MD - referring provider  . PCP    Mathew Robert, MD     HISTORICAL  Mathew Rodriguez is a 74 year old male, seen in request by his orthopedic surgeon Dr. Hilda Rodriguez, Mathew Rodriguez and primary care physician Dr.,Mathew, Rodriguez Senior for evaluation of bilateral lower extremity numbness, difficulty walking  I have reviewed and summarized the referring note from the referring physician.  He has past medical history of hypertension, coronary artery disease, multiple stent, hyperlipidemia,  He used to work as a Environmental health practitioner, reported the incident more than 40 years ago, a piece of large log fell on his head, he was knocked into unconsciousness, withdrawal fracture, scalp laceration, he was not to the ground could not move, could not get up by himself, he denies cervical or lumbar injury at that time.  He has pituitary insufficiency, is now taking testosterone, prednisone  Around January 2021, he began to notice bilateral feet paresthesia, symmetric, mainly involving bilateral plantar surface and toes, he described burning sharp pain, sometimes he cannot feel the ground when he walks, he has developed unsteady gait, noticed tendency of bilateral first toe extension  He also complains of worsening urinary urgency, incontinence occasionally since 2019, gradually getting worse  He denies bilateral upper extremity paresthesia, or weakness   Laboratory evaluation in 2021, BMP showed normal creatinine 1.24, GFR of 67, calcium mildly decreased 8.3, CBC showed hemoglobin of 12.2, decreased MCV of 75, RDW was elevated 18.6   REVIEW OF SYSTEMS: Full 14 system review of systems performed and notable only for  as  above All other review of systems were negative.  ALLERGIES: Allergies  Allergen Reactions  . Other     myalgias  . Statins   . Doxycycline Rash    Hand blistered in the sun    HOME MEDICATIONS: Current Outpatient Medications  Medication Sig Dispense Refill  . ALPRAZolam (XANAX) 0.5 MG tablet Take 0.5 mg by mouth 2 (two) times daily.    Marland Kitchen amLODipine (NORVASC) 5 MG tablet Take 5 mg by mouth daily.    Marland Kitchen aspirin EC 81 MG tablet Take 81 mg by mouth daily.    . carvedilol (COREG) 6.25 MG tablet Take 6.25 mg by mouth 2 (two) times daily.    . citalopram (CELEXA) 20 MG tablet Take 20 mg by mouth daily.    . clopidogrel (PLAVIX) 75 MG tablet Take 75 mg by mouth daily.    Marland Kitchen ezetimibe (ZETIA) 10 MG tablet Take 10 mg by mouth daily.    . finasteride (PROSCAR) 5 MG tablet Take 5 mg by mouth daily.    . fludrocortisone (FLORINEF) 0.1 MG tablet Take 0.2 mg by mouth 2 (two) times daily.    . isosorbide mononitrate (IMDUR) 30 MG 24 hr tablet Take 30 mg by mouth daily.    Marland Kitchen levothyroxine (SYNTHROID, LEVOTHROID) 100 MCG tablet Take 100 mcg by mouth daily before breakfast.    . lisinopril (ZESTRIL) 20 MG tablet Take 20 mg by mouth daily.    . potassium chloride SA (KLOR-CON) 20 MEQ tablet Take 20 mEq by mouth daily.    . predniSONE (DELTASONE) 5 MG tablet Take 10 mg by mouth daily.     Marland Kitchen  rosuvastatin (CRESTOR) 5 MG tablet Take by mouth.    . Tamsulosin HCl (FLOMAX) 0.4 MG CAPS Take 0.4 mg by mouth daily.    Marland Kitchen testosterone cypionate (DEPOTESTOSTERONE CYPIONATE) 200 MG/ML injection Inject 200 mg into the muscle every 14 (fourteen) days.     No current facility-administered medications for this visit.    PAST MEDICAL HISTORY: Past Medical History:  Diagnosis Date  . AAA (abdominal aortic aneurysm) (HCC)   . Coronary artery disease    16 stents placed  . Hypertension   . Hypopituitarism (HCC)   . Myocardial infarction (HCC)   . Pain in both feet   . Renal artery stenosis (HCC)    s/p stent   . Thyroid disease     PAST SURGICAL HISTORY: Past Surgical History:  Procedure Laterality Date  . cardiac stents    . stent in kidney      FAMILY HISTORY: Family History  Problem Relation Age of Onset  . Other Mother        "old age"  . Heart attack Father   . Lung cancer Sister   . Cancer Brother        unsure of type  . Cancer Sister        unsure of type  . Cancer Sister        unsure of type    SOCIAL HISTORY: Social History   Socioeconomic History  . Marital status: Widowed    Spouse name: Not on file  . Number of children: 3  . Years of education: 6th grade  . Highest education level: Not on file  Occupational History  . Occupation: Retired  Tobacco Use  . Smoking status: Current Every Day Smoker    Packs/day: 1.00    Types: Cigarettes  . Smokeless tobacco: Current User  Substance and Sexual Activity  . Alcohol use: Yes    Comment: liquor every few days  . Drug use: Never  . Sexual activity: Not on file  Other Topics Concern  . Not on file  Social History Narrative   Lives at home alone.   Right-handed.   Drinks 2-3 cans of soda per day, occasional coffee.      Social Determinants of Health   Financial Resource Strain:   . Difficulty of Paying Living Expenses:   Food Insecurity:   . Worried About Programme researcher, broadcasting/film/video in the Last Year:   . Barista in the Last Year:   Transportation Needs:   . Freight forwarder (Medical):   Marland Kitchen Lack of Transportation (Non-Medical):   Physical Activity:   . Days of Exercise per Week:   . Minutes of Exercise per Session:   Stress:   . Feeling of Stress :   Social Connections:   . Frequency of Communication with Friends and Family:   . Frequency of Social Gatherings with Friends and Family:   . Attends Religious Services:   . Active Member of Clubs or Organizations:   . Attends Banker Meetings:   Marland Kitchen Marital Status:   Intimate Partner Violence:   . Fear of Current or Ex-Partner:    . Emotionally Abused:   Marland Kitchen Physically Abused:   . Sexually Abused:      PHYSICAL EXAM   Vitals:   01/29/20 1430  BP: (!) 148/91  Pulse: 78  Weight: 208 lb (94.3 kg)  Height: 6' (1.829 m)   Not recorded     Body mass index is 28.21 kg/m.  PHYSICAL EXAMNIATION:  Gen: NAD, conversant, well nourised, well groomed                     Cardiovascular: Regular rate rhythm, no peripheral edema, warm, nontender. Eyes: Conjunctivae clear without exudates or hemorrhage Neck: Supple, no carotid bruits. Pulmonary: Clear to auscultation bilaterally   NEUROLOGICAL EXAM:  MENTAL STATUS: Speech:    Speech is normal; fluent and spontaneous with normal comprehension.  Cognition:     Orientation to time, place and person     Normal recent and remote memory     Normal Attention span and concentration     Normal Language, naming, repeating,spontaneous speech     Fund of knowledge   CRANIAL NERVES: CN II: Visual fields are full to confrontation. Pupils are round equal and briskly reactive to light. CN III, IV, VI: extraocular movement are normal. No ptosis. CN V: Facial sensation is intact to light touch CN VII: Face is symmetric with normal eye closure  CN VIII: Hearing is normal to causal conversation. CN IX, X: Phonation is normal. CN XI: Head turning and shoulder shrug are intact  MOTOR: Tendency for bilateral first toes extension, there was no significant upper extremity proximal distal muscle weakness, normal tone of bilateral lower extremity, mild bilateral hip flexion weakness, left worse than right, mild bilateral toe flexion, extension weakness,   REFLEXES: Reflexes are 2+ and symmetric at the biceps, triceps, knees, and absent at ankles. Plantar responses are extensor bilaterally  SENSORY: Length dependent decreased to light touch, pinprick to mid shin, and decreased vibratory sensation to knee level  COORDINATION: There is no trunk or limb dysmetria  noted.  GAIT/STANCE: He needs to push on to get up from seated position, wide-based, mildly unsteady, he has difficulty standing up on tiptoe, heels, could not perform tandem walking,  Romberg signs was positive  DIAGNOSTIC DATA (LABS, IMAGING, TESTING) - I reviewed patient records, labs, notes, testing and imaging myself where available.   ASSESSMENT AND PLAN  CONLEE SLITER is a 74 y.o. male   Gradual onset bilateral feet paresthesia, History of injury more than 40 years ago, tree log fell to his head with jaw fracture, transient loss of consciousness  On examination, he has mild bilateral hip flexion weakness, left worse than right, mild bilateral toe flexion extension weakness, length dependent sensory changes, brisk bilateral knee reflexes, with absent ankle reflex.  He has unsteady gait, positive Romberg signs, bilateral Babinski signs,  Differentiation diagnosis include peripheral neuropathy, with superimposed lumbar radiculopathy, and cervical spondylitic myelopathy  EMG nerve conduction study  Laboratory evaluations for potential etiology  MRI of lumbar spine  MRI of cervical spine  Marcial Pacas, M.D. Ph.D.  [facility] [address], [place], [place] [address] Ph: [phone] Fax: [phone]  CC: Sanjuana Kava, MD,Mathew, Annie Main, MD

## 2020-01-31 LAB — TSH: TSH: <0.005 u[IU]/mL — ABNORMAL LOW (ref 0.450–4.500)

## 2020-01-31 LAB — VITAMIN D 25 HYDROXY (VIT D DEFICIENCY, FRACTURES): Vit D, 25-Hydroxy: 42 ng/mL (ref 30.0–100.0)

## 2020-01-31 LAB — SEDIMENTATION RATE: Sed Rate: 32 mm/hr — ABNORMAL HIGH (ref 0–30)

## 2020-01-31 LAB — VITAMIN B12: Vitamin B-12: 422 pg/mL (ref 232–1245)

## 2020-01-31 LAB — HGB A1C W/O EAG: Hgb A1c MFr Bld: 5.8 % — ABNORMAL HIGH (ref 4.8–5.6)

## 2020-01-31 LAB — B. BURGDORFI ANTIBODIES: Lyme IgG/IgM Ab: <0.91 {ISR} (ref 0.00–0.90)

## 2020-01-31 LAB — CK: Total CK: 14 U/L — ABNORMAL LOW (ref 41–331)

## 2020-01-31 LAB — RPR: RPR Ser Ql: NONREACTIVE

## 2020-02-02 ENCOUNTER — Telehealth: Payer: Self-pay | Admitting: Neurology

## 2020-02-02 DIAGNOSIS — R7989 Other specified abnormal findings of blood chemistry: Secondary | ICD-10-CM | POA: Insufficient documentation

## 2020-02-02 NOTE — Telephone Encounter (Signed)
Please call patient laboratory evaluation showed significantly decrease the TSH less than 0.005, this could indicate hyperthyroidism  I have forward laboratory evaluation to his primary care physician Dr. Smith Robert,  He needs to contact his primary care physician for repeat laboratory evaluations  Rest of the laboratory evaluation showed no significant abnormalities

## 2020-02-03 ENCOUNTER — Other Ambulatory Visit: Payer: Self-pay | Admitting: Neurology

## 2020-02-03 DIAGNOSIS — Z77018 Contact with and (suspected) exposure to other hazardous metals: Secondary | ICD-10-CM

## 2020-02-03 NOTE — Telephone Encounter (Signed)
I was able to speak to the patient. He verbalized understanding of lab results. Says he is going to see his PCP on 02/05/20 and will discuss this with him.

## 2020-02-13 ENCOUNTER — Other Ambulatory Visit: Payer: Self-pay

## 2020-02-13 ENCOUNTER — Ambulatory Visit
Admission: RE | Admit: 2020-02-13 | Discharge: 2020-02-13 | Disposition: A | Discharge status: Home / Self Care | Payer: Medicare Other | Source: Ambulatory Visit | Attending: Neurology | Admitting: Neurology

## 2020-02-13 DIAGNOSIS — Z77018 Contact with and (suspected) exposure to other hazardous metals: Secondary | ICD-10-CM

## 2020-02-13 DIAGNOSIS — R3915 Urgency of urination: Secondary | ICD-10-CM

## 2020-02-13 DIAGNOSIS — R202 Paresthesia of skin: Secondary | ICD-10-CM

## 2020-02-13 DIAGNOSIS — R269 Unspecified abnormalities of gait and mobility: Secondary | ICD-10-CM

## 2020-02-16 ENCOUNTER — Telehealth: Payer: Self-pay | Admitting: Neurology

## 2020-02-16 NOTE — Telephone Encounter (Signed)
IMPRESSION: Slightly abnormal MRI scan cervical spine without contrast showing only mild disc degenerative changes at C4-and C5-6 but without significant compression.  IMPRESSION: Abnormal MRI scan lumbar spine without contrast showing prominent disc and facet degenerative change at L4-5 with moderate bilateral foraminal narrowing.  Please call patient, MRI of lumbar and cervical spine only showed mild degenerative changes, there is no evidence of spinal cord or nerve root compression  Make sure he keep his EMG nerve conduction study appointment on March 03, 2020

## 2020-02-16 NOTE — Telephone Encounter (Signed)
I have spoken to the patient and verbalized understanding of his results. He will keep his NCV/EMG appt.

## 2020-03-03 ENCOUNTER — Ambulatory Visit: Payer: Medicare Other | Admitting: Neurology

## 2020-03-03 ENCOUNTER — Ambulatory Visit (INDEPENDENT_AMBULATORY_CARE_PROVIDER_SITE_OTHER): Payer: Medicare Other | Admitting: Neurology

## 2020-03-03 DIAGNOSIS — R3915 Urgency of urination: Secondary | ICD-10-CM

## 2020-03-03 DIAGNOSIS — G6181 Chronic inflammatory demyelinating polyneuritis: Secondary | ICD-10-CM

## 2020-03-03 DIAGNOSIS — R269 Unspecified abnormalities of gait and mobility: Secondary | ICD-10-CM | POA: Diagnosis not present

## 2020-03-03 DIAGNOSIS — R202 Paresthesia of skin: Secondary | ICD-10-CM | POA: Diagnosis not present

## 2020-03-03 NOTE — Progress Notes (Signed)
HISTORICAL  Mathew Rodriguez is a 74 year old male, seen in request by his orthopedic surgeon Dr. Luna Glasgow, Patrick Jupiter and primary care physician Dr.,Kikel, Annie Main for evaluation of bilateral lower extremity numbness, difficulty walking  I have reviewed and summarized the referring note from the referring physician.  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  He has pituitary insufficiency, is now taking testosterone, prednisone  Around January 2021, he began to notice bilateral feet paresthesia, symmetric, mainly involving bilateral plantar surface and toes, he described burning sharp pain, sometimes he cannot feel the ground when he walks, he has developed unsteady gait, noticed tendency of bilateral first toe extension  He also complains of worsening urinary urgency, incontinence occasionally since 2019, gradually getting worse  He denies bilateral upper extremity paresthesia, or weakness  Laboratory evaluation in 2021, BMP showed normal creatinine 1.24, GFR of 67, calcium mildly decreased 8.3, CBC showed hemoglobin of 12.2, decreased MCV of 75, RDW was elevated 18.6   UPDATE March 03 2020: He 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  Lab showed mildly elevated A1C 5.8, rest were normal, TSH, CK, RPR, 12, ESR, Vit D, Lyme titer.   Personally reviewed MRI lumbar, cervical on February 14 2020, mild multi-level degenerative changes, no evidence of canal or foraminal  stenosis.  He complains of progressive worsening gait abnormality since Jan 2021, continue to progress, no incontinence, no hands weakness  REVIEW OF SYSTEMS: Full 14 system review of systems pe demyelinating neuropathy, as evident by prolongedrformed and notable only for  as above All other review of systems were negative.  ALLERGIES: Allergies  Allergen Reactions  . Other     myalgias  . Statins   . Doxycycline Rash    Hand blistered in the sun    HOME MEDICATIONS: Current Outpatient Medications  Medication Sig Dispense Refill  . ALPRAZolam (XANAX) 0.5 MG tablet Take 0.5 mg by mouth 2 (two) times daily.    Marland Kitchen amLODipine (NORVASC) 5 MG tablet Take 5 mg by mouth daily.    Marland Kitchen aspirin EC 81 MG tablet Take 81 mg by mouth daily.    . carvedilol (COREG) 6.25 MG tablet Take 6.25 mg by mouth 2 (two) times daily.    . citalopram (CELEXA) 20 MG tablet Take 20 mg by mouth daily.    . clopidogrel (PLAVIX) 75 MG tablet Take 75 mg by mouth daily.    Marland Kitchen ezetimibe (ZETIA) 10 MG tablet Take 10 mg by mouth daily.    . finasteride (PROSCAR) 5 MG tablet Take 5 mg by mouth daily.    . fludrocortisone (FLORINEF) 0.1 MG tablet Take 0.2 mg by mouth 2 (two) times daily.    . isosorbide mononitrate (IMDUR) 30 MG 24 hr tablet Take 30 mg by mouth daily.    Marland Kitchen levothyroxine (SYNTHROID, LEVOTHROID) 100 MCG tablet Take 100 mcg by mouth daily before breakfast.    . lisinopril (ZESTRIL) 20 MG tablet Take 20 mg by mouth daily.    . potassium chloride SA (KLOR-CON)  20 MEQ tablet Take 20 mEq by mouth daily.    . predniSONE (DELTASONE) 5 MG tablet Take 10 mg by mouth daily.     . rosuvastatin (CRESTOR) 5 MG tablet Take by mouth.    . Tamsulosin HCl (FLOMAX) 0.4 MG CAPS Take 0.4 mg by mouth daily.    Marland Kitchen testosterone cypionate (DEPOTESTOSTERONE CYPIONATE) 200 MG/ML injection Inject 200 mg into the muscle every 14 (fourteen) days.     No current facility-administered medications for this visit.    PAST MEDICAL  HISTORY: Past Medical History:  Diagnosis Date  . AAA (abdominal aortic aneurysm) ([place])   . Coronary artery disease    16 stents placed  . Hypertension   . Hypopituitarism ([place])   . Myocardial infarction ([place])   . Pain in both feet   . Renal artery stenosis (HCC)    s/p stent  . Thyroid disease     PAST SURGICAL HISTORY: Past Surgical History:  Procedure Laterality Date  . cardiac stents    . stent in kidney      FAMILY HISTORY: Family History  Problem Relation Age of Onset  . Other Mother        "old age"  . Heart attack Father   . Lung cancer Sister   . Cancer Brother        unsure of type  . Cancer Sister        unsure of type  . Cancer Sister        unsure of type    SOCIAL HISTORY: Social History   Socioeconomic History  . Marital status: Widowed    Spouse name: Not on file  . Number of children: 3  . Years of education: 6th grade  . Highest education level: Not on file  Occupational History  . Occupation: Retired  Tobacco Use  . Smoking status: Current Every Day Smoker    Packs/day: 1.00    Types: Cigarettes  . Smokeless tobacco: Current User  Substance and Sexual Activity  . Alcohol use: Yes    Comment: liquor every few days  . Drug use: Never  . Sexual activity: Not on file  Other Topics Concern  . Not on file  Social History Narrative   Lives at home alone.   Right-handed.   Drinks 2-3 cans of soda per day, occasional coffee.      Social Determinants of Health   Financial Resource Strain:   . Difficulty of Paying Living Expenses:   Food Insecurity:   . Worried About Charity fundraiser in the Last Year:   . Arboriculturist in the Last Year:   Transportation Needs:   . Film/video editor (Medical):   Marland Kitchen Lack of Transportation (Non-Medical):   Physical Activity:   . Days of Exercise per Week:   . Minutes of Exercise per Session:   Stress:   . Feeling of Stress :   Social Connections:   . Frequency of Communication with  Friends and Family:   . Frequency of Social Gatherings with Friends and Family:   . Attends Religious Services:   . Active Member of Clubs or Organizations:   . Attends Archivist Meetings:   Marland Kitchen Marital Status:   Intimate Partner Violence:   . Fear of Current or Ex-Partner:   . Emotionally Abused:   Marland Kitchen Physically Abused:   . Sexually Abused:      PHYSICAL EXAM   There were no vitals filed for this visit.  Not recorded     There is no height or weight on file to calculate BMI.  PHYSICAL EXAMNIATION:  Gen: NAD, conversant, well nourised, well groomed                     Cardiovascular: Regular rate rhythm, no peripheral edema, warm, nontender. Eyes: Conjunctivae clear without exudates or hemorrhage Neck: Supple, no carotid bruits. Pulmonary: Clear to auscultation bilaterally   NEUROLOGICAL EXAM:  MENTAL STATUS: Speech:    Speech is normal; fluent and spontaneous with normal comprehension.  Cognition:     Orientation to time, place and person     Normal recent and remote memory     Normal Attention span and concentration     Normal Language, naming, repeating,spontaneous speech     Fund of knowledge   CRANIAL NERVES: CN II: Visual fields are full to confrontation. Pupils are round equal and briskly reactive to light. CN III, IV, VI: extraocular movement are normal. No ptosis. CN V: Facial sensation is intact to light touch CN VII: Face is symmetric with normal eye closure  CN VIII: Hearing is normal to causal conversation. CN IX, X: Phonation is normal. CN XI: Head turning and shoulder shrug are intact  MOTOR: Mild bilateral toe flexion/extension weakness, rest proximal muscle strength were normal.  REFLEXES: Reflexes are absent ,  Plantar responses are extensor bilaterally  SENSORY: Length dependent decreased to light touch, pinprick to mid shin, and decreased vibratory sensation to knee level, absent to propriception  COORDINATION: There is no trunk  or limb dysmetria noted.  GAIT/STANCE: He needs to push on to get up from seated position, wide-based, mildly unsteady, he has difficulty standing up on tiptoe, heels, could not perform tandem walking,  Romberg signs was positive  DIAGNOSTIC DATA (LABS, IMAGING, TESTING) - I reviewed patient records, labs, notes, testing and imaging myself where available.   ASSESSMENT AND PLAN  ANIL HAVARD is a 74 y.o. 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  Lumbar puncture if confirmed cytoalbumin dissociate, will do IVIG.  Complete lab evaluation  Marcial Pacas, M.D. Ph.D.  [facility] [address] [place], [place] [address] Ph: [phone] Fax: [phone]  CC: Sanjuana Kava, MD,Kikel, Annie Main, MD

## 2020-03-03 NOTE — Progress Notes (Signed)
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

## 2020-03-03 NOTE — Procedures (Signed)
Full Name: Mathew Rodriguez Gender: Male MRN #: [id number] Date of Birth: July 19, 1946    Visit Date: 03/03/2020 09:20 Age: 73 Years Examining Physician: Levert Feinstein, MD  Referring Physician: Levert Feinstein, MD Height: 6 feet 0 inch History: 74 years old male presenting with subacute onset progressive bilateral lower extremity paresthesia, gait abnormality. gait abnormalities.  Summary:  Nerve Conduction Studies: Bilateral superficial peroneal, right sural, ulnar sensory responses were absent. Right radial sensory showed moderately prolonged peak latency with moderate low SNAP amplitude.   Right tibial, bilateral peroneal to EDB, right ulnar motor responses all showed decreased CMAP amplitude, with mild to moderately slow conduction velocity.  Right tibial and ulnar F wave latency were prolonged.  EMG:  Selected needle examination of bilateral lower extremity, bilateral lumbosacral paraspinal muscles; right upper extremity, right cervical paraspinal muscles showed chronic neuropathic changes, with large motor unit potential with decreased recruitment patterns. There was no evidence of active denervations.   Conclusion: This is an abnormal study. 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    ------------------------------- Levert Feinstein, M.D. Ph.D.  [facility] [address] [place], [place] [address] Tel: [phone] Fax: [phone]  Verbal informed consent was obtained from the patient, patient was informed of potential risk of procedure, including bruising, bleeding, hematoma formation, infection, muscle weakness, muscle pain, numbness, among others.        MNC    Nerve / Sites Muscle Latency Ref. Amplitude Ref. Rel Amp Segments Distance Velocity Ref. Area    ms ms mV mV %  cm m/s m/s mVms  R Ulnar - ADM     Wrist ADM 2.9 ?3.3 2.8 ?6.0 100 Wrist - ADM 7   4.9     B.Elbow ADM 7.9  3.0  104 B.Elbow -  Wrist 15 30 ?49 7.1     A.Elbow ADM 10.7  3.9  131 A.Elbow - B.Elbow 10 35 ?49 11.8  R Peroneal - EDB     Ankle EDB 5.4 ?6.5 0.9 ?2.0 100 Ankle - EDB 9   3.3     Fib head EDB 15.6  0.6  59.2 Fib head - Ankle 31 30 ?44 3.9     Pop fossa EDB 19.3  0.6  103 Pop fossa - Fib head 10 27 ?44 3.5         Pop fossa - Ankle      L Peroneal - EDB     Ankle EDB 5.8 ?6.5 1.3 ?2.0 100 Ankle - EDB 9   6.3     Fib head EDB 16.9  1.2  91 Fib head - Ankle 31 28 ?44 3.8     Pop fossa EDB 19.9  1.1  95.9 Pop fossa - Fib head 10 33 ?44 3.7         Pop fossa - Ankle      R Tibial - AH     Ankle AH 5.0 ?5.8 0.8 ?4.0 100 Ankle - AH 9   4.0     Pop fossa AH 21.0  0.4  47 Pop fossa - Ankle 44 28 ?41 2.9             SNC    Nerve / Sites Rec. Site Peak Lat Ref.  Amp Ref. Segments Distance    ms ms V V  cm  R Radial - Anatomical snuff box (Forearm)     Forearm Wrist 3.2 ?2.9 7 ?15 Forearm -  Wrist 10  R Sural - Ankle (Calf)     Calf Ankle NR ?4.4 NR ?6 Calf - Ankle 14  R Superficial peroneal - Ankle     Lat leg Ankle NR ?4.4 NR ?6 Lat leg - Ankle 14  L Superficial peroneal - Ankle     Lat leg Ankle NR ?4.4 NR ?6 Lat leg - Ankle 14  R Ulnar - Orthodromic, (Dig V, Mid palm)     Dig V Wrist NR ?3.1 NR ?5 Dig V - Wrist 26               F  Wave    Nerve F Lat Ref.   ms ms  R Tibial - AH 85.9 ?56.0  R Ulnar - ADM 34.7 ?32.0         EMG Summary Table    Spontaneous MUAP Recruitment  Muscle IA Fib PSW Fasc Other Amp Dur. Poly Pattern  R. Tibialis posterior Normal None None None _______ Normal Normal Normal Reduced  R. Tibialis anterior Normal None None None _______ Increased Increased Normal Reduced  R. Peroneus longus Normal None None None _______ Increased Increased Normal Reduced  R. Vastus lateralis Normal None None None _______ Increased Increased 1+ Reduced  R. Gastrocnemius (Medial head) Normal None None None _______ Increased Increased 1+ Reduced  L. Tibialis anterior Normal None None None _______  Increased Increased 1+ Reduced  L. Tibialis posterior Normal None None None _______ Increased Increased 1+ Reduced  L. Peroneus longus Normal None None None _______ Increased Increased 1+ Reduced  L. Vastus lateralis Normal None None None _______ Increased Normal Normal Reduced  L. Gastrocnemius (Medial head) Normal None None None _______ Increased Increased Normal Reduced  R. Lumbar paraspinals (low) Increased None None None _______ Normal Normal Normal Normal  R. Lumbar paraspinals (mid) Increased None None None _______ Normal Normal Normal Normal  L. Lumbar paraspinals (low) Increased None None None _______ Normal Normal Normal Normal  L. Lumbar paraspinals (mid) Increased None None None _______ Normal Normal Normal Normal  R. First dorsal interosseous Normal None None None _______ Normal Normal Normal Reduced  R. Pronator teres Normal None None None _______ Normal Normal Normal Reduced  R. Brachioradialis Normal None None None _______ Normal Normal Normal Reduced  R. Biceps brachii Normal None None None _______ Normal Normal Normal Reduced  R. Triceps brachii Normal None None None _______ Normal Normal Normal Normal  R. Cervical paraspinals Normal None None None _______ Normal Normal Normal Normal

## 2020-03-09 LAB — FERRITIN: Ferritin: 41 ng/mL (ref 30–400)

## 2020-03-09 LAB — HEPATITIS PANEL, ACUTE
Hep A IgM: NEGATIVE
Hep B C IgM: NEGATIVE
Hep C Virus Ab: <0.1 s/co ratio (ref 0.0–0.9)
Hepatitis B Surface Ag: NEGATIVE

## 2020-03-09 LAB — MULTIPLE MYELOMA PANEL, SERUM
Albumin SerPl Elph-Mcnc: 3.3 g/dL (ref 2.9–4.4)
Albumin/Glob SerPl: 1.2 (ref 0.7–1.7)
Alpha 1: 0.3 g/dL (ref 0.0–0.4)
Alpha2 Glob SerPl Elph-Mcnc: 0.9 g/dL (ref 0.4–1.0)
B-Globulin SerPl Elph-Mcnc: 1.2 g/dL (ref 0.7–1.3)
Gamma Glob SerPl Elph-Mcnc: 0.6 g/dL (ref 0.4–1.8)
Globulin, Total: 3 g/dL (ref 2.2–3.9)
IgA/Immunoglobulin A, Serum: 328 mg/dL (ref 61–437)
IgG (Immunoglobin G), Serum: 692 mg/dL (ref 603–1613)
IgM (Immunoglobulin M), Srm: 34 mg/dL (ref 15–143)
Total Protein: 6.3 g/dL (ref 6.0–8.5)

## 2020-03-09 LAB — ANA W/REFLEX IF POSITIVE: Anti Nuclear Antibody (ANA): NEGATIVE

## 2020-03-09 LAB — COPPER, SERUM: Copper: 92 ug/dL (ref 69–132)

## 2020-03-10 ENCOUNTER — Telehealth: Payer: Self-pay | Admitting: *Deleted

## 2020-03-10 NOTE — Telephone Encounter (Signed)
-----   Message from Levert Feinstein, MD sent at 03/09/2020  5:31 PM EDT ----- Please call patient for normal laboratory result

## 2020-03-10 NOTE — Telephone Encounter (Signed)
I spoke to the patient and he verbalized understanding of his results. 

## 2020-03-12 ENCOUNTER — Other Ambulatory Visit: Payer: Self-pay

## 2020-03-12 ENCOUNTER — Ambulatory Visit
Admission: RE | Admit: 2020-03-12 | Discharge: 2020-03-12 | Disposition: A | Discharge status: Home / Self Care | Payer: Medicare Other | Source: Ambulatory Visit | Attending: Neurology | Admitting: Neurology

## 2020-03-12 ENCOUNTER — Telehealth: Payer: Self-pay | Admitting: Neurology

## 2020-03-12 VITALS — BP 182/98 | HR 76

## 2020-03-12 DIAGNOSIS — G6181 Chronic inflammatory demyelinating polyneuritis: Secondary | ICD-10-CM

## 2020-03-12 DIAGNOSIS — E230 Hypopituitarism: Secondary | ICD-10-CM

## 2020-03-12 NOTE — Telephone Encounter (Signed)
I spoke to the patient and his daughter. They both verbalized understanding of the findings. He is agreeable to start IVIG. Signed MD orders have been provided to [facility]. The patient is aware to expect a call for scheduling. He will be sure to answer unknown numbers.

## 2020-03-12 NOTE — Telephone Encounter (Signed)
Please call patient, lumbar puncture showed elevated total protein 64,  Along with his abnormal EMG nerve conductions, consistent with demyelinating polyradiculoneuropathy  We will arrange IVIG at [facility],  2 g/kg as loading dose (500 mg/kg x4 days)  Followed by maintenance dose of 1 g/kg (500 mg/kg x 2days) x5 times  Total of 6 treatment at initial order

## 2020-03-12 NOTE — Discharge Instructions (Signed)

## 2020-03-22 ENCOUNTER — Telehealth: Payer: Self-pay | Admitting: Neurology

## 2020-03-22 DIAGNOSIS — G6181 Chronic inflammatory demyelinating polyneuritis: Secondary | ICD-10-CM

## 2020-03-22 DIAGNOSIS — E230 Hypopituitarism: Secondary | ICD-10-CM

## 2020-03-22 NOTE — Telephone Encounter (Signed)
I have called and left message at [phone] for him to call back for treatment plan.  Per [facility] nurse, patient is concerned about high co-pay with IVIG treatment,

## 2020-03-25 MED ORDER — PREDNISONE 10 MG PO TABS
ORAL_TABLET | ORAL | 3 refills | Status: AC
Start: 2020-03-25 — End: ?

## 2020-03-25 NOTE — Addendum Note (Signed)
Addended by: Levert Feinstein on: 03/25/2020 01:58 PM   Modules accepted: Orders

## 2020-03-25 NOTE — Telephone Encounter (Signed)
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

## 2020-04-01 ENCOUNTER — Encounter
Admit: 2020-04-01 | Discharge: 2020-04-02 | Payer: MEDICARE | Attending: Cardiovascular Disease | Primary: Cardiovascular Disease

## 2020-04-09 LAB — CSF CELL COUNT WITH DIFFERENTIAL
RBC Count, CSF: 59 cells/uL — ABNORMAL HIGH
WBC, CSF: 1 cells/uL (ref 0–5)

## 2020-04-09 LAB — PROTEIN, CSF: Total Protein, CSF: 64 mg/dL — ABNORMAL HIGH (ref 15–60)

## 2020-04-09 LAB — FUNGUS CULTURE W SMEAR
CULTURE:: NO GROWTH
MICRO NUMBER:: 10742342
SMEAR:: NONE SEEN
SPECIMEN QUALITY:: ADEQUATE

## 2020-04-09 LAB — GRAM STAIN
GRAM STAIN:: NONE SEEN
MICRO NUMBER:: 10742341
SPECIMEN QUALITY:: ADEQUATE

## 2020-04-09 LAB — GLUCOSE, CSF: Glucose, CSF: 68 mg/dL (ref 40–80)

## 2020-04-09 LAB — VDRL, CSF: VDRL Quant, CSF: NONREACTIVE

## 2020-05-08 ENCOUNTER — Emergency Department (HOSPITAL_COMMUNITY)
Admission: EM | Admit: 2020-05-08 | Discharge: 2020-05-08 | Disposition: A | Discharge status: Home / Self Care | Payer: Medicare Other | Attending: Emergency Medicine | Admitting: Emergency Medicine

## 2020-05-08 ENCOUNTER — Encounter (HOSPITAL_COMMUNITY): Payer: Self-pay | Admitting: Emergency Medicine

## 2020-05-08 ENCOUNTER — Other Ambulatory Visit: Payer: Self-pay

## 2020-05-08 ENCOUNTER — Emergency Department (HOSPITAL_COMMUNITY): Payer: Medicare Other

## 2020-05-08 DIAGNOSIS — F17210 Nicotine dependence, cigarettes, uncomplicated: Secondary | ICD-10-CM | POA: Diagnosis not present

## 2020-05-08 DIAGNOSIS — R0602 Shortness of breath: Secondary | ICD-10-CM | POA: Insufficient documentation

## 2020-05-08 DIAGNOSIS — Z7982 Long term (current) use of aspirin: Secondary | ICD-10-CM | POA: Insufficient documentation

## 2020-05-08 DIAGNOSIS — F410 Panic disorder [episodic paroxysmal anxiety] without agoraphobia: Secondary | ICD-10-CM | POA: Insufficient documentation

## 2020-05-08 DIAGNOSIS — F419 Anxiety disorder, unspecified: Secondary | ICD-10-CM | POA: Diagnosis not present

## 2020-05-08 DIAGNOSIS — I119 Hypertensive heart disease without heart failure: Secondary | ICD-10-CM | POA: Diagnosis not present

## 2020-05-08 DIAGNOSIS — Z79899 Other long term (current) drug therapy: Secondary | ICD-10-CM | POA: Diagnosis not present

## 2020-05-08 DIAGNOSIS — R7989 Other specified abnormal findings of blood chemistry: Secondary | ICD-10-CM | POA: Diagnosis not present

## 2020-05-08 LAB — BASIC METABOLIC PANEL
Anion gap: 12 (ref 5–15)
BUN: 36 mg/dL — ABNORMAL HIGH (ref 8–23)
CO2: 22 mmol/L (ref 22–32)
Calcium: 8.7 mg/dL — ABNORMAL LOW (ref 8.9–10.3)
Chloride: 104 mmol/L (ref 98–111)
Creatinine, Ser: 2.05 mg/dL — ABNORMAL HIGH (ref 0.61–1.24)
GFR calc Af Amer: 36 mL/min — ABNORMAL LOW (ref >60)
GFR calc non Af Amer: 31 mL/min — ABNORMAL LOW (ref >60)
Glucose, Bld: 94 mg/dL (ref 70–99)
Potassium: 5 mmol/L (ref 3.5–5.1)
Sodium: 138 mmol/L (ref 135–145)

## 2020-05-08 LAB — BRAIN NATRIURETIC PEPTIDE: B Natriuretic Peptide: 106 pg/mL — ABNORMAL HIGH (ref 0.0–100.0)

## 2020-05-08 LAB — CBC
HCT: 46.1 % (ref 39.0–52.0)
Hemoglobin: 13.1 g/dL (ref 13.0–17.0)
MCH: 20.8 pg — ABNORMAL LOW (ref 26.0–34.0)
MCHC: 28.4 g/dL — ABNORMAL LOW (ref 30.0–36.0)
MCV: 73.3 fL — ABNORMAL LOW (ref 80.0–100.0)
Platelets: 208 10*3/uL (ref 150–400)
RBC: 6.29 MIL/uL — ABNORMAL HIGH (ref 4.22–5.81)
RDW: 21.1 % — ABNORMAL HIGH (ref 11.5–15.5)
WBC: 12.4 10*3/uL — ABNORMAL HIGH (ref 4.0–10.5)
nRBC: 1.2 % — ABNORMAL HIGH (ref 0.0–0.2)

## 2020-05-08 LAB — TROPONIN I (HIGH SENSITIVITY)
Troponin I (High Sensitivity): 24 ng/L — ABNORMAL HIGH (ref <18)
Troponin I (High Sensitivity): 21 ng/L — ABNORMAL HIGH (ref <18)

## 2020-05-08 MED ORDER — LORAZEPAM 2 MG/ML IJ SOLN
1.0000 mg | Freq: Once | INTRAMUSCULAR | Status: AC
  Administered 2020-05-08: 1 mg via INTRAVENOUS
  Filled 2020-05-08: qty 1

## 2020-05-08 MED ORDER — LORAZEPAM 1 MG PO TABS: 1.0000 mg | ORAL_TABLET | Freq: Once | ORAL | Status: DC

## 2020-05-08 NOTE — ED Notes (Signed)
Pt in hallway bed per charge RN. No cardiac monitor available at this time. EDP notified and aware that we will move pt to room with cardiac monitor when one becomes available.

## 2020-05-08 NOTE — ED Triage Notes (Signed)
Patient c/o "anxiety attack" that started this morning at 8. Per patient had x2 yesterday. Per patient has had "panic attacks before but not like this." Patient states started after taking prednisone for "swelling" in legs. Per patient jittery with numbness in feet and shortness of breath.

## 2020-05-08 NOTE — Discharge Instructions (Addendum)
At this time there does not appear to be the presence of an emergent medical condition, however there is always the potential for conditions to change. Please read and follow the below instructions.  Please return to the Emergency Department immediately for any new or worsening symptoms or if your symptoms do not improve within 2 days. Please be sure to follow up with your Primary Care Provider within one week regarding your visit today; please call their office to schedule an appointment even if you are feeling better for a follow-up visit. Recommend that you stop taking the increased dose of prednisone and return to your normal 10 mg of prednisone daily to help with your anxiety symptoms.  Please see your primary provider this week for a recheck. Your kidney function was elevated today, please have your kidney function and electrolytes rechecked by your primary care provider at your follow-up visit to ensure improvement.  Please increase her water intake to avoid dehydration.  Call 911 and go to the nearest emergency department immediately if you: A racing heart and shortness of breath. Have chest pain or trouble breathing Have nausea vomiting or abdominal pain Thoughts of hurting yourself or others. Have any new/concerning or worsening of symptoms. If you ever feel like you may hurt yourself or others, or have thoughts about taking your own life, get help right away. You can go to your nearest emergency department or call: Your local emergency services (911 in the [place] A suicide crisis helpline, such as the National Suicide Prevention Lifeline at (603)141-1622. This is open 24 hours a day.  Please read the additional information packets attached to your discharge summary.  Do not take your medicine if  develop an itchy rash, swelling in your mouth or lips, or difficulty breathing; call 911 and seek immediate emergency medical attention if this occurs.  You may review your lab tests and  imaging results in their entirety on your MyChart account.  Please discuss all results of fully with your primary care provider and other specialist at your follow-up visit.  Note: Portions of this text may have been transcribed using voice recognition software. Every effort was made to ensure accuracy; however, inadvertent computerized transcription errors may still be present.

## 2020-05-08 NOTE — ED Provider Notes (Addendum)
Medical screening examination/treatment/procedure(s) were conducted as a shared visit with non-physician practitioner(s) and myself.  I personally evaluated the patient during the encounter.      Patient seen by me along with the physician assistant.  Patient most likely having symptoms related to the increased amount of prednisone that has been taking over the last several days.  Patient normally is on 10 mg of prednisone daily.  But patient with like panic attack type symptoms.  Delta troponin has been ordered patient will wait for that.  If that is not significantly changed from his original troponin at 24 then patient can be discharged home.  Would recommend that he drop back down to the 10 mg of prednisone daily.  Patient requested antianxiety medicines.  But feel that is probably not wise.  Patient is follow-up with his primary care doctor on Monday.   Vanetta Mulders, MD 05/08/20 1814  Addendum Patient's repeat troponin now was actually lower than the first 1 at 21.  New findings patient's BUN and creatinine is elevated compared to where he was.  Potassium is okay.  Patient is currently close follow-up.  Did not want to be admitted for this.  Asked measure had trouble getting him to stay in for the second troponin.  He does have follow-up with his primary care doctor on Monday.  They will need to follow the renal function closely.    Vanetta Mulders, MD 05/08/20 1919

## 2020-05-08 NOTE — ED Provider Notes (Signed)
[facility] EMERGENCY DEPARTMENT Provider Note   CSN: [id number] Arrival date & time: 05/08/20  1217     History Chief Complaint  Patient presents with  . Panic Attack    Mathew Rodriguez is a 74 y.o. male history of AAA, CAD/MI, hypertension, chronic inflammatory demyelinating polyneuropathy.  Patient presents today for increased anxiety over the past 2 days.  Patient reports that this anxiety attack started the day after he began taking an increased dose of prednisone.  Normally he takes 10 mg prednisone daily for neuropathy, he was started on 60 mg daily 3 days ago but has been tapering down ever since by 10 mg.  He reports that this is the only medication change or life event in the past few weeks that he can remember.  He reports feeling anxious like his heart is racing, he has been taking anxiety medication at home without improvement.  He reports when he feels anxious he feels a tightness in his chest which causes him to feel trouble breathing, this improves with anxiety medication and relaxing.  Denies fever/chills, fall/injury, chest pain, vomiting, cough/hemoptysis, hallucinations, suicidal ideations, homicidal ideations, toxic ingestions, self injuries or any additional concerns.  HPI     Past Medical History:  Diagnosis Date  . AAA (abdominal aortic aneurysm) (HCC)   . Coronary artery disease    16 stents placed  . Hypertension   . Hypopituitarism (HCC)   . Myocardial infarction (HCC)   . Pain in both feet   . Renal artery stenosis (HCC)    s/p stent  . Thyroid disease     Patient Active Problem List   Diagnosis Date Noted  . CIDP (chronic inflammatory demyelinating polyneuropathy) (HCC) 03/03/2020  . Abnormal thyroid blood test 02/02/2020  . Urinary urgency 01/29/2020  . Gait abnormality 01/29/2020  . Paresthesia 01/29/2020  . Acute renal failure (HCC) 02/19/2015  . Viral gastroenteritis 02/19/2015  . Dehydration 02/19/2015  . ARF (acute renal failure)  (HCC) 02/19/2015  . Hypokalemia 07/01/2012  . 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 DYSLIPIDEMIA 02/02/2009  . POLYCYTHEMIA 02/02/2009  . Essential hypertension, benign 02/02/2009  . MYOCARDIAL INFARCTION, INFERIOR WALL 02/02/2009  . CORONARY ATHEROSCLEROSIS NATIVE CORONARY ARTERY 02/02/2009  . BRADYCARDIA 02/02/2009  . GERD 02/02/2009    Past Surgical History:  Procedure Laterality Date  . cardiac stents    . stent in kidney         Family History  Problem Relation Age of Onset  . Other Mother        "old age"  . Heart attack Father   . Lung cancer Sister   . Cancer Brother        unsure of type  . Cancer Sister        unsure of type  . Cancer Sister        unsure of type    Social History   Tobacco Use  . Smoking status: Current Every Day Smoker    Packs/day: 1.00    Types: Cigarettes  . Smokeless tobacco: Never Used  Substance Use Topics  . Alcohol use: Yes    Comment: liquor every few days  . Drug use: Never    Home Medications Prior to Admission medications   Medication Sig Start Date End Date Taking? Authorizing Provider  ALPRAZolam Prudy Feeler(XANAX) 0.5 MG tablet Take 0.5 mg by mouth 2 (two) times daily.    [provider]  amLODipine (NORVASC) 5 MG tablet Take 5 mg  by mouth daily.    [provider]  aspirin EC 81 MG tablet Take 81 mg by mouth daily.    [provider]  carvedilol (COREG) 6.25 MG tablet Take 6.25 mg by mouth 2 (two) times daily.    [provider]  citalopram (CELEXA) 20 MG tablet Take 20 mg by mouth daily.    [provider]  clopidogrel (PLAVIX) 75 MG tablet Take 75 mg by mouth daily.    [provider]  ezetimibe (ZETIA) 10 MG tablet Take 10 mg by mouth daily.    [provider]  finasteride (PROSCAR) 5 MG tablet Take 5 mg by mouth daily.    [provider]  fludrocortisone (FLORINEF) 0.1 MG tablet Take 0.2 mg by mouth 2 (two) times  daily.    [provider]  isosorbide mononitrate (IMDUR) 30 MG 24 hr tablet Take 30 mg by mouth daily.    [provider]  levothyroxine (SYNTHROID, LEVOTHROID) 100 MCG tablet Take 100 mcg by mouth daily before breakfast.    [provider]  lisinopril (ZESTRIL) 20 MG tablet Take 20 mg by mouth daily.    [provider]  potassium chloride SA (KLOR-CON) 20 MEQ tablet Take 20 mEq by mouth daily. 10/23/19   [provider]  predniSONE (DELTASONE) 10 MG tablet 6 tablets every morning for 2 weeks, 5 tablets every morning for 2 weeks 4 tablets every morning for 2 weeks,  Stay at 3 tablets every morning 03/25/20   Levert Feinstein, MD  rosuvastatin (CRESTOR) 5 MG tablet Take by mouth. 04/10/18   [provider]  Tamsulosin HCl (FLOMAX) 0.4 MG CAPS Take 0.4 mg by mouth daily.    [provider]  testosterone cypionate (DEPOTESTOSTERONE CYPIONATE) 200 MG/ML injection Inject 200 mg into the muscle every 14 (fourteen) days. 10/23/19   [provider]    Allergies    Other, Statins, and Doxycycline  Review of Systems   Review of Systems Ten systems are reviewed and are negative for acute change except as noted in the HPI  Physical Exam Updated Vital Signs BP 99/67 (BP Location: Right Arm)   Pulse 95   Temp 98.5 F (36.9 C) (Oral)   Resp (!) 22   Ht 6' (1.829 m)   Wt 95.7 kg   SpO2 99%   BMI 28.62 kg/m   Physical Exam Constitutional:      General: He is not in acute distress.    Appearance: Normal appearance. He is well-developed. He is not ill-appearing or diaphoretic.  HENT:     Head: Normocephalic and atraumatic.     Right Ear: External ear normal.     Left Ear: External ear normal.  Eyes:     General: Vision grossly intact. Gaze aligned appropriately.     Pupils: Pupils are equal, round, and reactive to light.  Neck:     Trachea: Trachea and phonation normal.  Cardiovascular:     Rate and Rhythm: Normal rate and  regular rhythm.     Pulses: Normal pulses.  Pulmonary:     Effort: Pulmonary effort is normal. No respiratory distress.     Breath sounds: Normal breath sounds.  Abdominal:     General: There is no distension.     Palpations: Abdomen is soft.     Tenderness: There is no abdominal tenderness. There is no guarding or rebound.  Musculoskeletal:        General: Normal range of motion.  Cervical back: Normal range of motion.  Skin:    General: Skin is warm and dry.  Neurological:     Mental Status: He is alert.     GCS: GCS eye subscore is 4. GCS verbal subscore is 5. GCS motor subscore is 6.     Comments: Speech is clear and goal oriented, follows commands Major Cranial nerves without deficit, no facial droop Moves extremities without ataxia, coordination intact Normal gait  Psychiatric:        Attention and Perception: He does not perceive auditory or visual hallucinations.        Speech: Speech normal.        Behavior: Behavior normal. Behavior is cooperative.        Thought Content: Thought content normal. Thought content does not include homicidal or suicidal ideation.    ED Results / Procedures / Treatments   Labs (all labs ordered are listed, but only abnormal results are displayed) Labs Reviewed  BASIC METABOLIC PANEL - Abnormal; Notable for the following components:      Result Value   BUN 36 (*)    Creatinine, Ser 2.05 (*)    Calcium 8.7 (*)    GFR calc non Af Amer 31 (*)    GFR calc Af Amer 36 (*)    All other components within normal limits  CBC - Abnormal; Notable for the following components:   WBC 12.4 (*)    RBC 6.29 (*)    MCV 73.3 (*)    MCH 20.8 (*)    MCHC 28.4 (*)    RDW 21.1 (*)    nRBC 1.2 (*)    All other components within normal limits  BRAIN NATRIURETIC PEPTIDE - Abnormal; Notable for the following components:   B Natriuretic Peptide 106.0 (*)    All other components within normal limits  TROPONIN I (HIGH SENSITIVITY) - Abnormal; Notable for  the following components:   Troponin I (High Sensitivity) 24 (*)    All other components within normal limits  TROPONIN I (HIGH SENSITIVITY) - Abnormal; Notable for the following components:   Troponin I (High Sensitivity) 21 (*)    All other components within normal limits    EKG EKG Interpretation  Date/Time:  Saturday May 08 2020 12:35:29 EDT Ventricular Rate:  98 PR Interval:  134 QRS Duration: 80 QT Interval:  372 QTC Calculation: 474 R Axis:   14 Text Interpretation: Normal sinus rhythm Nonspecific ST abnormality Abnormal ECG Confirmed by Vanetta Mulders [phone]) on 05/08/2020 7:10:46 PM   Radiology DG Chest Port 1 View  Result Date: 05/08/2020 CLINICAL DATA:  Shortness of breath, anxiety EXAM: PORTABLE CHEST 1 VIEW COMPARISON:  03/22/2009 FINDINGS: The heart size and mediastinal contours are within normal limits. Both lungs are clear. The visualized skeletal structures are unremarkable. IMPRESSION: No acute abnormality of the lungs in AP portable projection. Electronically Signed   By: Lauralyn Primes M.D.   On: 05/08/2020 16:57    Procedures Procedures (including critical care time)  Medications Ordered in ED Medications  LORazepam (ATIVAN) injection 1 mg (1 mg Intravenous Given by Other 05/08/20 1625)  LORazepam (ATIVAN) injection 1 mg (1 mg Intravenous Given 05/08/20 1748)    ED Course  I have reviewed the triage vital signs and the nursing notes.  Pertinent labs & imaging results that were available during my care of the patient were reviewed by me and considered in my medical decision making (see chart for details).    MDM Rules/Calculators/A&P  Additional history obtained from: 1. Nursing notes from this visit. 2. Electronic medical record. --------------------------- I ordered, reviewed and interpreted labs which include: CBC shows leukocytosis 12.4, suspect this is secondary to steroid use, no infectious symptoms.  No  anemia. BMP shows mild elevation of creatinine from prior at 2.05.  Potassium within normal limits.  Patient encouraged to have kidney function and electrolytes rechecked by PCP this week and to increase his water intake. Initial had sent me troponin of 24, delta 21.  Patient without chest pain or difficulty breathing, doubt ACS, he is to follow-up with PCP and cardiology. BNP of 106 minimally elevated, no prior to compare, patient does not appear to be in CHF exacerbation. Chest x-ray:  IMPRESSION:  No acute abnormality of the lungs in AP portable projection.   Patient reassessed multiple times during this visit he is calm and cooperative walking in the emergency department requesting to go outside to smoke a cigarette.  Suspect patient symptoms secondary to medication reaction from increased prednisone.  Patient advised to decrease prednisone back to his maintenance dose of 10 mg a day and to speak with his primary care provider this week about medication management and for a recheck.  Patient was advised of all above findings and to follow-up with his PCP for repeat blood work and rechecks.  Patient encouraged to drink water to avoid dehydration and get plenty of rest.  Given patient's age and that he already has a Xanax prescription additional benzodiazepine treatment would be inappropriate.  Suspect his symptoms will improve with decreasing prednisone.  Patient has no SI/HI, hallucinations or evidence of an emergent psychiatric emergency.  He is appropriate and cooperative.  Initially vital signs within normal limits, reassuring work-up above no evidence of ACS, PE, dissection or other emergent medical pathologies.  At this time there does not appear to be any evidence of an acute emergency medical condition and the patient appears stable for discharge with appropriate outpatient follow up. Diagnosis was discussed with patient who verbalizes understanding of care plan and is agreeable to discharge. I  have discussed return precautions with patient who verbalizes understanding. Patient encouraged to follow-up with their PCP. All questions answered.  Patient was seen and evaluated by Dr. Deretha Emory during this visit who agrees with decreasing prednisone and PCP follow-up.  Note: Portions of this report may have been transcribed using voice recognition software. Every effort was made to ensure accuracy; however, inadvertent computerized transcription errors may still be present. Final Clinical Impression(s) / ED Diagnoses Final diagnoses:  Anxiety  Elevated serum creatinine    Rx / DC Orders ED Discharge Orders    None       Elizabeth Palau 05/08/20 1915    Vanetta Mulders, MD 05/28/20 1627

## 2020-05-10 ENCOUNTER — Ambulatory Visit
Admit: 2020-05-10 | Discharge: 2020-05-11 | Disposition: A | Discharge status: Home / Self Care | Payer: MEDICARE | Admitting: Student in an Organized Health Care Education/Training Program

## 2020-05-11 MED ORDER — XARELTO DVT-PE TREATMENT 30-DAY STARTER 15 MG(42)-20 MG(9) TABLET PACK
ORAL_TABLET | 0 refills | 0 days
Start: 2020-05-11 — End: 2020-05-11

## 2020-05-11 MED ORDER — ELIQUIS 5 MG TABLET
ORAL_TABLET | Freq: Two times a day (BID) | ORAL | 0 refills | 30.00000 days
Start: 2020-05-11 — End: 2020-05-11

## 2020-05-11 MED ORDER — APIXABAN 5 MG TABLET
ORAL_TABLET | 0 refills | 0 days | Status: CP
Start: 2020-05-11 — End: ?

## 2020-05-17 ENCOUNTER — Encounter: Payer: Self-pay | Admitting: Neurology

## 2020-05-17 ENCOUNTER — Ambulatory Visit: Payer: Self-pay | Admitting: Neurology

## 2020-05-17 ENCOUNTER — Telehealth: Payer: Self-pay | Admitting: *Deleted

## 2020-05-17 NOTE — Telephone Encounter (Signed)
No showed follow up appointment. 

## 2020-05-27 ENCOUNTER — Encounter: Admit: 2020-05-27 | Discharge: 2020-06-01 | Payer: MEDICARE

## 2020-05-27 ENCOUNTER — Ambulatory Visit: Admit: 2020-05-27 | Discharge: 2020-06-01 | Payer: MEDICARE

## 2020-05-28 DIAGNOSIS — I252 Old myocardial infarction: Principal | ICD-10-CM

## 2020-06-01 MED ORDER — NICOTINE 21 MG/24 HR DAILY TRANSDERMAL PATCH
MEDICATED_PATCH | Freq: Every day | TRANSDERMAL | 0 refills | 30 days | Status: CP
Start: 2020-06-01 — End: 2020-07-01

## 2020-06-01 MED ORDER — FERROUS SULFATE 325 MG (65 MG IRON) TABLET
ORAL_TABLET | ORAL | 11 refills | 30 days | Status: CP
Start: 2020-06-01 — End: 2021-06-01

## 2020-06-01 MED ORDER — MELATONIN 3 MG TABLET
ORAL_TABLET | Freq: Every evening | ORAL | 0 refills | 30 days | Status: CP | PRN
Start: 2020-06-01 — End: ?

## 2020-06-01 MED ORDER — NICOTINE (POLACRILEX) 4 MG BUCCAL LOZENGE
BUCCAL | 0 refills | 5 days | Status: CP | PRN
Start: 2020-06-01 — End: 2020-07-01

## 2020-06-01 MED ORDER — ALUMINUM-MAG HYDROXIDE-SIMETHICONE 400 MG-400 MG-40 MG/5 ML ORAL SUSP
ORAL | 0 refills | 2.00000 days | PRN
Start: 2020-06-01 — End: 2020-07-01

## 2020-06-01 MED ORDER — PANTOPRAZOLE 40 MG TABLET,DELAYED RELEASE
ORAL_TABLET | Freq: Every day | ORAL | 0 refills | 30.00000 days | Status: CP
Start: 2020-06-01 — End: 2020-07-01

## 2020-06-01 MED ORDER — LORAZEPAM 1 MG TABLET
ORAL_TABLET | 0 refills | 0 days | Status: CP
Start: 2020-06-01 — End: ?

## 2020-06-01 MED ORDER — CALCIUM CARBONATE 200 MG CALCIUM (500 MG) CHEWABLE TABLET
ORAL_TABLET | Freq: Three times a day (TID) | ORAL | 0 refills | 30.00000 days | Status: CP | PRN
Start: 2020-06-01 — End: 2020-07-01

## 2020-06-07 MED ORDER — APIXABAN 5 MG TABLET
ORAL_TABLET | Freq: Two times a day (BID) | ORAL | 1 refills | 30 days | Status: CP
Start: 2020-06-07 — End: ?

## 2020-06-14 ENCOUNTER — Encounter: Admit: 2020-06-14 | Discharge: 2020-06-16 | Payer: MEDICARE

## 2020-06-16 MED ORDER — MELATONIN 3 MG TABLET
ORAL_TABLET | Freq: Every evening | ORAL | 0 refills | 30.00000 days | Status: CP
Start: 2020-06-16 — End: 2020-06-16

## 2020-06-16 MED ORDER — MIRTAZAPINE 7.5 MG TABLET
ORAL_TABLET | Freq: Every evening | ORAL | 0 refills | 30.00000 days | Status: CP
Start: 2020-06-16 — End: 2020-06-16

## 2020-06-16 MED ORDER — ALLOPURINOL 100 MG TABLET
ORAL_TABLET | Freq: Every day | ORAL | 0 refills | 30.00000 days | Status: CP
Start: 2020-06-16 — End: 2020-06-16

## 2020-06-16 MED ORDER — MIRTAZAPINE 7.5 MG TABLET: 8 mg | tablet | Freq: Every evening | 0 refills | 30 days | Status: AC

## 2020-06-16 MED ORDER — MELATONIN 3 MG TABLET: 6 mg | tablet | Freq: Every evening | 0 refills | 30 days | Status: AC

## 2020-06-16 MED ORDER — ALLOPURINOL 100 MG TABLET: 50 mg | tablet | Freq: Every day | 0 refills | 30 days | Status: AC

## 2020-06-21 ENCOUNTER — Encounter: Admit: 2020-06-21 | Discharge: 2020-06-21 | Discharge status: Against Medical Advice | Payer: MEDICARE

## 2020-06-21 DIAGNOSIS — Z5321 Procedure and treatment not carried out due to patient leaving prior to being seen by health care provider: Principal | ICD-10-CM

## 2020-06-23 ENCOUNTER — Encounter
Admit: 2020-06-23 | Discharge: 2020-06-24 | Disposition: A | Discharge status: Home / Self Care | Payer: MEDICARE | Attending: Emergency Medicine

## 2020-06-23 DIAGNOSIS — Z9861 Coronary angioplasty status: Principal | ICD-10-CM

## 2020-06-23 DIAGNOSIS — R338 Other retention of urine: Principal | ICD-10-CM

## 2020-06-23 DIAGNOSIS — F419 Anxiety disorder, unspecified: Principal | ICD-10-CM

## 2020-06-23 DIAGNOSIS — R300 Dysuria: Principal | ICD-10-CM

## 2020-06-23 DIAGNOSIS — N183 Chronic kidney disease, stage 3 unspecified: Principal | ICD-10-CM

## 2020-06-23 DIAGNOSIS — E785 Hyperlipidemia, unspecified: Principal | ICD-10-CM

## 2020-06-23 DIAGNOSIS — I252 Old myocardial infarction: Principal | ICD-10-CM

## 2020-06-23 DIAGNOSIS — K219 Gastro-esophageal reflux disease without esophagitis: Principal | ICD-10-CM

## 2020-06-23 DIAGNOSIS — I129 Hypertensive chronic kidney disease with stage 1 through stage 4 chronic kidney disease, or unspecified chronic kidney disease: Principal | ICD-10-CM

## 2020-06-23 DIAGNOSIS — Z79899 Other long term (current) drug therapy: Principal | ICD-10-CM

## 2020-06-23 DIAGNOSIS — E230 Hypopituitarism: Principal | ICD-10-CM

## 2020-06-23 DIAGNOSIS — D751 Secondary polycythemia: Principal | ICD-10-CM

## 2020-06-23 DIAGNOSIS — N401 Enlarged prostate with lower urinary tract symptoms: Principal | ICD-10-CM

## 2020-06-23 DIAGNOSIS — Z87891 Personal history of nicotine dependence: Principal | ICD-10-CM

## 2020-06-23 DIAGNOSIS — I2510 Atherosclerotic heart disease of native coronary artery without angina pectoris: Principal | ICD-10-CM

## 2020-06-23 DIAGNOSIS — E7800 Pure hypercholesterolemia, unspecified: Principal | ICD-10-CM

## 2020-06-30 ENCOUNTER — Ambulatory Visit
Admit: 2020-06-30 | Discharge: 2020-07-04 | Disposition: A | Discharge status: Home / Self Care | Payer: MEDICARE | Admitting: Internal Medicine

## 2020-06-30 DIAGNOSIS — E785 Hyperlipidemia, unspecified: Principal | ICD-10-CM

## 2020-06-30 DIAGNOSIS — Z66 Do not resuscitate: Principal | ICD-10-CM

## 2020-06-30 DIAGNOSIS — E43 Unspecified severe protein-calorie malnutrition: Principal | ICD-10-CM

## 2020-06-30 DIAGNOSIS — F17210 Nicotine dependence, cigarettes, uncomplicated: Principal | ICD-10-CM

## 2020-06-30 DIAGNOSIS — I2510 Atherosclerotic heart disease of native coronary artery without angina pectoris: Principal | ICD-10-CM

## 2020-06-30 DIAGNOSIS — M109 Gout, unspecified: Principal | ICD-10-CM

## 2020-06-30 DIAGNOSIS — Z79890 Hormone replacement therapy (postmenopausal): Principal | ICD-10-CM

## 2020-06-30 DIAGNOSIS — R338 Other retention of urine: Principal | ICD-10-CM

## 2020-06-30 DIAGNOSIS — E230 Hypopituitarism: Principal | ICD-10-CM

## 2020-06-30 DIAGNOSIS — Z86718 Personal history of other venous thrombosis and embolism: Principal | ICD-10-CM

## 2020-06-30 DIAGNOSIS — K219 Gastro-esophageal reflux disease without esophagitis: Principal | ICD-10-CM

## 2020-06-30 DIAGNOSIS — Z955 Presence of coronary angioplasty implant and graft: Principal | ICD-10-CM

## 2020-06-30 DIAGNOSIS — Z7901 Long term (current) use of anticoagulants: Principal | ICD-10-CM

## 2020-06-30 DIAGNOSIS — A419 Sepsis, unspecified organism: Principal | ICD-10-CM

## 2020-06-30 DIAGNOSIS — R778 Other specified abnormalities of plasma proteins: Principal | ICD-10-CM

## 2020-06-30 DIAGNOSIS — N183 Chronic kidney disease, stage 3 unspecified: Principal | ICD-10-CM

## 2020-06-30 DIAGNOSIS — N10 Acute tubulo-interstitial nephritis: Principal | ICD-10-CM

## 2020-06-30 DIAGNOSIS — E7800 Pure hypercholesterolemia, unspecified: Principal | ICD-10-CM

## 2020-06-30 DIAGNOSIS — E559 Vitamin D deficiency, unspecified: Principal | ICD-10-CM

## 2020-06-30 DIAGNOSIS — E038 Other specified hypothyroidism: Principal | ICD-10-CM

## 2020-06-30 DIAGNOSIS — Z79899 Other long term (current) drug therapy: Principal | ICD-10-CM

## 2020-06-30 DIAGNOSIS — F419 Anxiety disorder, unspecified: Principal | ICD-10-CM

## 2020-06-30 DIAGNOSIS — D631 Anemia in chronic kidney disease: Principal | ICD-10-CM

## 2020-06-30 DIAGNOSIS — B965 Pseudomonas (aeruginosa) (mallei) (pseudomallei) as the cause of diseases classified elsewhere: Principal | ICD-10-CM

## 2020-06-30 DIAGNOSIS — E872 Acidosis: Principal | ICD-10-CM

## 2020-06-30 DIAGNOSIS — E876 Hypokalemia: Principal | ICD-10-CM

## 2020-06-30 DIAGNOSIS — Z20822 Contact with and (suspected) exposure to covid-19: Principal | ICD-10-CM

## 2020-06-30 DIAGNOSIS — I129 Hypertensive chronic kidney disease with stage 1 through stage 4 chronic kidney disease, or unspecified chronic kidney disease: Principal | ICD-10-CM

## 2020-06-30 DIAGNOSIS — I252 Old myocardial infarction: Principal | ICD-10-CM

## 2020-06-30 DIAGNOSIS — G6181 Chronic inflammatory demyelinating polyneuritis: Principal | ICD-10-CM

## 2020-06-30 DIAGNOSIS — N401 Enlarged prostate with lower urinary tract symptoms: Principal | ICD-10-CM

## 2020-06-30 DIAGNOSIS — N12 Tubulo-interstitial nephritis, not specified as acute or chronic: Principal | ICD-10-CM

## 2020-06-30 DIAGNOSIS — Z6827 Body mass index (BMI) 27.0-27.9, adult: Principal | ICD-10-CM

## 2020-07-04 MED ORDER — NICOTINE (POLACRILEX) 4 MG BUCCAL LOZENGE: 4 mg | each | 0 refills | 5 days | Status: AC

## 2020-07-04 MED ORDER — NICOTINE 21 MG/24 HR DAILY TRANSDERMAL PATCH: 1 | patch | Freq: Every day | 0 refills | 30 days | Status: AC

## 2020-07-04 MED ORDER — NICOTINE (POLACRILEX) 4 MG BUCCAL LOZENGE
BUCCAL | 0 refills | 6.00000 days | Status: CP | PRN
Start: 2020-07-04 — End: 2020-08-25
  Filled 2020-07-04: qty 144, 6d supply, fill #0

## 2020-07-04 MED ORDER — CIPROFLOXACIN 500 MG TABLET: 500 mg | tablet | Freq: Two times a day (BID) | 0 refills | 10 days | Status: AC

## 2020-07-04 MED ORDER — ALUMINUM-MAG HYDROXIDE-SIMETHICONE 400 MG-400 MG-40 MG/5 ML ORAL SUSP
ORAL | 0 refills | 2.00000 days | Status: CP | PRN
Start: 2020-07-04 — End: 2020-07-04

## 2020-07-04 MED ORDER — CALCIUM CARBONATE 200 MG CALCIUM (500 MG) CHEWABLE TABLET
ORAL_TABLET | Freq: Three times a day (TID) | ORAL | 0 refills | 30.00000 days | Status: CP | PRN
Start: 2020-07-04 — End: 2020-07-04
  Filled 2020-07-04: qty 150, 50d supply, fill #0

## 2020-07-04 MED ORDER — CALCIUM CARBONATE 200 MG CALCIUM (500 MG) CHEWABLE TABLET: 200 mg | tablet | Freq: Three times a day (TID) | 0 refills | 30 days | Status: AC

## 2020-07-04 MED ORDER — ALUMINUM-MAG HYDROXIDE-SIMETHICONE 400 MG-400 MG-40 MG/5 ML ORAL SUSP: 30 mL | mL | 0 refills | 2 days | Status: AC

## 2020-07-04 MED ORDER — AMLODIPINE 5 MG TABLET: 5 mg | tablet | Freq: Every day | 3 refills | 90 days | Status: AC

## 2020-07-04 MED ORDER — CARVEDILOL 6.25 MG TABLET: 6 mg | tablet | Freq: Two times a day (BID) | 0 refills | 30 days | Status: AC

## 2020-07-04 MED ORDER — PANTOPRAZOLE 40 MG TABLET,DELAYED RELEASE: 40 mg | tablet | Freq: Every day | 0 refills | 30 days | Status: AC

## 2020-07-04 MED ORDER — CIPROFLOXACIN 500 MG TABLET
ORAL_TABLET | Freq: Two times a day (BID) | ORAL | 0 refills | 10.00000 days | Status: CP
Start: 2020-07-04 — End: 2020-07-04
  Filled 2020-07-04: qty 20, 10d supply, fill #0

## 2020-07-04 MED ORDER — AMLODIPINE 5 MG TABLET
ORAL_TABLET | Freq: Every day | ORAL | 3 refills | 90.00000 days | Status: CP
Start: 2020-07-04 — End: 2021-07-04
  Filled 2020-07-04: qty 90, 90d supply, fill #0

## 2020-07-04 MED ORDER — CARVEDILOL 6.25 MG TABLET
ORAL_TABLET | Freq: Two times a day (BID) | ORAL | 0 refills | 30.00000 days | Status: CP
Start: 2020-07-04 — End: 2020-07-04
  Filled 2020-07-04: qty 60, 30d supply, fill #0

## 2020-07-04 MED ORDER — NICOTINE 21 MG/24 HR DAILY TRANSDERMAL PATCH
MEDICATED_PATCH | Freq: Every day | TRANSDERMAL | 0 refills | 30.00000 days | Status: CP
Start: 2020-07-04 — End: 2020-07-04
  Filled 2020-07-04: qty 28, 28d supply, fill #0

## 2020-07-04 MED ORDER — PANTOPRAZOLE 40 MG TABLET,DELAYED RELEASE
ORAL_TABLET | Freq: Every day | ORAL | 0 refills | 30.00000 days | Status: CP
Start: 2020-07-04 — End: 2020-08-03

## 2020-07-04 MED FILL — AMLODIPINE 5 MG TABLET: 90 days supply | Qty: 90 | Fill #0 | Status: AC

## 2020-07-04 MED FILL — NICOTINE (POLACRILEX) 4 MG BUCCAL LOZENGE: 6 days supply | Qty: 144 | Fill #0 | Status: AC

## 2020-07-04 MED FILL — CARVEDILOL 6.25 MG TABLET: 30 days supply | Qty: 60 | Fill #0 | Status: AC

## 2020-07-04 MED FILL — CIPROFLOXACIN 500 MG TABLET: 10 days supply | Qty: 20 | Fill #0 | Status: AC

## 2020-07-04 MED FILL — NICOTINE 21 MG/24 HR DAILY TRANSDERMAL PATCH: 28 days supply | Qty: 28 | Fill #0 | Status: AC

## 2020-07-04 MED FILL — ANTACID (CALCIUM CARBONATE) 200 MG CALCIUM (500 MG) CHEWABLE TABLET: 50 days supply | Qty: 150 | Fill #0 | Status: AC

## 2020-07-05 ENCOUNTER — Encounter: Admit: 2020-07-05 | Discharge: 2020-07-06 | Payer: MEDICARE | Attending: "Endocrinology | Primary: "Endocrinology

## 2020-07-05 DIAGNOSIS — E230 Hypopituitarism: Principal | ICD-10-CM

## 2020-07-27 ENCOUNTER — Ambulatory Visit: Admit: 2020-07-27 | Discharge: 2020-07-28 | Payer: MEDICARE

## 2020-07-27 DIAGNOSIS — N401 Enlarged prostate with lower urinary tract symptoms: Principal | ICD-10-CM

## 2020-07-27 DIAGNOSIS — R338 Other retention of urine: Principal | ICD-10-CM

## 2020-07-29 ENCOUNTER — Encounter
Admit: 2020-07-29 | Discharge: 2020-07-30 | Payer: MEDICARE | Attending: Cardiovascular Disease | Primary: Cardiovascular Disease

## 2020-07-29 DIAGNOSIS — I2510 Atherosclerotic heart disease of native coronary artery without angina pectoris: Principal | ICD-10-CM

## 2020-08-25 ENCOUNTER — Encounter: Admit: 2020-08-25 | Discharge: 2020-08-26 | Payer: MEDICARE | Attending: Hematology | Primary: Hematology

## 2020-08-25 DIAGNOSIS — I82402 Acute embolism and thrombosis of unspecified deep veins of left lower extremity: Principal | ICD-10-CM

## 2020-08-25 DIAGNOSIS — D509 Iron deficiency anemia, unspecified: Principal | ICD-10-CM

## 2020-08-25 MED ORDER — APIXABAN 5 MG TABLET
ORAL_TABLET | Freq: Two times a day (BID) | ORAL | 11 refills | 30 days | Status: CP
Start: 2020-08-25 — End: ?

## 2020-10-13 ENCOUNTER — Encounter
Admit: 2020-10-13 | Discharge: 2020-10-13 | Payer: MEDICARE | Attending: Student in an Organized Health Care Education/Training Program | Primary: Student in an Organized Health Care Education/Training Program

## 2020-10-13 ENCOUNTER — Encounter: Admit: 2020-10-13 | Discharge: 2020-10-13 | Payer: MEDICARE

## 2020-10-13 DIAGNOSIS — R200 Anesthesia of skin: Principal | ICD-10-CM

## 2020-11-04 ENCOUNTER — Encounter
Admit: 2020-11-04 | Discharge: 2020-11-05 | Payer: MEDICARE | Attending: Cardiovascular Disease | Primary: Cardiovascular Disease

## 2020-11-04 DIAGNOSIS — I2510 Atherosclerotic heart disease of native coronary artery without angina pectoris: Principal | ICD-10-CM

## 2020-11-04 DIAGNOSIS — I10 Essential (primary) hypertension: Principal | ICD-10-CM

## 2020-11-12 ENCOUNTER — Encounter
Admit: 2020-11-12 | Discharge: 2020-11-13 | Payer: MEDICARE | Attending: Student in an Organized Health Care Education/Training Program | Primary: Student in an Organized Health Care Education/Training Program

## 2020-11-12 DIAGNOSIS — G609 Hereditary and idiopathic neuropathy, unspecified: Principal | ICD-10-CM

## 2020-11-12 MED ORDER — GABAPENTIN 100 MG CAPSULE
ORAL_CAPSULE | Freq: Every evening | ORAL | 3 refills | 270.00000 days | Status: CP
Start: 2020-11-12 — End: 2021-11-12

## 2020-11-12 MED ORDER — CYANOCOBALAMIN (VIT B-12) 1,000 MCG TABLET
ORAL_TABLET | Freq: Every day | ORAL | 3 refills | 90.00000 days | Status: CP
Start: 2020-11-12 — End: 2021-11-12

## 2020-11-12 MED ORDER — MULTIVITAMIN WITH MINERALS TABLET
ORAL_TABLET | Freq: Every day | ORAL | 3 refills | 90.00000 days | Status: CP
Start: 2020-11-12 — End: 2021-11-12

## 2020-11-22 ENCOUNTER — Encounter: Admit: 2020-11-22 | Discharge: 2020-11-23 | Payer: MEDICARE

## 2020-12-01 ENCOUNTER — Encounter: Admit: 2020-12-01 | Discharge: 2020-12-02 | Payer: MEDICARE | Attending: Hematology | Primary: Hematology

## 2020-12-01 DIAGNOSIS — D509 Iron deficiency anemia, unspecified: Principal | ICD-10-CM

## 2020-12-01 DIAGNOSIS — I82402 Acute embolism and thrombosis of unspecified deep veins of left lower extremity: Principal | ICD-10-CM

## 2020-12-01 MED ORDER — APIXABAN 2.5 MG TABLET
ORAL_TABLET | Freq: Two times a day (BID) | ORAL | 11 refills | 30.00000 days | Status: CP
Start: 2020-12-01 — End: ?

## 2020-12-20 ENCOUNTER — Ambulatory Visit: Admit: 2020-12-20 | Discharge: 2020-12-21 | Payer: MEDICARE | Attending: "Endocrinology | Primary: "Endocrinology

## 2020-12-20 DIAGNOSIS — E230 Hypopituitarism: Principal | ICD-10-CM

## 2021-01-06 ENCOUNTER — Encounter: Admit: 2021-01-06 | Discharge: 2021-01-07 | Payer: MEDICARE

## 2021-01-13 ENCOUNTER — Ambulatory Visit
Admit: 2021-01-13 | Discharge: 2021-01-14 | Payer: MEDICARE | Attending: Cardiovascular Disease | Primary: Cardiovascular Disease

## 2021-01-13 DIAGNOSIS — I2510 Atherosclerotic heart disease of native coronary artery without angina pectoris: Principal | ICD-10-CM

## 2021-02-02 ENCOUNTER — Ambulatory Visit
Admit: 2021-02-02 | Discharge: 2021-02-03 | Payer: MEDICARE | Attending: Student in an Organized Health Care Education/Training Program | Primary: Student in an Organized Health Care Education/Training Program

## 2021-02-02 DIAGNOSIS — D472 Monoclonal gammopathy: Principal | ICD-10-CM

## 2021-02-02 DIAGNOSIS — G6181 Chronic inflammatory demyelinating polyneuritis: Principal | ICD-10-CM

## 2021-02-02 MED ORDER — PREGABALIN 50 MG CAPSULE
ORAL_CAPSULE | Freq: Every evening | ORAL | 2 refills | 90.00000 days | Status: CP
Start: 2021-02-02 — End: 2022-02-02

## 2021-02-04 DIAGNOSIS — R0689 Other abnormalities of breathing: Principal | ICD-10-CM

## 2021-02-04 DIAGNOSIS — D472 Monoclonal gammopathy: Principal | ICD-10-CM

## 2021-03-11 ENCOUNTER — Encounter: Admit: 2021-03-11 | Discharge: 2021-03-11 | Payer: MEDICARE

## 2021-03-11 DIAGNOSIS — E230 Hypopituitarism: Principal | ICD-10-CM

## 2021-03-11 DIAGNOSIS — G6181 Chronic inflammatory demyelinating polyneuritis: Principal | ICD-10-CM

## 2021-03-11 DIAGNOSIS — D472 Monoclonal gammopathy: Principal | ICD-10-CM

## 2021-03-11 DIAGNOSIS — R531 Weakness: Principal | ICD-10-CM

## 2021-03-11 DIAGNOSIS — R0689 Other abnormalities of breathing: Principal | ICD-10-CM

## 2021-03-14 DIAGNOSIS — I2510 Atherosclerotic heart disease of native coronary artery without angina pectoris: Principal | ICD-10-CM

## 2021-03-14 DIAGNOSIS — I10 Essential (primary) hypertension: Principal | ICD-10-CM

## 2021-03-31 ENCOUNTER — Encounter
Admit: 2021-03-31 | Discharge: 2021-04-01 | Payer: MEDICARE | Attending: Cardiovascular Disease | Primary: Cardiovascular Disease

## 2021-03-31 DIAGNOSIS — I2510 Atherosclerotic heart disease of native coronary artery without angina pectoris: Principal | ICD-10-CM

## 2021-03-31 MED ORDER — PREGABALIN 50 MG CAPSULE
ORAL_CAPSULE | Freq: Every evening | ORAL | 0 refills | 30 days | Status: CP
Start: 2021-03-31 — End: 2021-04-30

## 2021-04-04 ENCOUNTER — Ambulatory Visit: Admit: 2021-04-04 | Discharge: 2021-04-05 | Payer: MEDICARE

## 2021-04-04 ENCOUNTER — Encounter: Admit: 2021-04-04 | Discharge: 2021-04-05 | Payer: MEDICARE

## 2021-04-13 DIAGNOSIS — D751 Secondary polycythemia: Principal | ICD-10-CM

## 2021-04-13 DIAGNOSIS — D509 Iron deficiency anemia, unspecified: Principal | ICD-10-CM

## 2021-04-13 DIAGNOSIS — D472 Monoclonal gammopathy: Principal | ICD-10-CM

## 2021-04-19 ENCOUNTER — Ambulatory Visit: Admit: 2021-04-19 | Discharge: 2021-04-20 | Payer: MEDICARE

## 2021-04-20 ENCOUNTER — Ambulatory Visit: Admit: 2021-04-20 | Discharge: 2021-04-21 | Payer: MEDICARE

## 2021-04-20 DIAGNOSIS — D472 Monoclonal gammopathy: Principal | ICD-10-CM

## 2021-04-20 DIAGNOSIS — D751 Secondary polycythemia: Principal | ICD-10-CM

## 2021-04-20 DIAGNOSIS — D509 Iron deficiency anemia, unspecified: Principal | ICD-10-CM

## 2021-04-20 DIAGNOSIS — G6181 Chronic inflammatory demyelinating polyneuritis: Principal | ICD-10-CM

## 2021-05-30 ENCOUNTER — Ambulatory Visit: Admit: 2021-05-30 | Discharge: 2021-05-31 | Payer: MEDICARE

## 2021-06-01 ENCOUNTER — Ambulatory Visit
Admit: 2021-06-01 | Discharge: 2021-06-02 | Payer: MEDICARE | Attending: Student in an Organized Health Care Education/Training Program | Primary: Student in an Organized Health Care Education/Training Program

## 2021-06-01 DIAGNOSIS — G609 Hereditary and idiopathic neuropathy, unspecified: Principal | ICD-10-CM

## 2021-06-01 MED ORDER — PREGABALIN 50 MG CAPSULE
ORAL_CAPSULE | Freq: Every evening | ORAL | 3 refills | 90 days | Status: CP
Start: 2021-06-01 — End: 2022-06-01

## 2021-06-02 DIAGNOSIS — D472 Monoclonal gammopathy: Principal | ICD-10-CM

## 2021-06-02 DIAGNOSIS — G6181 Chronic inflammatory demyelinating polyneuritis: Principal | ICD-10-CM

## 2021-06-07 ENCOUNTER — Ambulatory Visit: Admit: 2021-06-07 | Discharge: 2021-06-08 | Payer: MEDICARE | Attending: Hematology | Primary: Hematology

## 2021-06-07 DIAGNOSIS — D509 Iron deficiency anemia, unspecified: Principal | ICD-10-CM

## 2021-06-07 DIAGNOSIS — I82402 Acute embolism and thrombosis of unspecified deep veins of left lower extremity: Principal | ICD-10-CM

## 2021-06-13 DIAGNOSIS — D472 Monoclonal gammopathy: Principal | ICD-10-CM

## 2021-06-15 ENCOUNTER — Ambulatory Visit: Admit: 2021-06-15 | Discharge: 2021-06-15 | Payer: MEDICARE

## 2021-06-15 DIAGNOSIS — D472 Monoclonal gammopathy: Principal | ICD-10-CM

## 2021-06-15 DIAGNOSIS — D751 Secondary polycythemia: Principal | ICD-10-CM

## 2021-06-21 DIAGNOSIS — D472 Monoclonal gammopathy: Principal | ICD-10-CM

## 2021-06-21 DIAGNOSIS — G6181 Chronic inflammatory demyelinating polyneuritis: Principal | ICD-10-CM

## 2021-06-30 ENCOUNTER — Ambulatory Visit
Admit: 2021-06-30 | Discharge: 2021-07-01 | Payer: MEDICARE | Attending: Cardiovascular Disease | Primary: Cardiovascular Disease

## 2021-06-30 MED ORDER — ROSUVASTATIN 5 MG TABLET
ORAL_TABLET | Freq: Every day | ORAL | 3 refills | 90 days | Status: CP
Start: 2021-06-30 — End: 2022-06-30

## 2021-07-11 ENCOUNTER — Ambulatory Visit: Admit: 2021-07-11 | Discharge: 2021-07-12 | Payer: MEDICARE

## 2021-07-11 DIAGNOSIS — R7303 Prediabetes: Principal | ICD-10-CM

## 2021-07-11 DIAGNOSIS — E2740 Unspecified adrenocortical insufficiency: Principal | ICD-10-CM

## 2021-07-11 DIAGNOSIS — E291 Testicular hypofunction: Principal | ICD-10-CM

## 2021-07-11 DIAGNOSIS — E230 Hypopituitarism: Principal | ICD-10-CM

## 2021-07-11 DIAGNOSIS — E039 Hypothyroidism, unspecified: Principal | ICD-10-CM

## 2021-07-21 DIAGNOSIS — D472 Monoclonal gammopathy: Principal | ICD-10-CM

## 2021-07-21 DIAGNOSIS — G6181 Chronic inflammatory demyelinating polyneuritis: Principal | ICD-10-CM

## 2021-08-21 DIAGNOSIS — D472 Monoclonal gammopathy: Principal | ICD-10-CM

## 2021-08-21 DIAGNOSIS — G6181 Chronic inflammatory demyelinating polyneuritis: Principal | ICD-10-CM

## 2021-08-29 DIAGNOSIS — D472 Monoclonal gammopathy: Principal | ICD-10-CM

## 2021-08-29 DIAGNOSIS — G6181 Chronic inflammatory demyelinating polyneuritis: Principal | ICD-10-CM

## 2021-09-07 IMAGING — DX DG CHEST 1V PORT
1 series · 1 of 1 positions shown · non-contrast
Comparison: 03/22/2009

CLINICAL DATA: Shortness of breath, anxiety

EXAM:
PORTABLE CHEST 1 VIEW

[chest ap]
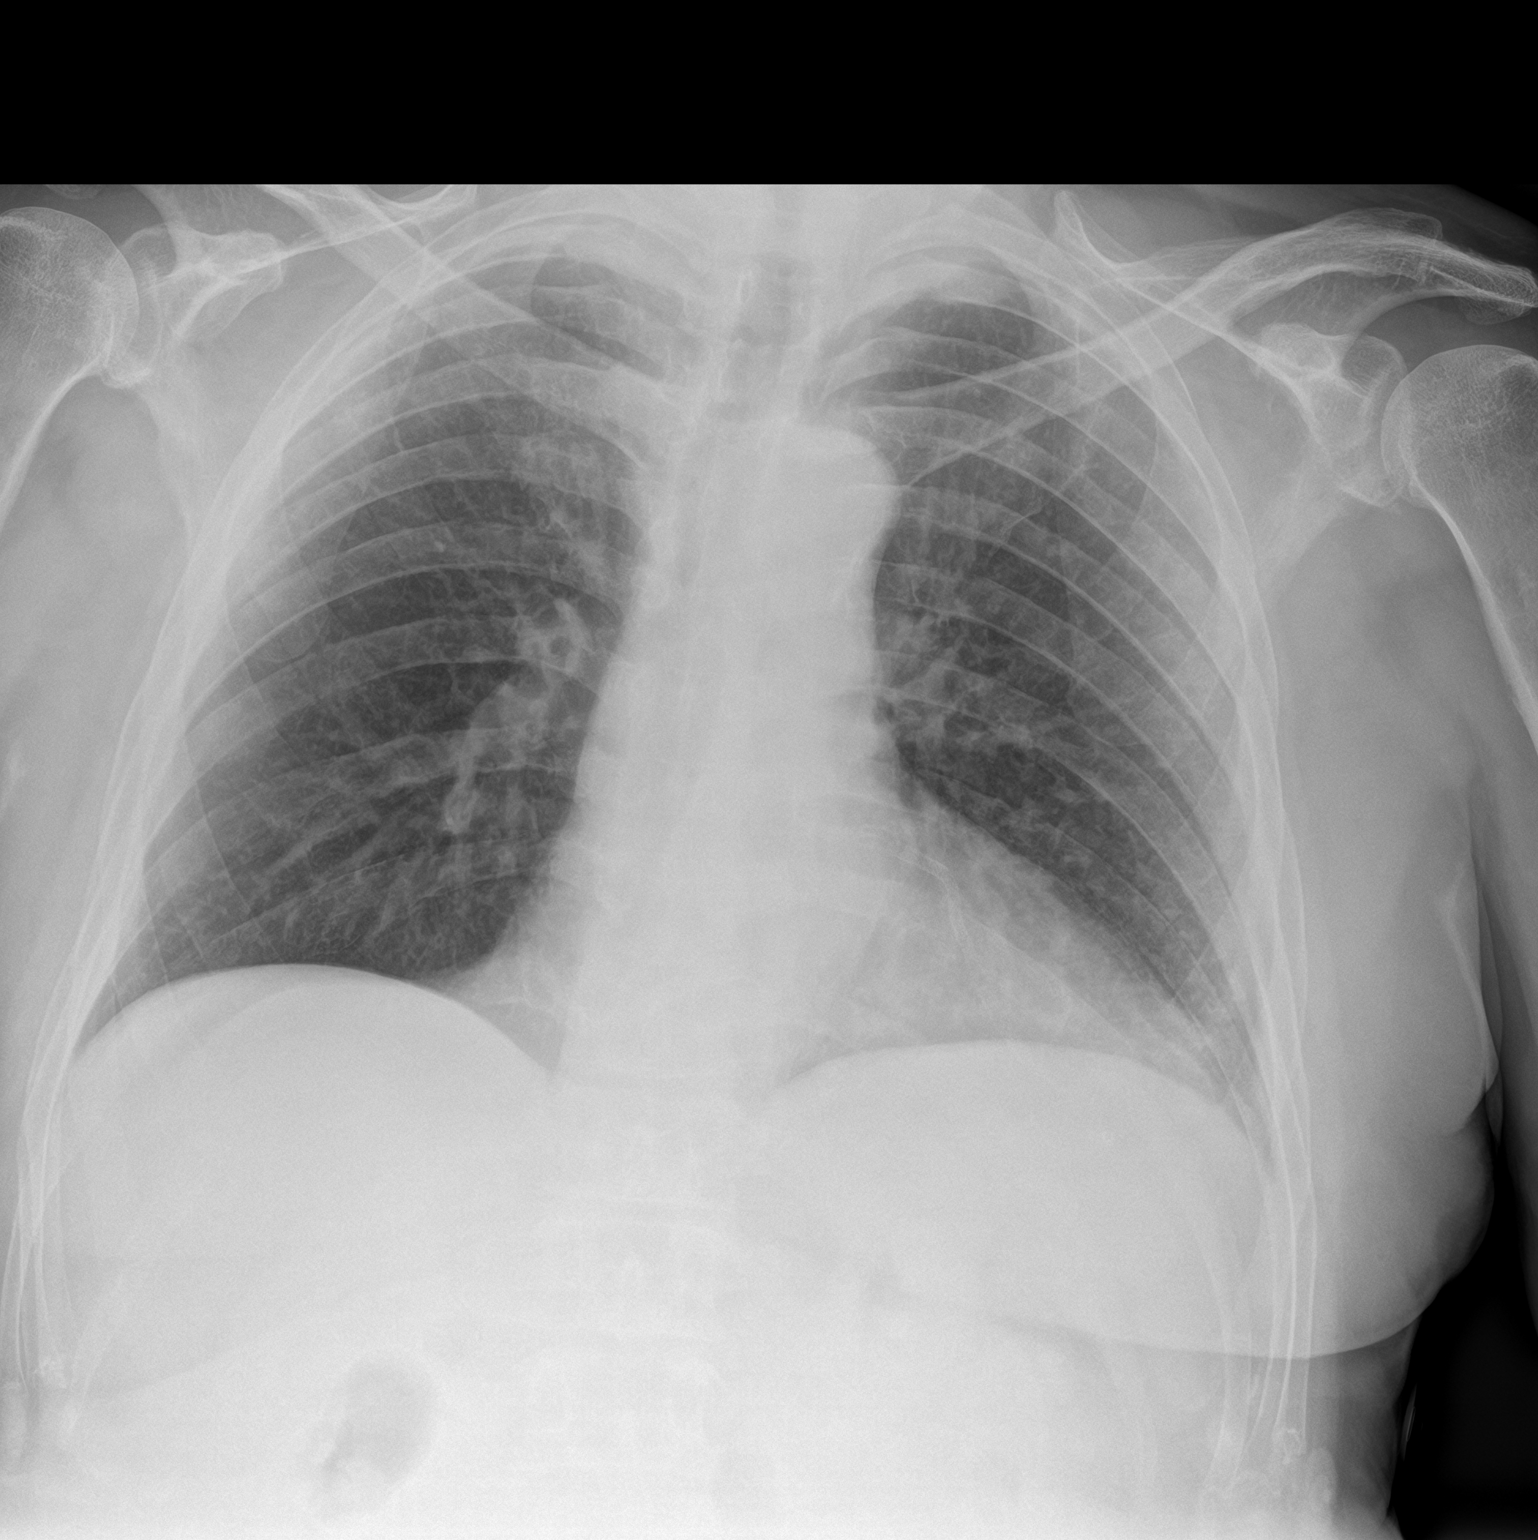

[1 of 1 positions shown; findings below may reference images not displayed]

FINDINGS: The heart size and mediastinal contours are within normal limits.
Both lungs are clear. The visualized skeletal structures are
unremarkable.
IMPRESSION: No acute abnormality of the lungs in AP portable projection.

## 2021-09-21 DIAGNOSIS — G6181 Chronic inflammatory demyelinating polyneuritis: Principal | ICD-10-CM

## 2021-09-21 DIAGNOSIS — D472 Monoclonal gammopathy: Principal | ICD-10-CM

## 2021-09-26 ENCOUNTER — Ambulatory Visit: Admit: 2021-09-26 | Discharge: 2021-09-27 | Payer: MEDICARE

## 2021-09-27 ENCOUNTER — Ambulatory Visit: Admit: 2021-09-27 | Discharge: 2021-09-28 | Payer: MEDICARE

## 2021-09-28 ENCOUNTER — Ambulatory Visit: Admit: 2021-09-28 | Discharge: 2021-09-29 | Payer: MEDICARE

## 2021-09-29 ENCOUNTER — Ambulatory Visit: Admit: 2021-09-29 | Discharge: 2021-09-30 | Payer: MEDICARE

## 2021-09-29 MED ORDER — MULTIVITAMIN WITH MINERALS TABLET
ORAL_TABLET | Freq: Every day | ORAL | 3 refills | 90 days | Status: CP
Start: 2021-09-29 — End: 2022-09-29

## 2021-10-13 ENCOUNTER — Ambulatory Visit
Admit: 2021-10-13 | Discharge: 2021-10-14 | Payer: MEDICARE | Attending: Student in an Organized Health Care Education/Training Program | Primary: Student in an Organized Health Care Education/Training Program

## 2021-10-13 DIAGNOSIS — G4730 Sleep apnea, unspecified: Principal | ICD-10-CM

## 2021-10-13 DIAGNOSIS — G2581 Restless legs syndrome: Principal | ICD-10-CM

## 2021-10-13 DIAGNOSIS — G6181 Chronic inflammatory demyelinating polyneuritis: Principal | ICD-10-CM

## 2021-10-13 MED ORDER — PREGABALIN 75 MG CAPSULE
ORAL_CAPSULE | Freq: Every evening | ORAL | 3 refills | 90 days | Status: CP
Start: 2021-10-13 — End: 2022-10-13

## 2021-10-28 MED ORDER — ELIQUIS 2.5 MG TABLET
ORAL_TABLET | 5 refills | 0 days | Status: CP
Start: 2021-10-28 — End: ?

## 2021-11-06 DIAGNOSIS — G6181 Chronic inflammatory demyelinating polyneuritis: Principal | ICD-10-CM

## 2021-11-08 DIAGNOSIS — G6181 Chronic inflammatory demyelinating polyneuritis: Principal | ICD-10-CM

## 2021-11-14 ENCOUNTER — Ambulatory Visit: Admit: 2021-11-14 | Discharge: 2021-11-15 | Payer: MEDICARE

## 2021-11-15 ENCOUNTER — Ambulatory Visit: Admit: 2021-11-15 | Discharge: 2021-11-16 | Payer: MEDICARE

## 2021-11-21 DIAGNOSIS — G6181 Chronic inflammatory demyelinating polyneuritis: Principal | ICD-10-CM

## 2021-12-05 DIAGNOSIS — G6181 Chronic inflammatory demyelinating polyneuritis: Principal | ICD-10-CM

## 2021-12-05 DIAGNOSIS — G4730 Sleep apnea, unspecified: Principal | ICD-10-CM

## 2021-12-05 MED ORDER — PREGABALIN 75 MG CAPSULE
ORAL_CAPSULE | Freq: Every evening | ORAL | 3 refills | 90 days | Status: CP
Start: 2021-12-05 — End: 2022-12-05

## 2021-12-20 DIAGNOSIS — D751 Secondary polycythemia: Principal | ICD-10-CM

## 2021-12-20 DIAGNOSIS — F17200 Nicotine dependence, unspecified, uncomplicated: Principal | ICD-10-CM

## 2021-12-20 DIAGNOSIS — N183 Stage 3 chronic kidney disease, unspecified whether stage 3a or 3b CKD (CMS-HCC): Principal | ICD-10-CM

## 2021-12-21 ENCOUNTER — Ambulatory Visit: Admit: 2021-12-21 | Discharge: 2021-12-21 | Payer: MEDICARE

## 2021-12-21 DIAGNOSIS — D472 Monoclonal gammopathy: Principal | ICD-10-CM

## 2021-12-21 DIAGNOSIS — N189 Chronic kidney disease, unspecified: Principal | ICD-10-CM

## 2021-12-21 MED ORDER — CYANOCOBALAMIN (VIT B-12) 1,000 MCG SUBLINGUAL TABLET
ORAL_TABLET | Freq: Every day | ORAL | 0 refills | 0 days | Status: CP
Start: 2021-12-21 — End: ?

## 2021-12-22 ENCOUNTER — Ambulatory Visit: Admit: 2021-12-22 | Discharge: 2021-12-23 | Payer: MEDICARE

## 2021-12-26 ENCOUNTER — Ambulatory Visit: Admit: 2021-12-26 | Discharge: 2021-12-27 | Payer: MEDICARE

## 2022-01-09 ENCOUNTER — Ambulatory Visit: Admit: 2022-01-09 | Discharge: 2022-01-10 | Payer: MEDICARE

## 2022-01-09 DIAGNOSIS — R7303 Prediabetes: Principal | ICD-10-CM

## 2022-01-09 DIAGNOSIS — E291 Testicular hypofunction: Principal | ICD-10-CM

## 2022-01-09 DIAGNOSIS — E2740 Unspecified adrenocortical insufficiency: Principal | ICD-10-CM

## 2022-01-09 DIAGNOSIS — E039 Hypothyroidism, unspecified: Principal | ICD-10-CM

## 2022-01-09 DIAGNOSIS — E230 Hypopituitarism: Principal | ICD-10-CM

## 2022-01-09 DIAGNOSIS — Z125 Encounter for screening for malignant neoplasm of prostate: Principal | ICD-10-CM

## 2022-01-09 MED ORDER — TESTOSTERONE CYPIONATE 200 MG/ML INTRAMUSCULAR OIL
INTRAMUSCULAR | 5 refills | 28 days | Status: CP
Start: 2022-01-09 — End: ?

## 2022-01-23 ENCOUNTER — Ambulatory Visit: Admit: 2022-01-23 | Discharge: 2022-01-24 | Payer: MEDICARE

## 2022-01-23 DIAGNOSIS — G6181 Chronic inflammatory demyelinating polyneuritis: Principal | ICD-10-CM

## 2022-01-25 ENCOUNTER — Ambulatory Visit: Admit: 2022-01-25 | Discharge: 2022-01-25 | Payer: MEDICARE

## 2022-02-20 ENCOUNTER — Ambulatory Visit: Admit: 2022-02-20 | Discharge: 2022-02-21 | Payer: MEDICARE

## 2022-02-20 DIAGNOSIS — G6181 Chronic inflammatory demyelinating polyneuritis: Principal | ICD-10-CM

## 2022-02-22 ENCOUNTER — Ambulatory Visit: Admit: 2022-02-22 | Discharge: 2022-02-23 | Payer: MEDICARE

## 2022-03-17 ENCOUNTER — Ambulatory Visit: Admit: 2022-03-17 | Discharge: 2022-03-18 | Payer: MEDICARE | Attending: Nephrology | Primary: Nephrology

## 2022-03-17 DIAGNOSIS — R8281 Pyuria: Principal | ICD-10-CM

## 2022-03-17 DIAGNOSIS — D472 Monoclonal gammopathy: Principal | ICD-10-CM

## 2022-03-17 DIAGNOSIS — N189 Chronic kidney disease, unspecified: Principal | ICD-10-CM

## 2022-03-17 DIAGNOSIS — R8271 Bacteriuria: Principal | ICD-10-CM

## 2022-03-20 ENCOUNTER — Ambulatory Visit: Admit: 2022-03-20 | Discharge: 2022-03-21 | Payer: MEDICARE

## 2022-03-20 DIAGNOSIS — N390 Urinary tract infection, site not specified: Principal | ICD-10-CM

## 2022-03-20 DIAGNOSIS — B9689 Other specified bacterial agents as the cause of diseases classified elsewhere: Principal | ICD-10-CM

## 2022-03-20 MED ORDER — CIPROFLOXACIN 250 MG TABLET
ORAL_TABLET | Freq: Two times a day (BID) | ORAL | 0 refills | 7 days | Status: CP
Start: 2022-03-20 — End: ?

## 2022-03-22 ENCOUNTER — Ambulatory Visit: Admit: 2022-03-22 | Discharge: 2022-03-23 | Payer: MEDICARE

## 2022-04-17 ENCOUNTER — Ambulatory Visit: Admit: 2022-04-17 | Discharge: 2022-04-18 | Payer: MEDICARE

## 2022-04-19 ENCOUNTER — Ambulatory Visit: Admit: 2022-04-19 | Discharge: 2022-04-20 | Payer: MEDICARE

## 2022-05-12 DIAGNOSIS — G6181 Chronic inflammatory demyelinating polyneuritis: Principal | ICD-10-CM

## 2022-05-15 ENCOUNTER — Ambulatory Visit: Admit: 2022-05-15 | Discharge: 2022-05-16 | Payer: MEDICARE

## 2022-05-17 ENCOUNTER — Ambulatory Visit: Admit: 2022-05-17 | Discharge: 2022-05-18 | Payer: MEDICARE

## 2022-05-18 ENCOUNTER — Ambulatory Visit
Admit: 2022-05-18 | Discharge: 2022-05-19 | Payer: MEDICARE | Attending: Cardiovascular Disease | Primary: Cardiovascular Disease

## 2022-05-18 DIAGNOSIS — I10 Essential (primary) hypertension: Principal | ICD-10-CM

## 2022-05-18 DIAGNOSIS — F17200 Nicotine dependence, unspecified, uncomplicated: Principal | ICD-10-CM

## 2022-05-18 DIAGNOSIS — I2510 Atherosclerotic heart disease of native coronary artery without angina pectoris: Principal | ICD-10-CM

## 2022-05-18 DIAGNOSIS — I82402 Acute embolism and thrombosis of unspecified deep veins of left lower extremity: Principal | ICD-10-CM

## 2022-05-18 DIAGNOSIS — I200 Unstable angina: Principal | ICD-10-CM

## 2022-05-18 DIAGNOSIS — I252 Old myocardial infarction: Principal | ICD-10-CM

## 2022-05-18 DIAGNOSIS — I714 Abdominal aortic aneurysm (AAA) without rupture, unspecified part (CMS-HCC): Principal | ICD-10-CM

## 2022-05-22 ENCOUNTER — Ambulatory Visit: Admit: 2022-05-22 | Discharge: 2022-05-22 | Payer: MEDICARE

## 2022-06-01 ENCOUNTER — Ambulatory Visit
Admit: 2022-06-01 | Discharge: 2022-06-02 | Payer: MEDICARE | Attending: Cardiovascular Disease | Primary: Cardiovascular Disease

## 2022-06-01 DIAGNOSIS — I252 Old myocardial infarction: Principal | ICD-10-CM

## 2022-06-01 DIAGNOSIS — I10 Essential (primary) hypertension: Principal | ICD-10-CM

## 2022-06-01 DIAGNOSIS — F17200 Nicotine dependence, unspecified, uncomplicated: Principal | ICD-10-CM

## 2022-06-01 DIAGNOSIS — N1832 Stage 3b chronic kidney disease (CMS-HCC): Principal | ICD-10-CM

## 2022-06-01 DIAGNOSIS — I2510 Atherosclerotic heart disease of native coronary artery without angina pectoris: Principal | ICD-10-CM

## 2022-06-01 DIAGNOSIS — Z789 Other specified health status: Principal | ICD-10-CM

## 2022-06-01 DIAGNOSIS — R0602 Shortness of breath: Principal | ICD-10-CM

## 2022-06-01 DIAGNOSIS — Z9889 Other specified postprocedural states: Principal | ICD-10-CM

## 2022-06-01 MED ORDER — NITROGLYCERIN 0.4 MG SUBLINGUAL TABLET
ORAL_TABLET | SUBLINGUAL | PRN refills | 1 days | Status: CP | PRN
Start: 2022-06-01 — End: ?

## 2022-06-12 ENCOUNTER — Ambulatory Visit: Admit: 2022-06-12 | Discharge: 2022-06-12 | Payer: MEDICARE

## 2022-06-14 ENCOUNTER — Ambulatory Visit: Admit: 2022-06-14 | Discharge: 2022-06-15 | Payer: MEDICARE

## 2022-06-15 ENCOUNTER — Ambulatory Visit: Admit: 2022-06-15 | Discharge: 2022-06-16 | Payer: MEDICARE | Attending: Nephrology | Primary: Nephrology

## 2022-06-15 DIAGNOSIS — G4730 Sleep apnea, unspecified: Principal | ICD-10-CM

## 2022-06-15 DIAGNOSIS — N183 Stage 3 chronic kidney disease, unspecified whether stage 3a or 3b CKD (CMS-HCC): Principal | ICD-10-CM

## 2022-06-15 DIAGNOSIS — G6181 Chronic inflammatory demyelinating polyneuritis: Principal | ICD-10-CM

## 2022-06-15 MED ORDER — CARVEDILOL 6.25 MG TABLET
ORAL_TABLET | Freq: Two times a day (BID) | ORAL | 0 refills | 30 days | Status: CP
Start: 2022-06-15 — End: 2022-07-15

## 2022-06-15 MED ORDER — PREGABALIN 75 MG CAPSULE
ORAL_CAPSULE | Freq: Every evening | ORAL | 3 refills | 90 days | Status: CP
Start: 2022-06-15 — End: 2023-06-15

## 2022-06-21 DIAGNOSIS — D472 Monoclonal gammopathy: Principal | ICD-10-CM

## 2022-06-22 ENCOUNTER — Ambulatory Visit: Admit: 2022-06-22 | Discharge: 2022-06-22 | Payer: MEDICARE

## 2022-06-22 DIAGNOSIS — D472 Monoclonal gammopathy: Principal | ICD-10-CM

## 2022-06-22 DIAGNOSIS — N1832 Stage 3b chronic kidney disease (CMS-HCC): Principal | ICD-10-CM

## 2022-06-22 DIAGNOSIS — G6181 Chronic inflammatory demyelinating polyneuritis: Principal | ICD-10-CM

## 2022-06-28 ENCOUNTER — Ambulatory Visit: Admit: 2022-06-28 | Discharge: 2022-06-29 | Payer: MEDICARE

## 2022-06-28 DIAGNOSIS — G6181 Chronic inflammatory demyelinating polyneuritis: Principal | ICD-10-CM

## 2022-06-28 MED ORDER — MYCOPHENOLATE MOFETIL 250 MG CAPSULE
ORAL_CAPSULE | ORAL | 3 refills | 60 days | Status: CP
Start: 2022-06-28 — End: 2023-08-27

## 2022-06-29 ENCOUNTER — Ambulatory Visit
Admit: 2022-06-29 | Discharge: 2022-06-30 | Payer: MEDICARE | Attending: Cardiovascular Disease | Primary: Cardiovascular Disease

## 2022-06-29 DIAGNOSIS — I10 Essential (primary) hypertension: Principal | ICD-10-CM

## 2022-06-29 DIAGNOSIS — I2510 Atherosclerotic heart disease of native coronary artery without angina pectoris: Principal | ICD-10-CM

## 2022-06-30 DIAGNOSIS — G6181 Chronic inflammatory demyelinating polyneuritis: Principal | ICD-10-CM

## 2022-07-05 DIAGNOSIS — G6181 Chronic inflammatory demyelinating polyneuritis: Principal | ICD-10-CM

## 2022-07-10 ENCOUNTER — Ambulatory Visit: Admit: 2022-07-10 | Discharge: 2022-07-11 | Payer: MEDICARE

## 2022-07-12 ENCOUNTER — Ambulatory Visit: Admit: 2022-07-12 | Discharge: 2022-07-13 | Payer: MEDICARE

## 2022-07-18 DIAGNOSIS — G6181 Chronic inflammatory demyelinating polyneuritis: Principal | ICD-10-CM

## 2022-08-03 ENCOUNTER — Ambulatory Visit
Admit: 2022-08-03 | Discharge: 2022-08-04 | Payer: MEDICARE | Attending: Cardiovascular Disease | Primary: Cardiovascular Disease

## 2022-08-03 DIAGNOSIS — I252 Old myocardial infarction: Principal | ICD-10-CM

## 2022-08-03 DIAGNOSIS — N183 Stage 3 chronic kidney disease, unspecified whether stage 3a or 3b CKD (CMS-HCC): Principal | ICD-10-CM

## 2022-08-03 DIAGNOSIS — I10 Essential (primary) hypertension: Principal | ICD-10-CM

## 2022-08-03 DIAGNOSIS — I2510 Atherosclerotic heart disease of native coronary artery without angina pectoris: Principal | ICD-10-CM

## 2022-08-03 DIAGNOSIS — F17200 Nicotine dependence, unspecified, uncomplicated: Principal | ICD-10-CM

## 2022-08-03 MED ORDER — CARVEDILOL 6.25 MG TABLET
ORAL_TABLET | Freq: Two times a day (BID) | ORAL | 1 refills | 90 days | Status: CP
Start: 2022-08-03 — End: 2023-01-30

## 2022-08-15 ENCOUNTER — Ambulatory Visit: Admit: 2022-08-15 | Discharge: 2022-08-16 | Payer: MEDICARE

## 2022-08-17 ENCOUNTER — Ambulatory Visit: Admit: 2022-08-17 | Discharge: 2022-08-18 | Payer: MEDICARE

## 2022-09-18 ENCOUNTER — Ambulatory Visit: Admit: 2022-09-18 | Discharge: 2022-09-19 | Payer: MEDICARE

## 2022-09-20 ENCOUNTER — Ambulatory Visit: Admit: 2022-09-20 | Discharge: 2022-09-21 | Payer: MEDICARE

## 2022-09-28 ENCOUNTER — Ambulatory Visit
Admit: 2022-09-28 | Discharge: 2022-09-29 | Payer: MEDICARE | Attending: Cardiovascular Disease | Primary: Cardiovascular Disease

## 2022-09-28 DIAGNOSIS — I7143 Infrarenal abdominal aortic aneurysm (AAA) without rupture (CMS-HCC): Principal | ICD-10-CM

## 2022-09-28 DIAGNOSIS — I2510 Atherosclerotic heart disease of native coronary artery without angina pectoris: Principal | ICD-10-CM

## 2022-09-28 DIAGNOSIS — E782 Mixed hyperlipidemia: Principal | ICD-10-CM

## 2022-09-28 DIAGNOSIS — I252 Old myocardial infarction: Principal | ICD-10-CM

## 2022-09-28 DIAGNOSIS — I82402 Acute embolism and thrombosis of unspecified deep veins of left lower extremity: Principal | ICD-10-CM

## 2022-09-28 DIAGNOSIS — E230 Hypopituitarism: Principal | ICD-10-CM

## 2022-09-28 DIAGNOSIS — I10 Essential (primary) hypertension: Principal | ICD-10-CM

## 2022-09-28 DIAGNOSIS — R1011 Right upper quadrant pain: Principal | ICD-10-CM

## 2022-09-28 DIAGNOSIS — F17200 Nicotine dependence, unspecified, uncomplicated: Principal | ICD-10-CM

## 2022-09-28 DIAGNOSIS — Z789 Other specified health status: Principal | ICD-10-CM

## 2022-09-28 MED ORDER — EVOLOCUMAB 140 MG/ML SUBCUTANEOUS PEN INJECTOR
SUBCUTANEOUS | 2 refills | 0 days | Status: CP
Start: 2022-09-28 — End: 2022-12-27
  Filled 2022-10-10: qty 6, 84d supply, fill #0

## 2022-09-29 DIAGNOSIS — I252 Old myocardial infarction: Principal | ICD-10-CM

## 2022-09-29 DIAGNOSIS — E782 Mixed hyperlipidemia: Principal | ICD-10-CM

## 2022-09-29 DIAGNOSIS — I2510 Atherosclerotic heart disease of native coronary artery without angina pectoris: Principal | ICD-10-CM

## 2022-09-29 DIAGNOSIS — Z789 Other specified health status: Principal | ICD-10-CM

## 2022-10-04 NOTE — Unmapped (Signed)
[facility] Specialty Medication Onboarding    Specialty Medication: REPATHA SURECLICK 140 mg/mL Pnij (evolocumab)  Prior Authorization: Approved   Financial Assistance: No - copay  <$25  Final Copay/Day Supply: $11.20 / 28    Insurance Restrictions: None     Notes to Pharmacist:     The triage team has completed the benefits investigation and has determined that the patient is able to fill this medication at [facility]. Please contact the patient to complete the onboarding or follow up with the prescribing physician as needed.

## 2022-10-06 MED ORDER — EMPTY CONTAINER
2 refills | 0 days
Start: 2022-10-06 — End: ?

## 2022-10-06 NOTE — Unmapped (Signed)
[facility]   Specialty Lite Counseling    Christopher Brennan is a 77 y.o. male with hyperlipidemia who I am counseling today on initiation of therapy.  I am speaking to the patient.    Was a Nurse, learning disability used for this call? No    Verified patient's date of birth / HIPAA.    Specialty Lite medication(s) to be sent: General Specialty: Repatha      Non-specialty medications/supplies to be sent: Higher education careers adviser      Medications not needed at this time: n/a         An offer to provide counseling to the patient regarding their medication was made. The patient declined counseling      Current Medications (including OTC/herbals), Comorbidities and Allergies     Current Outpatient Medications   Medication Sig Dispense Refill    amLODIPine (NORVASC) 5 MG tablet Take 1 tablet (5 mg total) by mouth daily. 90 tablet 3    carvedilol (COREG) 6.25 MG tablet Take 2 tablets (12.5 mg total) by mouth two (2) times a day. 360 tablet 1    clopidogrel (PLAVIX) 75 mg tablet Take 1 tablet (75 mg total) by mouth daily. Frequency:QD   Dosage:75   MG  Instructions:  Note:Dose: 75MG  30 tablet 11    cyanocobalamin, vitamin B-12, 1,000 mcg Subl Take 1 tablet by mouth in the morning. 90 tablet 0    ELIQUIS 2.5 mg Tab TAKE 1 TABLET BY MOUTH TWICE DAILY 60 tablet 5    escitalopram oxalate (LEXAPRO) 10 MG tablet Take 2 tablets (20 mg total) by mouth daily.      evolocumab 140 mg/mL PnIj Inject the contents of one pen (140 mg) under the skin every fourteen (14) days. 2 mL 2    ezetimibe (ZETIA) 10 mg tablet Take 2 tablets (20 mg total) by mouth daily.      finasteride (PROSCAR) 5 mg tablet Take 1 tablet (5 mg total) by mouth daily. 30 tablet 12    levothyroxine (SYNTHROID, LEVOTHROID) 100 MCG tablet Take 1 tablet (100 mcg total) by mouth daily. 90 tablet 3    LORazepam (ATIVAN) 0.5 MG tablet Take 1 tablet (0.5 mg total) by mouth two (2) times a day. Can take extra prn      LYRICA 150 mg capsule Take 1 capsule (150 mg total) by mouth daily.      mirtazapine (REMERON) 15 MG tablet Take 1.5 tablets (22.5 mg total) by mouth nightly. Pt is taking 1.5 tablet.      mycophenolate (CELLCEPT) 250 mg capsule Take 1 capsule (250 mg total) by mouth two (2) times a day for 30 days, THEN 2 capsules (500 mg total) two (2) times a day for 30 days, THEN 4 capsules (1,000 mg total) two (2) times a day. 180 capsule 3    nitroglycerin (NITROSTAT) 0.4 MG SL tablet Place 1 tablet (0.4 mg total) under the tongue every five (5) minutes as needed for chest pain. 30 tablet PRN    pantoprazole (PROTONIX) 40 MG tablet Take 1 tablet (40 mg total) by mouth daily.      predniSONE (DELTASONE) 10 MG tablet Take 1 tablet (10 mg total) by mouth daily.      tamsulosin (FLOMAX) 0.4 mg capsule Take 1 capsule (0.4 mg total) by mouth daily. Frequency:QD   Dosage:0.4   MG  Instructions:  Note:Dose: 0.4MG  30 capsule 6    testosterone cypionate (DEPOTESTOTERONE CYPIONATE) 200 mg/mL injection Inject 0.75 mL (150 mg total)  into the muscle every fourteen (14) days. 2 mL 5     No current facility-administered medications for this visit.       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    Appropriateness of Therapy     Prescription has been clinically reviewed: Yes    Financial Information     Medication Assistance provided: Prior Authorization    Anticipated copay of $11.20 (84 days) reviewed with patient.     Patient Specific Needs     Does the patient have any physical, cognitive, or cultural barriers? No    Does the patient have adequate living arrangements? (i.e. the ability to store and take their medication appropriately) Yes    Did you identify any home environmental safety or security hazards? No    Patient prefers to have medications discussed with  Patient     Is the patient or caregiver able to read and understand education materials at a high school level or above? Yes    Patient's primary language is  English     Is the patient high risk? No    SOCIAL DETERMINANTS OF HEALTH     At [facility], we have learned that life circumstances - like trouble affording food, housing, utilities, or transportation can affect the health of many of our patients.   That is why we wanted to ask: are you currently experiencing any life circumstances that are negatively impacting your health and/or quality of life? Patient declined to answer    Social Determinants of Health     Financial Resource Strain: Low Risk  (07/01/2020)    Overall Financial Resource Strain (CARDIA)     Difficulty of Paying Living Expenses: Not hard at all   Recent Concern: Financial Resource Strain - Medium Risk (06/16/2020)    Overall Financial Resource Strain (CARDIA)     Difficulty of Paying Living Expenses: Somewhat hard   Internet Connectivity: Not on file   Food Insecurity: No Food Insecurity (07/01/2020)    Hunger Vital Sign     Worried About Running Out of Food in the Last Year: Never true Ran Out of Food in the Last Year: Never true   Tobacco Use: High Risk (09/28/2022)    Patient History  Smoking Tobacco Use: Every Day     Smokeless Tobacco Use: Former     Passive Exposure: Not on file   Housing/Utilities: Low Risk  (07/01/2020)    Housing/Utilities     Within the past 12 months, have you ever stayed: outside, in a car, in a tent, in an overnight shelter, or temporarily in someone else's home (i.e. couch-surfing)?: No     Are you worried about losing your housing?: No     Within the past 12 months, have you been unable to get utilities (heat, electricity) when it was really needed?: No   Alcohol Use: Not on file   Transportation Needs: No Transportation Needs (07/01/2020)    PRAPARE - Therapist, art (Medical): No     Lack of Transportation (Non-Medical): No   Substance Use: Not on file   Health Literacy: Not on file   Physical Activity: Not on file   Interpersonal Safety: Not on file   Stress: Not on file   Intimate Partner Violence: Not on file   Depression: Not on file   Social Connections: Not on file       Would you be willing to receive help with any of the needs that you have identified today? Not applicable    Delivery Information     Verified delivery address.    Scheduled delivery date: 10/11/22    Expected start date: 10/11/22    Medication will be delivered via UPS to the prescription address in [facility].  This shipment will not require a signature.      Explained the services we provide at [facility] and that each month we will send text messages and/or mychart messages to set up refills. Informed patient that refills should be scheduled 7-10 days prior to when they will run out of medication. Informed patient that a welcome packet, containing information about our pharmacy and other support services, a Notice of Privacy Practices, and a drug information handout will be sent.      The patient or caregiver noted above participated in the development of this care plan and knows that they can request review of or adjustments to the care plan at any time.      Patient or caregiver verbalized understanding of the above information as well as how to contact the pharmacy at [phone] option 4 with any questions/concerns.  The pharmacy is open Monday through Friday 8:30am-4:30pm.  A pharmacist is available 24/7 via pager to answer any clinical questions they may have.      Camillo Flaming, PharmD  [facility] Specialty Pharmacist

## 2022-10-10 MED FILL — EMPTY CONTAINER: 120 days supply | Qty: 1 | Fill #0

## 2022-10-14 ENCOUNTER — Ambulatory Visit: Admit: 2022-10-14 | Discharge: 2022-10-15 | Payer: MEDICARE

## 2022-10-14 MED ADMIN — iohexol (OMNIPAQUE) 350 mg iodine/mL solution 100 mL: 100 mL | INTRAVENOUS | @ 14:00:00 | Stop: 2022-10-14

## 2022-10-20 DIAGNOSIS — I7772 Dissection of iliac artery: Principal | ICD-10-CM

## 2022-10-20 DIAGNOSIS — I7143 Infrarenal abdominal aortic aneurysm (AAA) without rupture (CMS-HCC): Principal | ICD-10-CM

## 2022-10-20 DIAGNOSIS — N3289 Other specified disorders of bladder: Principal | ICD-10-CM

## 2022-10-20 NOTE — Unmapped (Signed)
Called patient to discuss CTA results with multiple findings. Did not answer, left voicemail and will call again early next week.     Infrarenal aortic aneurysm with increase in size to 4.6 cm from prior imaging in 2021. 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 Based on these results will place referral for evaluation by vascular surgery.     In addition to vascular findings, focal bladder wall thickening present along posterior bladder dome, which potentially could be related to BPH and chronic outlet obstruction, but with significant risk factors for bladder cancer will refer to urology for evaluation.

## 2022-10-27 ENCOUNTER — Ambulatory Visit: Admit: 2022-10-27 | Discharge: 2022-11-05 | Payer: MEDICARE

## 2022-10-27 ENCOUNTER — Ambulatory Visit: Admit: 2022-10-27 | Payer: MEDICARE

## 2022-10-27 ENCOUNTER — Ambulatory Visit: Admit: 2022-10-27 | Discharge: 2022-11-05 | Disposition: A | Discharge status: Hospice | Payer: MEDICARE

## 2022-10-27 LAB — SLIDE REVIEW

## 2022-10-27 LAB — URINALYSIS WITH MICROSCOPY WITH CULTURE REFLEX
BACTERIA: NONE SEEN /HPF
GLUCOSE UA: NEGATIVE
KETONES UA: NEGATIVE
NITRITE UA: NEGATIVE
PH UA: 6 (ref 5.0–9.0)
PROTEIN UA: 30 — AB
RBC UA: 14 /HPF — ABNORMAL HIGH (ref <=3)
RENAL TUBULAR EPITHELIAL CELLS: 1 /HPF — ABNORMAL HIGH (ref <=0)
SPECIFIC GRAVITY UA: 1.015 (ref 1.005–1.040)
SQUAMOUS EPITHELIAL: 1 /HPF (ref 0–5)
UROBILINOGEN UA: 4 — AB
WBC UA: 117 /HPF — ABNORMAL HIGH (ref <=2)

## 2022-10-27 LAB — CBC W/ AUTO DIFF
BASOPHILS ABSOLUTE COUNT: 0.1 10*9/L (ref 0.0–0.1)
BASOPHILS RELATIVE PERCENT: 1.2 %
EOSINOPHILS ABSOLUTE COUNT: 0.1 10*9/L (ref 0.0–0.5)
EOSINOPHILS RELATIVE PERCENT: 1.9 %
HEMATOCRIT: 50.8 % — ABNORMAL HIGH (ref 39.0–48.0)
HEMOGLOBIN: 16.5 g/dL (ref 12.9–16.5)
LYMPHOCYTES ABSOLUTE COUNT: 0.9 10*9/L — ABNORMAL LOW (ref 1.1–3.6)
LYMPHOCYTES RELATIVE PERCENT: 11.7 %
MEAN CORPUSCULAR HEMOGLOBIN CONC: 32.5 g/dL (ref 32.0–36.0)
MEAN CORPUSCULAR HEMOGLOBIN: 27.2 pg (ref 25.9–32.4)
MEAN CORPUSCULAR VOLUME: 83.6 fL (ref 77.6–95.7)
MEAN PLATELET VOLUME: 9.8 fL (ref 6.8–10.7)
MONOCYTES ABSOLUTE COUNT: 0.5 10*9/L (ref 0.3–0.8)
MONOCYTES RELATIVE PERCENT: 7.5 %
NEUTROPHILS ABSOLUTE COUNT: 5.7 10*9/L (ref 1.8–7.8)
NEUTROPHILS RELATIVE PERCENT: 77.7 %
NUCLEATED RED BLOOD CELLS: 0 /100{WBCs} (ref <=4)
PLATELET COUNT: 114 10*9/L — ABNORMAL LOW (ref 150–450)
RED BLOOD CELL COUNT: 6.08 10*12/L — ABNORMAL HIGH (ref 4.26–5.60)
RED CELL DISTRIBUTION WIDTH: 21.8 % — ABNORMAL HIGH (ref 12.2–15.2)
WBC ADJUSTED: 7.3 10*9/L (ref 3.6–11.2)

## 2022-10-27 LAB — B-TYPE NATRIURETIC PEPTIDE: B-TYPE NATRIURETIC PEPTIDE: 149.21 pg/mL — ABNORMAL HIGH (ref <=100)

## 2022-10-27 LAB — LACTATE SEPSIS, VENOUS
LACTATE BLOOD VENOUS: 2.5 mmol/L (ref 0.5–1.8)
LACTATE BLOOD VENOUS: 2.6 mmol/L (ref 0.5–1.8)

## 2022-10-27 LAB — COMPREHENSIVE METABOLIC PANEL
ALBUMIN: 2.7 g/dL — ABNORMAL LOW (ref 3.4–5.0)
ALKALINE PHOSPHATASE: 766 U/L — ABNORMAL HIGH (ref 46–116)
ALT (SGPT): 158 U/L — ABNORMAL HIGH (ref 10–49)
ANION GAP: 6 mmol/L (ref 5–14)
AST (SGOT): 194 U/L — ABNORMAL HIGH (ref <=34)
BILIRUBIN TOTAL: 2.2 mg/dL — ABNORMAL HIGH (ref 0.3–1.2)
BLOOD UREA NITROGEN: 36 mg/dL — ABNORMAL HIGH (ref 9–23)
BUN / CREAT RATIO: 17
CALCIUM: 8.7 mg/dL (ref 8.7–10.4)
CHLORIDE: 112 mmol/L — ABNORMAL HIGH (ref 98–107)
CO2: 20.7 mmol/L (ref 20.0–31.0)
CREATININE: 2.06 mg/dL — ABNORMAL HIGH
EGFR CKD-EPI (2021) MALE: 33 mL/min/{1.73_m2} — ABNORMAL LOW (ref >=60)
GLUCOSE RANDOM: 100 mg/dL (ref 70–179)
POTASSIUM: 4.4 mmol/L (ref 3.4–4.8)
PROTEIN TOTAL: 6.4 g/dL (ref 5.7–8.2)
SODIUM: 139 mmol/L (ref 135–145)

## 2022-10-27 LAB — LACTATE SEPSIS, VENOUS 2
LACTATE BLOOD VENOUS: 4.5 mmol/L (ref 0.5–1.8)
LACTATE BLOOD VENOUS: 1.7 mmol/L (ref 0.5–1.8)

## 2022-10-27 LAB — HIGH SENSITIVITY TROPONIN I - SINGLE: HIGH SENSITIVITY TROPONIN I: 13 ng/L (ref <=53)

## 2022-10-27 MED ADMIN — lactated ringers bolus 1,000 mL: 1000 mL | INTRAVENOUS | @ 21:00:00 | Stop: 2022-10-27

## 2022-10-27 MED ADMIN — ipratropium-albuterol (DUO-NEB) 0.5-2.5 mg/3 mL nebulizer solution 3 mL: 3 mL | RESPIRATORY_TRACT | @ 16:00:00 | Stop: 2022-10-27

## 2022-10-27 MED ADMIN — iohexol (OMNIPAQUE) 350 mg iodine/mL solution 100 mL: 100 mL | INTRAVENOUS | @ 18:00:00 | Stop: 2022-10-27

## 2022-10-27 MED ADMIN — piperacillin-tazobactam (ZOSYN) IVPB (premix) 4.5 g: 4.5 g | INTRAVENOUS | @ 22:00:00 | Stop: 2022-10-27

## 2022-10-27 MED ADMIN — lactated ringers bolus 1,000 mL: 1000 mL | INTRAVENOUS | @ 16:00:00 | Stop: 2022-10-27

## 2022-10-27 NOTE — Unmapped (Signed)
[facility]  Emergency Department Provider Note          ED Clinical Impression      Final diagnoses:   Malignancy (CMS-HCC) (Primary)   Acute kidney injury (CMS-HCC)   Elevated LFTs   Lactic acidosis            Impression, Medical Decision Making, Progress Notes and Critical Care      Christopher Brennan is a 77 y.o. 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     On exam patient appears fatigued, hypotensive to 92/63.  Skin tear noted to right upper extremity.  Diffuse inspiratory and expiratory wheeze which is likely baseline from his COPD. Abdominal exam with mild LLQ tenderness. Rectal exam with normal tone. No masses. No stool in vault. Guaiac negative.     Ddx/Medical Decision-Making: I suspect he is dehydrated given the 3 weeks of diarrhea, likely leading to this generalized weakness and lightheadedness/presyncope.  Sepsis considered, less likely with no fever, overall relatively benign abdominal exam, will obtain screening lactate, blood cultures.  Will obtain CT abdomen to evaluate for possible diverticulitis.  Also will send C. difficile as this could be the cause of his diarrhea.  Giving fluids, will reassess.    ED Course as of 10/27/22 1417   Fri Oct 27, 2022   1012 My independent interpretation of the EKG is   Normal sinus rhythm, rate of 55, normal axis, normal intervals, no ischemic changes.   1118 Lactate, Venous(!!): 2.5   1119 X-ray read as showing diffuse bronchial wall thickening, I suspected 3 cm mass in right upper lobe, recommended nonurgent CT to characterize.   1120 RBC(!): 6.08   1120 HCT(!): 50.8   1120 Elevated hemoglobin, seen previously, likely related to underlying lung disease.   1120 Platelet(!): 114   1121 New thrombocytopenia   1242 Creatinine(!): 2.06   1242 Creatinine increased from prior 1.7   1242 SGOT (AST)(!): 194   1242 ALT(!): 158   1242 Alkaline Phosphatase(!): 766   1242 LFTs elevated which is new from 4 months ago   1242 BNP(!): 149.21   1242 Lactate, Venous(!!): 2.5   1405 Lactate, Venous(!!): 4.5   1415 This presentation is concerning for new malignancy.  Multiple lesions noted in liver which likely metastatic.  Primary unknown.  He does have a 3 cm nodule in lung.  Obtaining CT chest.  Given rising creatinine and lactate, he will need to be admitted for further management and tissue diagnosis can be performed at that time.  MAO paged for admission.  Patient updated about results and plan.              History        Reason for Visit  Medical Problem      HPI   Christopher Brennan is a 77 y.o. 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 Patient reports 3 weeks of worsening generalized weakness, lightheadedness, and hypotension on home monitor. Patient also notes sustaining several falls over the past few weeks 2/2 his lightheadedness and presyncope. Of note, patient has been experiencing 3 weeks of persistent abdominal pain and diarrhea that he states has been black in color.  He has been taking imodium without relief of symptoms. He is currently anticoagulated on Eliquis. He does note current tobacco use. No recent antibiotic use. Patient denies fever, chills, or chest pain.         Outside Historian(s):  Patient's family member at bedside    External Records Reviewed  Cardiology office visit 09/28/2022 for relevant past medical history      Past Medical History:   Diagnosis Date    Angina pectoris (CMS-HCC) 06/24/2018    Added automatically from request for surgery [id number]    Anxiety     CAD S/P percutaneous coronary angioplasty     GERD (gastroesophageal reflux disease)     Hypercholesteremia     Hypertension Myocardial infarction (CMS-HCC)     3 MIs    Panhypopituitarism (CMS-HCC)     Tobacco abuse     Unstable angina (CMS-HCC) 04/05/2018       Patient Active Problem List   Diagnosis    Coronary artery disease involving native coronary artery of native heart without angina pectoris    Essential hypertension    Urge urinary incontinence    Tobacco use disorder    Panhypopituitarism (CMS-HCC)    CKD (chronic kidney disease), stage III    Shortness of breath    Acquired central hypothyroidism    Anxiety    Falls    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)    Old MI (myocardial infarction)    Weakness    Pyelonephritis    Vitamin D deficiency    Iron deficiency anemia    Major depressive disorder, single episode, unspecified    Hypothyroidism    Gastro-esophageal reflux disease without esophagitis    Atopic dermatitis    Abdominal aortic aneurysm without rupture (CMS-HCC)    Deep vein thrombosis (DVT) of left lower extremity (CMS-HCC)    Monoclonal gammopathy    Statin intolerance    Mixed hyperlipidemia    Iliac artery dissection (CMS-HCC)       Past Surgical History:   Procedure Laterality Date    CARDIAC SURGERY      PR CATH PLACE/CORON ANGIO, IMG SUPER/INTERP,W LEFT HEART VENTRICULOGRAPHY N/A 10/29/2015    Procedure: Left Heart Catheterization W Intervention;  Surgeon: Orpha Bur, MD;  Location: [facility] CATH;  Service: Cardiology    PR CATH PLACE/CORON ANGIO, IMG SUPER/INTERP,W LEFT HEART VENTRICULOGRAPHY N/A 08/01/2016    Procedure: Left Heart Catheterization W Intervention;  Surgeon: Alvira Philips, MD;  Location: [facility] CATH;  Service: Cardiology    PR CATH PLACE/CORON ANGIO, IMG SUPER/INTERP,W LEFT HEART VENTRICULOGRAPHY N/A 03/05/2017    Procedure: Left Heart Catheterization W Intervention;  Surgeon: Alvira Philips, MD;  Location: [facility] CATH;  Service: Cardiology    PR CATH PLACE/CORON ANGIO, IMG SUPER/INTERP,W LEFT HEART VENTRICULOGRAPHY N/A 08/06/2017    Procedure: CATH LEFT HEART CATHETERIZATION W INTERVENTION;  Surgeon: Alvira Philips, MD;  Location: [facility] CATH;  Service: Cardiology    PR CATH PLACE/CORON ANGIO, IMG SUPER/INTERP,W LEFT HEART VENTRICULOGRAPHY N/A 04/09/2018    Procedure: Left Heart Catheterization W Intervention;  Surgeon: Alvira Philips, MD;  Location: [facility] CATH;  Service: Cardiology    PR CATH PLACE/CORON ANGIO, IMG SUPER/INTERP,W LEFT HEART VENTRICULOGRAPHY N/A 07/03/2018    Procedure: CATH LEFT HEART CATHETERIZATION W INTERVENTION;  Surgeon: Alvira Philips, MD;  Location: [facility] CATH;  Service: Cardiology    PR CATH PLACE/CORON ANGIO, IMG SUPER/INTERP,W LEFT HEART VENTRICULOGRAPHY N/A 10/29/2019    Procedure: Left Heart Catheterization;  Surgeon: Ernestine Mcmurray  Jaymes Graff, MD;  Location: [facility] CATH;  Service: Cardiology    PR CATH PLACE/CORON ANGIO, IMG SUPER/INTERP,W LEFT HEART VENTRICULOGRAPHY N/A 01/01/2020    Procedure: Left Heart Catheterization W Intervention;  Surgeon: Rosana Hoes, MD;  Location: [facility] CATH;  Service: Cardiology    PR CATH PLACE/CORON ANGIO, IMG SUPER/INTERP,W LEFT HEART VENTRICULOGRAPHY N/A 05/22/2022    Procedure: Left Heart Catheterization;  Surgeon: Alvira Philips, MD;  Location: [facility] CATH;  Service: Cardiology       No current facility-administered medications for this encounter.    Current Outpatient Medications:     amLODIPine (NORVASC) 5 MG tablet, Take 1 tablet (5 mg total) by mouth daily., Disp: 90 tablet, Rfl: 3    carvedilol (COREG) 6.25 MG tablet, Take 2 tablets (12.5 mg total) by mouth two (2) times a day., Disp: 360 tablet, Rfl: 1    clopidogrel (PLAVIX) 75 mg tablet, Take 1 tablet (75 mg total) by mouth daily. Frequency:QD   Dosage:75   MG  Instructions:  Note:Dose: 75MG , Disp: 30 tablet, Rfl: 11    cyanocobalamin, vitamin B-12, 1,000 mcg Subl, Take 1 tablet by mouth in the morning., Disp: 90 tablet, Rfl: 0    ELIQUIS 2.5 mg Tab, TAKE 1 TABLET BY MOUTH TWICE DAILY, Disp: 60 tablet, Rfl: 5 empty container (SHARPS-A-GATOR DISPOSAL SYSTEM) Misc, Use as directed for sharps disposal, Disp: 1 each, Rfl: 2    escitalopram oxalate (LEXAPRO) 10 MG tablet, Take 2 tablets (20 mg total) by mouth daily., Disp: , Rfl:     evolocumab 140 mg/mL PnIj, Inject the contents of one pen (140 mg) under the skin every fourteen (14) days., Disp: 2 mL, Rfl: 2    ezetimibe (ZETIA) 10 mg tablet, Take 2 tablets (20 mg total) by mouth daily., Disp: , Rfl:     finasteride (PROSCAR) 5 mg tablet, Take 1 tablet (5 mg total) by mouth daily., Disp: 30 tablet, Rfl: 12    levothyroxine (SYNTHROID, LEVOTHROID) 100 MCG tablet, Take 1 tablet (100 mcg total) by mouth daily., Disp: 90 tablet, Rfl: 3    LORazepam (ATIVAN) 0.5 MG tablet, Take 1 tablet (0.5 mg total) by mouth two (2) times a day. Can take extra prn, Disp: , Rfl:     LYRICA 150 mg capsule, Take 1 capsule (150 mg total) by mouth daily., Disp: , Rfl:     mirtazapine (REMERON) 15 MG tablet, Take 1.5 tablets (22.5 mg total) by mouth nightly. Pt is taking 1.5 tablet., Disp: , Rfl:     mycophenolate (CELLCEPT) 250 mg capsule, Take 1 capsule (250 mg total) by mouth two (2) times a day for 30 days, THEN 2 capsules (500 mg total) two (2) times a day for 30 days, THEN 4 capsules (1,000 mg total) two (2) times a day., Disp: 180 capsule, Rfl: 3    nitroglycerin (NITROSTAT) 0.4 MG SL tablet, Place 1 tablet (0.4 mg total) under the tongue every five (5) minutes as needed for chest pain., Disp: 30 tablet, Rfl: PRN    pantoprazole (PROTONIX) 40 MG tablet, Take 1 tablet (40 mg total) by mouth daily., Disp: , Rfl:     predniSONE (DELTASONE) 10 MG tablet, Take 1 tablet (10 mg total) by mouth daily., Disp: , Rfl:     tamsulosin (FLOMAX) 0.4 mg capsule, Take 1 capsule (0.4 mg total) by mouth daily. Frequency:QD   Dosage:0.4   MG  Instructions:  Note:Dose: 0.4MG , Disp: 30 capsule, Rfl: 6    testosterone cypionate (DEPOTESTOTERONE CYPIONATE) 200 mg/mL  injection, Inject 0.75 mL (150 mg total) into the muscle every fourteen (14) days., Disp: 2 mL, Rfl: 5    Allergies  Statins-hmg-coa reductase inhibitors and Doxycycline    Family History   Problem Relation Age of Onset    Hypertension Father     Cancer Sister     Hypertension Sister     Cancer Brother     Coronary artery disease Paternal Uncle     Anemia Neg Hx     Diabetes Neg Hx     Kidney disease Neg Hx     Obesity Neg Hx     Thyroid disease Neg Hx     Osteoporosis Neg Hx        Social History  Social History     Tobacco Use    Smoking status: Every Day     Current packs/day: 1.50     Average packs/day: 1.5 packs/day for 60.0 years (90.0 ttl pk-yrs)     Types: Cigarettes    Smokeless tobacco: Former    Tobacco comments:     1-1.5ppd. Aims to reduce use.   Vaping Use    Vaping status: Never Used   Substance Use Topics    Alcohol use: No     Alcohol/week: 0.0 standard drinks of alcohol    Drug use: Not Currently       Review of Systems  All other systems have been reviewed and are negative except as otherwise documented.       Physical Exam     ED Triage Vitals [10/27/22 1006]   Enc Vitals Group      BP 92/63      Heart Rate 55      SpO2 Pulse       Resp 17      Temp       Temp src       SpO2 96 %       Constitutional: Alert and oriented. Appears fatigued.  Eyes: Conjunctivae are normal.  ENT       Head: Normocephalic and atraumatic.       Nose: No congestion.       Mouth/Throat: Mucous membranes are moist.       Neck: No stridor.  Cardiovascular: Normal rate, regular rhythm. Distal pulses are present.  Respiratory: Diffuse inspiratory and expiratory wheeze  Gastrointestinal: Abdomen soft, nondistended, mild LLQ tenderness without guarding or rebound. Rectal exam with normal tone. No masses. No stool in vault. Guaiac negative.   Musculoskeletal: Normal range of motion in all extremities.       Right lower leg: No tenderness or edema.       Left lower leg: No tenderness or edema.  Neurologic: Normal speech and language. No gross focal neurologic deficits are appreciated.  Skin: Skin tear to the right upper arm. No rash noted.  Psychiatric: Mood and affect are normal. Speech and behavior are normal.     Radiology     CT Abdomen Pelvis W IV Contrast   Preliminary Result   Innumerable ill-defined masses throughout the liver with trace perihepatic ascites and associated enlarged periportal lymph node, concerning for metastatic disease.      Irregular circumferential thickening of the bladder wall, most prominent within the bladder dome. Recommend urologic consultation.      Subcentimeter hypodensity within the spleen is indeterminate.      Stable saccular infrarenal abdominal aneurysm measuring up to 4.6 cm since February 2024.  XR Chest 2 views   Final Result      Diffuse bronchial wall thickening due to asthma or bronchitis.      Suspect 3 cm mass in the right upper lobe. Recommend further evaluation with CT of the chest.         FOLLOW-UP RECOMMENDATION:      Item for Follow Up:   1. Acuity: Non-urgent   2. Modality: CT   3. Anatomy: Chest   4. TimeFrame: ASAP         CT Chest Wo Contrast    (Results Pending)          Procedures     N/A       Documentation assistance was provided by Winferd Humphrey, Scribe, on October 27, 2022 at 10:31 AM for Wylene Simmer, MD.    October 27, 2022 10:54 AM. Documentation assistance provided by the scribe. I was present during the time the encounter was recorded. The information recorded by the scribe was done at my direction and has been reviewed and validated by me.          Mauri Pole, MD  10/27/22 [phone]

## 2022-10-27 NOTE — Unmapped (Signed)
Outpatient Services [facility]  Emergency Department Progress Note for Continuation of Care      October 27, 2022 3:00 PM    Christopher Brennan is a 77 y.o. male who was signed out to me. I have reviewed the available documentation and test results.     Brief History and Assessment:     Briefly, this is a 77 y.o. 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 During his stay in the ED thus far, he had a CT A/P performed that showed innumerable ill-defined masses throughout the liver and spleen, concerning for metastatic disease. Regarding this, he has a CT chest ordered for evaluation of potential primary metastatic disease location; this is pending at sign out. He has also been found to have an increasing lactate, initial at 2.5, repeat at 4.5. He has been paged out to the MAO for admission at [facility] for biopsy and treatment initiation with oncology.      [facility] ED Course:     3:27 PM  Patient reassessed who remains hemodynamically stable, stating he feels improved from when he initially arrived to the ED, despite his lactate up-trending to 4.5. He has not yet provided a urine sample. Per chart review, his EF is estimated to be at 60% from cardiac catheterization during 05/2022. He has already received 1 L of IV fluids. Given his up-trending lactate, will provide additional IV fluids and antibiotics. Will also repeat lactate to ensure this is not erroneous.    4:38 PM  Spoke with hospitalist at [facility] who has accepted the patient for admission. Repeat lactate returned at 2.6. Lactate that was elevated at 4.5 from 1352 likely erroneous.      ED Clinical Impression     Final diagnoses:   Malignancy (CMS-HCC) (Primary)   Acute kidney injury (CMS-HCC)   Elevated LFTs   Lactic acidosis Documentation assistance was provided by Cherly Hensen, Scribe, on October 27, 2022 at 3:23 PM for [facility], MD.    Documentation assistance was provided by the scribe in my presence.  The documentation recorded by the scribe has been reviewed by me and accurately reflects the services I personally performed.     Note has been documented by Lendon Collar. Mothes on 10/27/2022

## 2022-10-27 NOTE — Unmapped (Signed)
Pt c/o having diarrhea for the past week and has had dizziness with near syncope, has had multiple falls, has a skin tear to the right upper arm that is red and draining. Pt is a/ox4 on arrival with daughter N law. Pt reports his stools being black in color, has been taking imodium at home  for the diarrhea.

## 2022-10-27 NOTE — Unmapped (Signed)
ISAR Screening- for all patients 75+ community-dwelling patients    Before the illness or injury that brought you to the Emergency Department, did you need someone to help you on a regular basis?  NO  Since the illness or injury that brought you to the Emergency Department, have you needed more help than usual to take care of yourself?  NO  Have you been hospitalized for one or more nights during the past six months (excluding a stay in the Emergency Department)?  NO  In general, do you see well? YES  In general, do you have serious problems with your memory?  NO  Do you take more than five different prescribed medications every day?  YES    A score of 3 or more will require a Case Management consultation. Paged [phone]. Primary RN notified of score of 3 or more and CM paged for assessment.     Score=1

## 2022-10-27 NOTE — Unmapped (Signed)
Pt is coming in with c/o having some generalized weakness and diarrhea for the past 3 weeks. He has been syncopal with some low blood pressure. Pt in no distress but he just feels bad. Feel about 1 week ago and has a skin tear on the right upper arm. He is also taking blood thinners.

## 2022-10-28 LAB — HEPATIC FUNCTION PANEL
ALBUMIN: 2.6 g/dL — ABNORMAL LOW (ref 3.4–5.0)
ALKALINE PHOSPHATASE: 776 U/L — ABNORMAL HIGH (ref 46–116)
ALT (SGPT): 158 U/L — ABNORMAL HIGH (ref 10–49)
AST (SGOT): 247 U/L — ABNORMAL HIGH (ref <=34)
BILIRUBIN DIRECT: 1.4 mg/dL — ABNORMAL HIGH (ref 0.00–0.30)
BILIRUBIN TOTAL: 1.8 mg/dL — ABNORMAL HIGH (ref 0.3–1.2)
PROTEIN TOTAL: 6.2 g/dL (ref 5.7–8.2)

## 2022-10-28 LAB — CBC
HEMATOCRIT: 49 % — ABNORMAL HIGH (ref 39.0–48.0)
HEMOGLOBIN: 16.1 g/dL (ref 12.9–16.5)
MEAN CORPUSCULAR HEMOGLOBIN CONC: 32.9 g/dL (ref 32.0–36.0)
MEAN CORPUSCULAR HEMOGLOBIN: 27.6 pg (ref 25.9–32.4)
MEAN CORPUSCULAR VOLUME: 84 fL (ref 77.6–95.7)
MEAN PLATELET VOLUME: 10 fL (ref 6.8–10.7)
PLATELET COUNT: 107 10*9/L — ABNORMAL LOW (ref 150–450)
RED BLOOD CELL COUNT: 5.84 10*12/L — ABNORMAL HIGH (ref 4.26–5.60)
RED CELL DISTRIBUTION WIDTH: 21.6 % — ABNORMAL HIGH (ref 12.2–15.2)
WBC ADJUSTED: 8.3 10*9/L (ref 3.6–11.2)

## 2022-10-28 LAB — BASIC METABOLIC PANEL
ANION GAP: 8 mmol/L (ref 5–14)
BLOOD UREA NITROGEN: 29 mg/dL — ABNORMAL HIGH (ref 9–23)
BUN / CREAT RATIO: 14
CALCIUM: 8.7 mg/dL (ref 8.7–10.4)
CHLORIDE: 115 mmol/L — ABNORMAL HIGH (ref 98–107)
CO2: 18 mmol/L — ABNORMAL LOW (ref 20.0–31.0)
CREATININE: 2.09 mg/dL — ABNORMAL HIGH
EGFR CKD-EPI (2021) MALE: 32 mL/min/{1.73_m2} — ABNORMAL LOW (ref >=60)
GLUCOSE RANDOM: 100 mg/dL (ref 70–179)
POTASSIUM: 4.3 mmol/L (ref 3.4–4.8)
SODIUM: 141 mmol/L (ref 135–145)

## 2022-10-28 LAB — APTT
APTT: 33.9 s (ref 24.8–38.4)
APTT: 102.9 s — ABNORMAL HIGH (ref 24.8–38.4)
HEPARIN CORRELATION: 0.2
HEPARIN CORRELATION: 0.6

## 2022-10-28 LAB — PHOSPHORUS: PHOSPHORUS: 1.9 mg/dL — ABNORMAL LOW (ref 2.4–5.1)

## 2022-10-28 LAB — MAGNESIUM: MAGNESIUM: 2.1 mg/dL (ref 1.6–2.6)

## 2022-10-28 LAB — PROTIME-INR
INR: 1.07
PROTIME: 12 s (ref 9.9–12.6)

## 2022-10-28 MED ADMIN — piperacillin-tazobactam (ZOSYN) 3.375 g in sodium chloride 0.9 % (NS) 100 mL IVPB-MBP: 3.375 g | INTRAVENOUS | @ 16:00:00 | Stop: 2022-11-04

## 2022-10-28 MED ADMIN — ezetimibe (ZETIA) tablet 20 mg: 20 mg | ORAL | @ 14:00:00

## 2022-10-28 MED ADMIN — finasteride (PROSCAR) tablet 5 mg: 5 mg | ORAL | @ 14:00:00

## 2022-10-28 MED ADMIN — calcium carbonate (TUMS) chewable tablet 200 mg of elem calcium: 200 mg | ORAL | @ 19:00:00

## 2022-10-28 MED ADMIN — LORazepam (ATIVAN) tablet 0.5 mg: .5 mg | ORAL

## 2022-10-28 MED ADMIN — aspirin chewable tablet 81 mg: 81 mg | ORAL | @ 16:00:00

## 2022-10-28 MED ADMIN — LORazepam (ATIVAN) tablet 0.5 mg: .5 mg | ORAL | @ 19:00:00 | Stop: 2022-10-28

## 2022-10-28 MED ADMIN — tamsulosin (FLOMAX) 24 hr capsule 0.4 mg: .4 mg | ORAL | @ 14:00:00

## 2022-10-28 MED ADMIN — heparin 25,000 Units/250 mL (100 units/mL) in 0.45% saline infusion (premade): 0-24 [IU]/kg/h | INTRAVENOUS | @ 21:00:00

## 2022-10-28 MED ADMIN — levothyroxine (SYNTHROID) tablet 100 mcg: 100 ug | ORAL | @ 10:00:00

## 2022-10-28 MED ADMIN — pantoprazole (Protonix) EC tablet 40 mg: 40 mg | ORAL | @ 14:00:00

## 2022-10-28 MED ADMIN — mycophenolate (CELLCEPT) capsule 1,000 mg: 1000 mg | ORAL | @ 14:00:00 | Stop: 2023-08-26

## 2022-10-28 MED ADMIN — piperacillin-tazobactam (ZOSYN) 3.375 g in sodium chloride 0.9 % (NS) 100 mL IVPB-MBP: 3.375 g | INTRAVENOUS | @ 23:00:00 | Stop: 2022-11-04

## 2022-10-28 MED ADMIN — escitalopram oxalate (LEXAPRO) tablet 20 mg: 20 mg | ORAL | @ 14:00:00

## 2022-10-28 MED ADMIN — pregabalin (LYRICA) capsule 75 mg: 75 mg | ORAL | @ 14:00:00

## 2022-10-28 MED ADMIN — predniSONE (DELTASONE) tablet 10 mg: 10 mg | ORAL | @ 14:00:00

## 2022-10-28 MED ADMIN — potassium & sodium phosphates 250mg (PHOS-NAK/NEUTRA PHOS) packet 1 packet: 1 | ORAL | @ 16:00:00 | Stop: 2022-10-28

## 2022-10-28 NOTE — Unmapped (Addendum)
NICKHOLAS GOLDSTON [id number]   77 y.o. 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 Gross hematuria overnight.  C/s for evaluation and cystoscopy consideration.  Thanks! Lawanna Kobus [phone] Med KYLEE NARDOZZI [phone]   Still having poor IV access. Consideration for PIV.  Thanks! Lawanna Kobus [phone] Med U     Estimated Creatinine Clearance: 43.3 mL/min (A) (based on SCr of 1.82 mg/dL (H)).

## 2022-10-28 NOTE — Unmapped (Signed)
VASCULAR INTERVENTIONAL RADIOLOGY  Drainage/Aspiration Consultation     Requesting Attending Physician: Kateri Plummer, MD  Service Requesting Consult: Med General Doristine Counter (MDU)    Date of Service: 10/28/2022  Consulting Interventional Radiologist: Dr. Rosalio Loud      HPI:     Reason for Consultation: Targeted liver biopsy     Christopher Brennan is a 77 y.o. 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 CT showed right upper lobe pulmonary mass and numerous hepatic lesions suspicious for metastatic disease.    Patient seen in consultation at the request of primary care team for consideration for US-guided targeted liver biopsy.      Medical History:     Past Medical History:  Past Medical History:   Diagnosis Date    Angina pectoris (CMS-HCC) 06/24/2018    Added automatically from request for surgery [id number]    Anxiety     CAD S/P percutaneous coronary angioplasty     GERD (gastroesophageal reflux disease)     Hypercholesteremia     Hypertension     Myocardial infarction (CMS-HCC)     3 MIs    Panhypopituitarism (CMS-HCC)     Tobacco abuse     Unstable angina (CMS-HCC) 04/05/2018       Surgical History:  Past Surgical History:   Procedure Laterality Date    CARDIAC SURGERY      PR CATH PLACE/CORON ANGIO, IMG SUPER/INTERP,W LEFT HEART VENTRICULOGRAPHY N/A 10/29/2015    Procedure: Left Heart Catheterization W Intervention;  Surgeon: Orpha Bur, MD;  Location: [facility] CATH;  Service: Cardiology    PR CATH PLACE/CORON ANGIO, IMG SUPER/INTERP,W LEFT HEART VENTRICULOGRAPHY N/A 08/01/2016    Procedure: Left Heart Catheterization W Intervention;  Surgeon: Alvira Philips, MD;  Location: [facility] CATH;  Service: Cardiology    PR CATH PLACE/CORON ANGIO, IMG SUPER/INTERP,W LEFT HEART VENTRICULOGRAPHY N/A 03/05/2017    Procedure: Left Heart Catheterization W Intervention;  Surgeon: Alvira Philips, MD;  Location: [facility] CATH;  Service: Cardiology    PR CATH PLACE/CORON ANGIO, IMG SUPER/INTERP,W LEFT HEART VENTRICULOGRAPHY N/A 08/06/2017    Procedure: CATH LEFT HEART CATHETERIZATION W INTERVENTION;  Surgeon: Alvira Philips, MD;  Location: [facility] CATH;  Service: Cardiology    PR CATH PLACE/CORON ANGIO, IMG SUPER/INTERP,W LEFT HEART VENTRICULOGRAPHY N/A 04/09/2018    Procedure: Left Heart Catheterization W Intervention;  Surgeon: Alvira Philips, MD;  Location: [facility] CATH;  Service: Cardiology    PR CATH PLACE/CORON ANGIO, IMG SUPER/INTERP,W LEFT HEART VENTRICULOGRAPHY N/A 07/03/2018    Procedure: CATH LEFT HEART CATHETERIZATION W INTERVENTION;  Surgeon: Alvira Philips, MD;  Location: [facility] CATH;  Service: Cardiology    PR CATH PLACE/CORON ANGIO, IMG SUPER/INTERP,W LEFT HEART VENTRICULOGRAPHY N/A 10/29/2019    Procedure: Left Heart Catheterization;  Surgeon: Alvira Philips, MD;  Location: [facility] CATH;  Service: Cardiology    PR CATH PLACE/CORON ANGIO, IMG SUPER/INTERP,W LEFT HEART VENTRICULOGRAPHY N/A 01/01/2020    Procedure: Left Heart Catheterization W Intervention;  Surgeon: Rosana Hoes, MD;  Location: [facility] CATH;  Service: Cardiology    PR CATH PLACE/CORON ANGIO, IMG SUPER/INTERP,W LEFT HEART VENTRICULOGRAPHY N/A 05/22/2022    Procedure: Left Heart Catheterization;  Surgeon: Alvira Philips, MD;  Location: [facility] CATH;  Service: Cardiology       Family History:  Family History   Problem Relation Age of Onset    Hypertension Father     Cancer  Sister     Hypertension Sister     Cancer Brother     Coronary artery disease Paternal Uncle     Anemia Neg Hx     Diabetes Neg Hx     Kidney disease Neg Hx     Obesity Neg Hx     Thyroid disease Neg Hx     Osteoporosis Neg Hx        Medications:   Current Facility-Administered Medications   Medication Dose Route Frequency Provider Last Rate Last Admin    acetaminophen (TYLENOL) tablet 650 mg  650 mg Oral Q4H PRN Verl Bangs Moral, Lorin Glass, MD aspirin chewable tablet 81 mg  81 mg Oral Daily Miro-Gonzalez, Kaleen Mask, MD   81 mg at 10/28/22 1107    [Provider Hold] clopidogrel (PLAVIX) tablet 75 mg  75 mg Oral Daily Verl Bangs Moral, Lorin Glass, MD        escitalopram oxalate (LEXAPRO) tablet 20 mg  20 mg Oral Daily Verl Bangs Moral, Lorin Glass, MD   20 mg at 10/28/22 4540    ezetimibe (ZETIA) tablet 20 mg  20 mg Oral Daily Verl Bangs Moral, Lorin Glass, MD   20 mg at 10/28/22 9811    finasteride (PROSCAR) tablet 5 mg  5 mg Oral Daily Verl Bangs Moral, Lorin Glass, MD   5 mg at 10/28/22 9147    levothyroxine (SYNTHROID) tablet 100 mcg  100 mcg Oral daily Verl Bangs Moral, Lorin Glass, MD   100 mcg at 10/28/22 0526    mirtazapine (REMERON) tablet 22.5 mg  22.5 mg Oral Nightly Verl Bangs Moral, Lorin Glass, MD        [Provider Hold] mycophenolate (CELLCEPT) capsule 1,000 mg  1,000 mg Oral BID Verl Bangs Moral, Lorin Glass, MD   500 mg at 10/28/22 8295    pantoprazole (Protonix) EC tablet 40 mg  40 mg Oral Daily Verl Bangs Moral, Lorin Glass, MD   40 mg at 10/28/22 0923    piperacillin-tazobactam (ZOSYN) 3.375 g in sodium chloride 0.9 % (NS) 100 mL IVPB-MBP  3.375 g Intravenous Q6H SCH Miro-Gonzalez, Kaleen Mask, MD   Stopped at 10/28/22 1228    potassium & sodium phosphates 250mg  (PHOS-NAK/NEUTRA PHOS) packet 1 packet  1 packet Oral BID Sharlyn Bologna, MD   1 packet at 10/28/22 1107    predniSONE (DELTASONE) tablet 10 mg  10 mg Oral Daily Verl Bangs Moral, Lorin Glass, MD   10 mg at 10/28/22 0923    pregabalin (LYRICA) capsule 75 mg  75 mg Oral Daily Verl Bangs Moral, Lorin Glass, MD   75 mg at 10/28/22 6213    tamsulosin (FLOMAX) 24 hr capsule 0.4 mg  0.4 mg Oral Daily Verl Bangs Moral, Lorin Glass, MD   0.4 mg at 10/28/22 0865       Allergies:  Statins-hmg-coa reductase inhibitors and Doxycycline    Social History:  Social History     Tobacco Use    Smoking status: Every Day     Current packs/day: 1.50     Average packs/day: 1.5 packs/day for 60.0 years (90.0 ttl pk-yrs)     Types: Cigarettes    Smokeless tobacco: Former    Tobacco comments:     1-1.5ppd. Aims to reduce use.   Vaping Use    Vaping status: Never Used   Substance Use Topics    Alcohol use: No     Alcohol/week: 0.0 standard drinks of alcohol    Drug use: Not Currently  Objective:    Pertinent Laboratory Values:  WBC   Date Value Ref Range Status   10/28/2022 8.3 3.6 - 11.2 10*9/L Final   03/19/2012 9.3 4.5 - 11.0 x10 9th/L Final     HGB   Date Value Ref Range Status   10/28/2022 16.1 12.9 - 16.5 g/dL Final   45/40/9811 91.4 13.5 - 17.5 G/DL Final     HCT   Date Value Ref Range Status   10/28/2022 49.0 (H) 39.0 - 48.0 % Final   03/19/2012 45.5 41.0 - 53.0 % Final     Platelet   Date Value Ref Range Status   10/28/2022 107 (L) 150 - 450 10*9/L Final   03/19/2012 182 150 - 440 x10 9th/L Final     INR   Date Value Ref Range Status   10/28/2022 1.07  Final   03/18/2012 0.9  Final     Creatinine   Date Value Ref Range Status   10/28/2022 2.09 (H) 0.73 - 1.18 mg/dL Final   78/29/5621 3.08 (H) 0.70 - 1.30 MG/DL Final       Imaging Reviewed:  10/27/2022 CT chest/abdomen/pelvis    Febrile:  No    Physical Exam:    Vitals:    10/28/22 0730   BP: 118/75   Pulse: 72   Resp: 18   Temp: 36.4 ??C (97.5 ??F)   SpO2: 96%       General: No apparent distress.  Lungs: Breathing comfortably on RA.  Heart:  Regular rate and rhythm.  Neuro: No obvious focal deficits.    ASA Grade: ASA 3 - Patient with moderate systemic disease with functional limitations  Airway assessment: Class 3 - Can visualize soft palate    Assessment and Recommendations:     Christopher Brennan is a 77 y.o. male who presented in shock with right upper lobe pulmonary mass and numerous hepatic lesions suspicious for metastatic disease.    Recommendations:   - Pending schedule availability, VIR is ok with proceeding with US-guided targeted liver biopsy when the patient is more stable and there is adequate AC medication hold.    - Anticipated procedure date: 3/13 or 3/14 pending VIR scheduling. If possible, consider outpatient timing as well.   - Anticoagulation: Yes  If yes, type of anticoagulation: Eliquis, Plavix, ASA (81 mg), heparin gtt  -Anticoagulant medication hold required: Yes.      *HOLD HEPARIN 4 HOURS PRIOR TO PROCEDURE   *OK TO CONTINUE ASA 81 MG   *HOLD PLAVIX FOR 5 DAYS PRIOR TO PROCEDURE  *HOLD 6 DOSES OF ELIQUIS PRIOR TO PROCEDURE    -Anticoagulation can be resumed:  Can resume heparin gtt 6 hrs after procedure  - Please make NPO night prior to procedure  - Please ensure recent CBC, Creatinine, and INR are available    Informed Consent:  This procedure and sedation has been fully reviewed with the patient/patient???s authorized representative. The risks, benefits and alternatives including but not limited to bleeding, infection, and damage to adjacent structures have been explained, and the patient/patient???s authorized representative has consented to the procedure. Consent obtained by Konrad Dolores, MD, witnessed and scanned into patient's medical record.   --The patient will accept blood products in an emergent situation.  --The patient does not have a Do Not Resuscitate order in effect.    The patient was discussed with Dr. Rosalio Loud .     Thank you for involving [place] in the care of this patient. Please page the VIR consult  pager [phone]) with further questions, concerns, or if new issues arise.  Marland Kitchen

## 2022-10-28 NOTE — Unmapped (Signed)
Transferred from [facility] @ 0400,alert and oriented,vitals stable.Able to ambulate using cane and standby assist.Skin checked done with Gertie Baron.Has a big skin tear to RUA and scattered bruising.On Enteric isolation to r/o Gi pathogen,negative for c diff.Oriented to room,call bell,plan of care.Verbalized understanding but needs reinforcement.Resting quietly in bed.Will cont.to monitor.    Problem: Adult Inpatient Plan of Care  Goal: Plan of Care Review  Outcome: Progressing  Goal: Patient-Specific Goal (Individualized)  Outcome: Progressing  Flowsheets (Taken 10/28/2022 0653)  Patient/Family-Specific Goals (Include Timeframe): Pt.will have no falls/injuries this shift.  Individualized Care Needs: Monitor vitals/labs.,falls prec.,wopund care  Anxieties, Fears or Concerns: None voiced  Goal: Absence of Hospital-Acquired Illness or Injury  Outcome: Progressing  Intervention: Identify and Manage Fall Risk  Recent Flowsheet Documentation  Taken 10/28/2022 0400 by Debara Pickett, RN  Safety Interventions:   isolation precautions   fall reduction program maintained   environmental modification   lighting adjusted for tasks/safety   nonskid shoes/slippers when out of bed   room near unit station  Goal: Optimal Comfort and Wellbeing  Outcome: Progressing  Goal: Readiness for Transition of Care  Outcome: Progressing  Goal: Rounds/Family Conference  Outcome: Progressing     Problem: Infection  Goal: Absence of Infection Signs and Symptoms  Outcome: Progressing     Problem: Self-Care Deficit  Goal: Improved Ability to Complete Activities of Daily Living  Outcome: Progressing     Problem: VTE (Venous Thromboembolism)  Goal: Tissue Perfusion  Outcome: Progressing  Goal: Right Ventricular Function  Outcome: Progressing

## 2022-10-28 NOTE — Unmapped (Signed)
Internal Medicine (MEDU) History & Physical    Assessment & Plan:   Christopher Brennan is a 77 y.o. 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 *    Active Problems  Shock - Hypovolemic vs Septic   Patient received antibiotics in ED but given history unclear for infection. C diff negative. Pending GIPP. No ill contact. Elevated lactate most likely in setting of hypovolemia from ongoing GI loses. Most likely in setting of new malignancy found on CT scan causing transaminitis, as noted below.  - Follow Bcx, Ucx  - Follow GIPP  - s/p IV Zosyn in ED  - s/p IVF    Transaminitis - Liver Masses c/f metastatic disease  Patient with transaminitis with CT findings concerning for metastatic disease. Showing masses throughout liver and spleen with enlarged periportal lymph nodes. Gallbladder normal and no ductal dilation. Low concern for stone causing symptoms. Possibly in setting of malignancy.   - Consult VIR for biopsy  - Trend hepatic function  - PT-INR    Upper Lobe Mass R Lung  Patient with known tobacco use history. 1ppd since teenager. CT [place] 4.3 cm posterior RUL mass with multiple nodules. Unclear if this could be primary site.  - Consult VIR for biopsy of possible lung site as above    Irregular Thickening of Bladder Wall   Concerning for another possible source of malignancy, malignancy as bladder cancer can spread to liver and lung.  - Biopsy as above  - Consider urology consult pending above    Aortic Aneurysm 4.6 cm - Dissection Common Illiac Artery  Was seen in cardiology clinic 2/8 and had CTA done showing increased abdominal aortic aneurysm up to 4.6 cm and new short segment dissection flap in R common iliac artery, with unchanged dissection in L common iliac artery.   - Consider Vascular consultation    AKI on CKD 3b (GFR 30-45): Patients creatinine 2.06 on admission up from a baseline of 1.7. Most likely etiology is pre-renal based on history as above with GI losses and low appetite.   -Daily BMP  -UA with reflex to culture  -Post void residual x1 to evaluate for obstruction  -Renally dose meds  -Avoid nephrotoxins (including NSAIDs, contrasted studies, fleets enemas)    Chronic Problems  CAD s/p PCI to distal LAD and OM1 in 05/2022: On Plavix alongside eliquis as below. on zetia, evolocumab  DVT hx: On eliquis, consider heparin drip in preparation for possible biopsy  CIDP: On Cellcept with IVIG  Restless leg: Lyrica 150 or 75 mg, unclear dosing.   MGUS: Follows  with Dr. Barbette Merino.     The patient's presentation is complicated by the following clinically significant conditions requiring additional evaluation and treatment: - Hypercoagulable state requiring additional attention to DVT prophylaxis and treatment  - Chronic kidney disease POA requiring further investigation, treatment, or monitoring   - Volume depletion POA requiring further investigation, treatment, or monitoring  - Metastatic cancer POA requiring further investigation, treatment, or monitoring       Issues Impacting Complexity of Management:  -Discussion of risks/benefits of biopsy of lung or liver  with the patient including risk of risk factor: hemorrhage, infection, and medical complications.      Medical Decision Making: Reviewed records from the following unique sources  outpatient heme/onc, neurology, cardiology visit.      Checklist:  Diet: Regular Diet  DVT PPx:  on eliquis, holding in setting of planning biopsy  Code Status: Full Code  Dispo: Patient appropriate for Inpatient based on expectation of ongoing need for hospitalization greater than two midnights based on severity of presentation/services including AKI, new malignancy concerns, diarrhea unable to tolerate PO    Team Contact Information:   Primary Team: Internal Medicine (MEDU)  Primary Resident: Doroteo Glassman del Moral, MD  Resident's Pager: [phone] (Gen MedU Intern - Tower)    Chief Concern:   Diarrhea    Subjective:   Christopher Brennan is a 77 y.o. 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       History obtained by patient.     HPI:  Patient presented to [facility] for ongoing generalized weakness, lightheadedness, hypotension, diarrhea, poor appetite for 3 weeks. Patient states that before 3 weeks ago he was his normal self. Has been having black loose stools 5-6 stools a day. Has been loose. Ongoing bowel movements. Has not gotten better, nor worse since starting. Also with lower, sharp abdominal pain. Waxing and waning. No fever, chills, vomiting. No ill contact.       Pertinent Surgical Hx  Past Surgical History:   Procedure Laterality Date    CARDIAC SURGERY      PR CATH PLACE/CORON ANGIO, IMG SUPER/INTERP,W LEFT HEART VENTRICULOGRAPHY N/A 10/29/2015    Procedure: Left Heart Catheterization W Intervention;  Surgeon: Orpha Bur, MD;  Location: [facility] CATH;  Service: Cardiology    PR CATH PLACE/CORON ANGIO, IMG SUPER/INTERP,W LEFT HEART VENTRICULOGRAPHY N/A 08/01/2016    Procedure: Left Heart Catheterization W Intervention;  Surgeon: Alvira Philips, MD;  Location: [facility] CATH;  Service: Cardiology    PR CATH PLACE/CORON ANGIO, IMG SUPER/INTERP,W LEFT HEART VENTRICULOGRAPHY N/A 03/05/2017    Procedure: Left Heart Catheterization W Intervention;  Surgeon: Alvira Philips, MD;  Location: [facility] CATH;  Service: Cardiology    PR CATH PLACE/CORON ANGIO, IMG SUPER/INTERP,W LEFT HEART VENTRICULOGRAPHY N/A 08/06/2017    Procedure: CATH LEFT HEART CATHETERIZATION W INTERVENTION;  Surgeon: Alvira Philips, MD;  Location: [facility] CATH;  Service: Cardiology    PR CATH PLACE/CORON ANGIO, IMG SUPER/INTERP,W LEFT HEART VENTRICULOGRAPHY N/A 04/09/2018    Procedure: Left Heart Catheterization W Intervention;  Surgeon: Alvira Philips, MD;  Location: [facility] CATH;  Service: Cardiology    PR CATH PLACE/CORON ANGIO, IMG SUPER/INTERP,W LEFT HEART VENTRICULOGRAPHY N/A 07/03/2018    Procedure: CATH LEFT HEART CATHETERIZATION W INTERVENTION;  Surgeon: Alvira Philips, MD;  Location: [facility] CATH;  Service: Cardiology    PR CATH PLACE/CORON ANGIO,  IMG SUPER/INTERP,W LEFT HEART VENTRICULOGRAPHY N/A 10/29/2019    Procedure: Left Heart Catheterization;  Surgeon: Alvira Philips, MD;  Location: [facility] CATH;  Service: Cardiology    PR CATH PLACE/CORON ANGIO, IMG SUPER/INTERP,W LEFT HEART VENTRICULOGRAPHY N/A 01/01/2020    Procedure: Left Heart Catheterization W Intervention;  Surgeon: Rosana Hoes, MD;  Location: [facility] CATH;  Service: Cardiology    PR CATH PLACE/CORON ANGIO, IMG SUPER/INTERP,W LEFT HEART VENTRICULOGRAPHY N/A 05/22/2022    Procedure: Left Heart Catheterization;  Surgeon: Alvira Philips, MD;  Location: [facility] CATH;  Service: Cardiology         Pertinent Family Hx  None    Pertinent Social Hx   Lives in home alone, has family nearby. Grandchildren visit regularly. 1PPD smoker, has been smoking since teenage years. No alcohol use. No other substance use.     Allergies  Statins-hmg-coa reductase inhibitors and Doxycycline    I was NOT able to review the Medication List with the patient or a representative. Further medication reconciliation is needed. Reviewed via epic.   Prior to Admission medications    Medication Dose, Route, Frequency   amLODIPine (NORVASC) 5 MG tablet 5 mg, Oral, Daily (standard)   carvedilol (COREG) 6.25 MG tablet 12.5 mg, Oral, 2 times a day (standard)   clopidogrel (PLAVIX) 75 mg tablet 75 mg, Oral, Daily (standard), Frequency:QD   Dosage:75   MG  Instructions:  Note:Dose: 75MG    cyanocobalamin, vitamin B-12, 1,000 mcg Subl 1 tablet, Oral, Daily   ELIQUIS 2.5 mg Tab TAKE 1 TABLET BY MOUTH TWICE DAILY   empty container (SHARPS-A-GATOR DISPOSAL SYSTEM) Misc Use as directed for sharps disposal   escitalopram oxalate (LEXAPRO) 10 MG tablet 20 mg, Oral, Daily (standard)   evolocumab 140 mg/mL PnIj Inject the contents of one pen (140 mg) under the skin every fourteen (14) days.   ezetimibe (ZETIA) 10 mg tablet 20 mg, Oral, Daily (standard)   finasteride (PROSCAR) 5 mg tablet 5 mg, Oral, Daily (standard)   levothyroxine (SYNTHROID, LEVOTHROID) 100 MCG tablet 100 mcg, Oral, Daily (standard)   LORazepam (ATIVAN) 0.5 MG tablet 0.5 mg, Oral, 2 times a day (standard), Can take extra prn   LYRICA 150 mg capsule 1 capsule, Oral, Daily (standard)   mirtazapine (REMERON) 15 MG tablet 22.5 mg, Oral, Nightly, Pt is taking 1.5 tablet.   mycophenolate (CELLCEPT) 250 mg capsule Take 1 capsule (250 mg total) by mouth two (2) times a day for 30 days, THEN 2 capsules (500 mg total) two (2) times a day for 30 days, THEN 4 capsules (1,000 mg total) two (2) times a day.   nitroglycerin (NITROSTAT) 0.4 MG SL tablet 0.4 mg, Sublingual, Every 5 min PRN   pantoprazole (PROTONIX) 40 MG tablet 40 mg, Oral, Daily (standard)   predniSONE (DELTASONE) 10 MG tablet 1 tablet, Oral, Daily (standard)   tamsulosin (FLOMAX) 0.4 mg capsule 0.4 mg, Oral, Daily (standard), Frequency:QD   Dosage:0.4   MG  Instructions: Note:Dose: 0.4MG    testosterone cypionate (DEPOTESTOTERONE CYPIONATE) 200 mg/mL injection 150 mg, Intramuscular, Every 14 days       Librarian, academic:  Christopher Brennan currently has decisional capacity for healthcare decision-making and is able to designate a surrogate healthcare decision maker. Christopher Brennan designated healthcare decision maker(s) is/are Lubertha Basque (the patient's adult child) as denoted by stated patient preference.    Objective:   Physical Exam:  Temp:  [36 ??C (96.8 ??F)-36.6 ??C (97.9 ??F)] 36.6 ??C (97.9 ??F)  Heart Rate:  [  42-65] 59  SpO2 Pulse:  [57] 57  Resp:  [10-20] 18  BP: (92-158)/(63-104) 143/72  SpO2:  [90 %-98 %] 96 %    Gen: NAD, converses   Eyes: Sclera anicteric, EOMI grossly normal   HENT: Atraumatic, normocephalic  Neck: Trachea midline  Heart: RRR  Lungs: CTAB, no crackles or wheezes  Abdomen: Soft, tenderness epigastrum and LLQ.   Extremities: No edema  Neuro: Grossly symmetric, non-focal    Skin:  scattered crustly skin lesions, known skin cancer per patient  Psych: Alert, oriented x4

## 2022-10-28 NOTE — Unmapped (Signed)
Patient is alert and oriented. He is cooperative with care. He c/o indigestion. Gave him TUMs that helped . He is anxious at times. Gave him Ativan 0.5mg  scheduled . Family at the bed side. He will be getting iv antibiotics. Will start Heparin ACS , monitoring per nomogram. He walked around the unit using a cane , accompanied by his family. No falls. Will continue with plan of care .  Problem: Adult Inpatient Plan of Care  Goal: Plan of Care Review  Outcome: Progressing  Flowsheets (Taken 10/28/2022 1537)  Progress: improving  Plan of Care Reviewed With: patient  Goal: Patient-Specific Goal (Individualized)  Outcome: Progressing  Flowsheets (Taken 10/28/2022 1537)  Patient/Family-Specific Goals (Include Timeframe): Pt.will have no falls/injuries this shift.  Individualized Care Needs: Monitor vitals/labs.,falls prec.,wopund care  Anxieties, Fears or Concerns: Noine voiced.  Goal: Absence of Hospital-Acquired Illness or Injury  Outcome: Progressing  Intervention: Identify and Manage Fall Risk  Flowsheets  Taken 10/28/2022 1537  Safety Interventions: fall reduction program maintained  Taken 10/28/2022 0800  Safety Interventions:   fall reduction program maintained   bed alarm  Intervention: Prevent and Manage VTE (Venous Thromboembolism) Risk  Flowsheets  Taken 10/28/2022 1537  VTE Prevention/Management: ambulation promoted  Taken 10/28/2022 1400  Anti-Embolism Intervention: (Plavix on hold) Other (Comment)  Taken 10/28/2022 1200  Anti-Embolism Intervention: (Plavix on hold) Other (Comment)  Taken 10/28/2022 1000  Anti-Embolism Intervention: (Plavix on hold) Other (Comment)  Taken 10/28/2022 0800  Anti-Embolism Intervention: (Plavix on hold) Other (Comment)  Goal: Optimal Comfort and Wellbeing  Outcome: Progressing  Goal: Readiness for Transition of Care  Outcome: Progressing  Intervention: Mutually Develop Transition Plan  Flowsheets (Taken 10/28/2022 1537)  Concerns to be Addressed: discharge planning  Goal: Rounds/Family Conference  Outcome: Progressing  Flowsheets (Taken 10/28/2022 1537)  Participants:   nursing   physician     Problem: Infection  Goal: Absence of Infection Signs and Symptoms  Outcome: Progressing     Problem: Self-Care Deficit  Goal: Improved Ability to Complete Activities of Daily Living  Outcome: Progressing  Intervention: Promote Activity and Functional Independence  Flowsheets (Taken 10/28/2022 1537)  Activity Assistance Provided: assistance, 1 person     Problem: VTE (Venous Thromboembolism)  Goal: Tissue Perfusion  Outcome: Progressing  Intervention: Optimize Tissue Perfusion  Flowsheets  Taken 10/28/2022 1537  VTE Prevention/Management: ambulation promoted  Taken 10/28/2022 1400  Anti-Embolism Intervention: (Plavix on hold) Other (Comment)  Taken 10/28/2022 1200  Anti-Embolism Intervention: (Plavix on hold) Other (Comment)  Taken 10/28/2022 1000  Anti-Embolism Intervention: (Plavix on hold) Other (Comment)  Taken 10/28/2022 0800  Anti-Embolism Intervention: (Plavix on hold) Other (Comment)  Goal: Right Ventricular Function  Outcome: Progressing     Problem: Fall Injury Risk  Goal: Absence of Fall and Fall-Related Injury  Outcome: Progressing  Intervention: Promote Injury-Free Environment  Recent Flowsheet Documentation  Taken 10/28/2022 1537 by Cicero Duck, RN  Safety Interventions: fall reduction program maintained  Taken 10/28/2022 0800 by Cicero Duck, RN  Safety Interventions:   fall reduction program maintained   bed alarm

## 2022-10-28 NOTE — Unmapped (Signed)
Cardiology Treatment Plan Note    Called about Mr. Christopher Brennan, 77 y.o. M with PMH of panhypopituitarism secondary to head trauma, currently on hormone replacement therapy, hypertension, hyperlipidemia and coronary artery disease, status post multiple PCIs (most recent on 05/2022 with PCI to distal LAD and OM1, 01/01/20 with DES to LAD, 10/29/19 with balloon angioplasty of LAD instent restenosis, 07/03/18 DES to dRCA, 04/09/18 with PCI to p RCA and balloon angioplasty to dRCA), who presented to [facility] yesterday with poor po intake, diarrhea, weakness. Found to have RUL pulmonary mass and numerous hepatic lesions suspicious for metastatic disease.     VIR consulted, recommended liver biopsy. Cardiology called regarding antiplatelet recommendations. Discussed with Dr. Jaymes Graff, it is ok to hold plavix and eliquis for time required by VIR prior to liver biopsy. Agree with substitution to ASA and heparin gtt. We would resume plavix as soon as feasible after the procedure.     Durward Parcel, MD  [facility] Cardiology PGY-6

## 2022-10-29 LAB — BASIC METABOLIC PANEL
ANION GAP: 8 mmol/L (ref 5–14)
BLOOD UREA NITROGEN: 37 mg/dL — ABNORMAL HIGH (ref 9–23)
BUN / CREAT RATIO: 15
CALCIUM: 8.1 mg/dL — ABNORMAL LOW (ref 8.7–10.4)
CHLORIDE: 113 mmol/L — ABNORMAL HIGH (ref 98–107)
CO2: 19 mmol/L — ABNORMAL LOW (ref 20.0–31.0)
CREATININE: 2.41 mg/dL — ABNORMAL HIGH
EGFR CKD-EPI (2021) MALE: 27 mL/min/{1.73_m2} — ABNORMAL LOW (ref >=60)
GLUCOSE RANDOM: 64 mg/dL — ABNORMAL LOW (ref 70–179)
POTASSIUM: 4.3 mmol/L (ref 3.4–4.8)
SODIUM: 140 mmol/L (ref 135–145)

## 2022-10-29 LAB — HEPATIC FUNCTION PANEL
ALBUMIN: 2.5 g/dL — ABNORMAL LOW (ref 3.4–5.0)
ALKALINE PHOSPHATASE: 722 U/L — ABNORMAL HIGH (ref 46–116)
ALT (SGPT): 138 U/L — ABNORMAL HIGH (ref 10–49)
AST (SGOT): 217 U/L — ABNORMAL HIGH (ref <=34)
BILIRUBIN DIRECT: 1.8 mg/dL — ABNORMAL HIGH (ref 0.00–0.30)
BILIRUBIN TOTAL: 2.3 mg/dL — ABNORMAL HIGH (ref 0.3–1.2)
PROTEIN TOTAL: 5.7 g/dL (ref 5.7–8.2)

## 2022-10-29 LAB — PHOSPHORUS: PHOSPHORUS: 3 mg/dL (ref 2.4–5.1)

## 2022-10-29 LAB — CBC
HEMATOCRIT: 46.8 % (ref 39.0–48.0)
HEMOGLOBIN: 15.3 g/dL (ref 12.9–16.5)
MEAN CORPUSCULAR HEMOGLOBIN CONC: 32.6 g/dL (ref 32.0–36.0)
MEAN CORPUSCULAR HEMOGLOBIN: 27.1 pg (ref 25.9–32.4)
MEAN CORPUSCULAR VOLUME: 83.1 fL (ref 77.6–95.7)
MEAN PLATELET VOLUME: 10.1 fL (ref 6.8–10.7)
PLATELET COUNT: 93 10*9/L — ABNORMAL LOW (ref 150–450)
RED BLOOD CELL COUNT: 5.64 10*12/L — ABNORMAL HIGH (ref 4.26–5.60)
RED CELL DISTRIBUTION WIDTH: 21.3 % — ABNORMAL HIGH (ref 12.2–15.2)
WBC ADJUSTED: 7.6 10*9/L (ref 3.6–11.2)

## 2022-10-29 LAB — APTT
APTT: 90.6 s — ABNORMAL HIGH (ref 24.8–38.4)
HEPARIN CORRELATION: 0.5

## 2022-10-29 LAB — PROTIME-INR
INR: 1.2
PROTIME: 13.4 s — ABNORMAL HIGH (ref 9.9–12.6)

## 2022-10-29 LAB — PERIPHERAL BLOOD SMEAR, PATH REVIEW

## 2022-10-29 LAB — FOLATE: FOLATE: 15.1 ng/mL (ref >=5.4)

## 2022-10-29 LAB — VITAMIN B12: VITAMIN B-12: 2918 pg/mL — ABNORMAL HIGH (ref 211–911)

## 2022-10-29 LAB — MAGNESIUM: MAGNESIUM: 2.2 mg/dL (ref 1.6–2.6)

## 2022-10-29 MED ADMIN — mirtazapine (REMERON) tablet 22.5 mg: 22.5 mg | ORAL | @ 02:00:00

## 2022-10-29 MED ADMIN — heparin 25,000 Units/250 mL (100 units/mL) in 0.45% saline infusion (premade): 0-24 [IU]/kg/h | INTRAVENOUS | @ 19:00:00

## 2022-10-29 MED ADMIN — tamsulosin (FLOMAX) 24 hr capsule 0.4 mg: .4 mg | ORAL | @ 13:00:00

## 2022-10-29 MED ADMIN — pantoprazole (Protonix) EC tablet 40 mg: 40 mg | ORAL | @ 13:00:00

## 2022-10-29 MED ADMIN — potassium & sodium phosphates 250mg (PHOS-NAK/NEUTRA PHOS) packet 1 packet: 1 | ORAL | @ 02:00:00 | Stop: 2022-10-28

## 2022-10-29 MED ADMIN — piperacillin-tazobactam (ZOSYN) 3.375 g in sodium chloride 0.9 % (NS) 100 mL IVPB-MBP: 3.375 g | INTRAVENOUS | @ 11:00:00 | Stop: 2022-10-29

## 2022-10-29 MED ADMIN — pregabalin (LYRICA) capsule 75 mg: 75 mg | ORAL | @ 13:00:00

## 2022-10-29 MED ADMIN — levothyroxine (SYNTHROID) tablet 100 mcg: 100 ug | ORAL | @ 11:00:00

## 2022-10-29 MED ADMIN — escitalopram oxalate (LEXAPRO) tablet 20 mg: 20 mg | ORAL | @ 13:00:00

## 2022-10-29 MED ADMIN — predniSONE (DELTASONE) tablet 10 mg: 10 mg | ORAL | @ 13:00:00

## 2022-10-29 MED ADMIN — lactated ringers bolus 1,000 mL: 1000 mL | INTRAVENOUS | @ 19:00:00 | Stop: 2022-10-29

## 2022-10-29 MED ADMIN — piperacillin-tazobactam (ZOSYN) 3.375 g in sodium chloride 0.9 % (NS) 100 mL IVPB-MBP: 3.375 g | INTRAVENOUS | @ 06:00:00 | Stop: 2022-10-29

## 2022-10-29 MED ADMIN — aspirin chewable tablet 81 mg: 81 mg | ORAL | @ 13:00:00

## 2022-10-29 MED ADMIN — ezetimibe (ZETIA) tablet 20 mg: 20 mg | ORAL | @ 13:00:00

## 2022-10-29 MED ADMIN — LORazepam (ATIVAN) tablet 0.5 mg: .5 mg | ORAL | @ 13:00:00

## 2022-10-29 MED ADMIN — LORazepam (ATIVAN) tablet 0.5 mg: .5 mg | ORAL | @ 22:00:00

## 2022-10-29 MED ADMIN — finasteride (PROSCAR) tablet 5 mg: 5 mg | ORAL | @ 13:00:00

## 2022-10-29 NOTE — Unmapped (Signed)
The Venous Access Team (VAT) received a request for PIV access.   VAT assessed patient at bedside.  Appropriate PPE were utilized. Patient has bruised bilateral upper extremities and infiltrates noted. Poor vasculature noted. MED U called to bedside to observe. Kidney function rules patient out for PICC placement.  MED U  deferred to medicine procedural team.        PIV Workup / Procedure Time:  15 minutes    Care RN was notified.     Thank you,     Markis Langland Jean Rosenthal, RN Venous Access Team

## 2022-10-29 NOTE — Unmapped (Signed)
Problem: Adult Inpatient Plan of Care  Goal: Plan of Care Review  Outcome: Ongoing - Unchanged  Goal: Patient-Specific Goal (Individualized)  Outcome: Ongoing - Unchanged  Goal: Absence of Hospital-Acquired Illness or Injury  Outcome: Ongoing - Unchanged  Intervention: Prevent and Manage VTE (Venous Thromboembolism) Risk  Recent Flowsheet Documentation  Taken 10/29/2022 0400 by Governor Rooks, RN  Anti-Embolism Intervention: (HEPARIN GTT) Other (Comment)  Taken 10/29/2022 0159 by Governor Rooks, RN  Anti-Embolism Intervention: (HEPARIN GTT) Other (Comment)  Taken 10/29/2022 0000 by Governor Rooks, RN  Anti-Embolism Intervention: (HEPARIN GTT) Other (Comment)  Goal: Optimal Comfort and Wellbeing  Outcome: Ongoing - Unchanged  Goal: Readiness for Transition of Care  Outcome: Ongoing - Unchanged  Goal: Rounds/Family Conference  Outcome: Ongoing - Unchanged     Problem: Infection  Goal: Absence of Infection Signs and Symptoms  Outcome: Ongoing - Unchanged     Problem: Self-Care Deficit  Goal: Improved Ability to Complete Activities of Daily Living  Outcome: Ongoing - Unchanged     Problem: VTE (Venous Thromboembolism)  Goal: Tissue Perfusion  Outcome: Ongoing - Unchanged  Intervention: Optimize Tissue Perfusion  Recent Flowsheet Documentation  Taken 10/29/2022 0400 by Governor Rooks, RN  Anti-Embolism Intervention: (HEPARIN GTT) Other (Comment)  Taken 10/29/2022 0159 by Governor Rooks, RN  Anti-Embolism Intervention: (HEPARIN GTT) Other (Comment)  Taken 10/29/2022 0000 by Governor Rooks, RN  Anti-Embolism Intervention: (HEPARIN GTT) Other (Comment)  Goal: Right Ventricular Function  Outcome: Ongoing - Unchanged     Problem: Fall Injury Risk  Goal: Absence of Fall and Fall-Related Injury  Outcome: Ongoing - Unchanged

## 2022-10-29 NOTE — Unmapped (Signed)
Internal Medicine (MEDU) Progress Note    Assessment & Plan:   Christopher Brennan is a 77 y.o. 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 *        Active Problems    Elevated Lactate - Hypovolemic vs Septic   Patient received antibiotics in ED but given history unclear for infection. C diff negative. Pending GIPP. No ill contact. Elevated lactate most likely in setting of hypovolemia from ongoing GI loses. Urinalysis also notable for pyuria with moderate LE, concerning for UTI and given above context, could be concerning for urosepsis.   - Follow Bcx, Ucx  - Follow GIPP  - Continue IV Zosyn pending cultures      Transaminitis - Liver Masses c/f metastatic disease  Patient with transaminitis with CT findings concerning for metastatic disease. Showing masses throughout liver and spleen with enlarged periportal lymph nodes. Gallbladder normal and no ductal dilation. Low concern for stone causing symptoms. Possibly in setting of malignancy. Currently concerned about liver metastasis from unknown primary, although likely pulmonary primary in the setting of the lung mass as below.  - Consulted VIR for biopsy, tentatively occurring on 3/13-3/14  - Consulted cardiology per VIR request for management of periprocedural management of clopidogrel/apixaban   - Agree with ASA 81 mg and heparin gtt while inpatient     based on need for liver biopsy  - Resume plavix as soon as feasible post-procedure  - Trend hepatic function  - PT-INR     Upper Lobe Mass R Lung  Patient with known tobacco use history. 1ppd since teenager. CT scan showing 4.3 cm posterior RUL mass with multiple nodules. Unclear if this could be primary site.  - Consult VIR for biopsy of liver lesions as above     Irregular Thickening of Bladder Wall   Concerning for another possible source of malignancy, malignancy as bladder cancer can spread to liver and lung.  - Biopsy as above  - Consider urology consult pending above     Aortic Aneurysm 4.6 cm - Dissection Common Illiac Artery  Was seen in cardiology clinic 2/8 and had CTA done showing increased abdominal aortic aneurysm up to 4.6 cm and new short segment dissection flap in R common iliac artery, with unchanged dissection in L common iliac artery.   - Consider Vascular consultation     AKI on CKD 3b (GFR 30-45): Patients creatinine 2.06 on admission up from a baseline of 1.7. Most likely etiology is pre-renal based on history as above with GI losses and low appetite.   -Daily BMP  -UA with  reflex to culture  -Post void residual x1 to evaluate for obstruction  -Renally dose meds  -Avoid nephrotoxins (including NSAIDs, contrasted studies, fleets enemas)     Chronic Problems  CAD s/p PCI to distal LAD and OM1 in 05/2022: On Plavix alongside eliquis as below. on zetia, evolocumab  DVT hx: On eliquis at home, heparin drip in preparation for possible biopsy  CIDP: Holding Cellcept given concern for infection at this time, also treated with IVIG in the outpatient setting  Restless leg: Lyrica 150 or 75 mg, unclear dosing.   MGUS: Follows with Dr. Barbette Merino.         Issues Impacting Complexity of Management:  The patient's presentation is complicated by the following clinically significant conditions requiring additional evaluation and treatment: - Hypercoagulable state requiring additional attention to DVT prophylaxis and treatment  - Chronic kidney disease POA requiring further investigation, treatment, or monitoring   - Volume depletion POA requiring further investigation, treatment, or monitoring  - Metastatic cancer POA requiring further investigation, treatment, or monitoring         Issues Impacting Complexity of Management:  -Discussion of risks/benefits of biopsy of lung or liver  with the patient including risk of risk factor: hemorrhage, infection, and medical complications.        Medical Decision Making: Reviewed records from the following unique sources  outpatient heme/onc, neurology, cardiology visit.      Daily Checklist:  Diet: Regular Diet  DVT PPx:  Heparin gtt while admitted  Code Status: Full Code  Dispo:  Continue current level of care    Team Contact Information:   Primary Team: Internal Medicine (MEDU)  Primary Resident: Sharlyn Bologna, MD, MD  Resident's Pager: [phone] (Gen MedU Intern - Tower)

## 2022-10-29 NOTE — Unmapped (Signed)
Pt alert and oriented. Breathing even and unlabored on room air. Fall precautions in place. Assist x1 with cane. Belongings and call light in place.   Problem: Adult Inpatient Plan of Care  Goal: Plan of Care Review  Outcome: Progressing  Goal: Patient-Specific Goal (Individualized)  Outcome: Progressing  Goal: Absence of Hospital-Acquired Illness or Injury  Outcome: Progressing  Goal: Optimal Comfort and Wellbeing  Outcome: Progressing  Goal: Readiness for Transition of Care  Outcome: Progressing  Goal: Rounds/Family Conference  Outcome: Progressing     Problem: Infection  Goal: Absence of Infection Signs and Symptoms  Outcome: Progressing     Problem: Self-Care Deficit  Goal: Improved Ability to Complete Activities of Daily Living  Outcome: Progressing     Problem: VTE (Venous Thromboembolism)  Goal: Tissue Perfusion  Outcome: Progressing  Goal: Right Ventricular Function  Outcome: Progressing     Problem: Fall Injury Risk  Goal: Absence of Fall and Fall-Related Injury  Outcome: Progressing

## 2022-10-29 NOTE — Unmapped (Signed)
PHYSICAL THERAPY  Evaluation (10/29/22 1046)          Patient Name:  Christopher Brennan       Medical Record Number: [id number]   Date of Birth: Mar 10, 1946  Sex: Male        Treatment Diagnosis: Pt presents to PT eval with impaired balance, decreased sensation and decreased mobility impacting his ability to safely navigate his home and community.     Activity Tolerance: Tolerated treatment well     ASSESSMENT  Problem List: Impaired sensation, Impaired balance, Decreased strength, Decreased safety awareness, Decreased endurance, Gait deviation, Fall risk      Assessment : Christopher Brennan is a 77 y.o. 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      Pt presents to PT eval  with above deficits impacting safe mobility. Pt hows increased instability ambulating with SPC and suggested use of RW for increased support with pt refusing. Skilled acute PT is necessary to address above deficits in order to maximize independence and improve functional mobility. Recommend post acute PT 5x/wk at a low intensity to maximize safety and functional independence. After a review of the personal factors, comorbidities, clinical presentation, and examination of the number of affected body systems, the patient presents as a moderate complexity case.      Today's Interventions: PT eval, AMPAC, bed mobility, transfers, toilet transfer, gait, Pt education: PT role/POC, DME use, importance of OOB mobility, safe progression of mobility     Personal Factors/Comorbidities Present: 3+      Clinical Presentation: Evolving    Clinical Decision Making: Moderate     AM-PAC-6 click    Difficulty turning over In bed?: None - Modified Independent/Independent  Difficulty sitting down/standing up from chair with arms? : A Little - Minimal/Contact Guard Assist/Supervision  Difficulty moving from supine to sitting on edge of bed?: A Little - Minimal/Contact Guard Assist/Supervision  Help moving to and from bed from wheelchair?: A Little - Minimal/Contact Guard Assist/Supervision  Help currently needed walking in a hospital room?: A Little - Minimal/Contact Guard Assist/Supervision  Help currently needed climbing 3-5 steps with railing?: A Little - Minimal/Contact Guard Assist/Supervision    Basic Mobility Score:  Basic Mobility Score 6 click: 19    6 click  Score (in points): % of Functional Impairment, Limitation, Restriction  6: 100% impaired, limited, restricted  7-8: At least 80%, but less than 100% impaired, limited restricted  9-13: At least 60%, but less than 80% impaired, limited restricted  14-19: At least 40%, but less than 60% impaired, limited restricted  20-22: At least 20%, but less than 40% impaired, limited restricted  23: At least 1%, but less than 20% impaired, limited restricted  24: 0% impaired, limited restricted      PLAN  Planned Frequency of Treatment:  1-2x per day for: 3-4x week Planned Treatment Duration: 11/12/22     Planned Interventions: Education (Patient/Family/Caregiver), Self-care / Home Management training, Gait training, Therapeutic Exercise, Home exercise program, Therapeutic Activity, Neuromuscular re-education     Post-Discharge Physical Therapy Recommendations:  PT Post Acute Discharge Recommendations: 5x weekly, Low intensity, Supervision for safety during transfers, Supervision for all mobility due to fall risk, Skilled PT services indicated      PT DME Recommendations: Rolling walker (however pt refuses to use during session)  Goals:   Patient and Family Goals: To go home     SHORT GOAL #1: Pt will be able to perform all functional transfers with mod I and LRAD               Time Frame : 2 weeks  SHORT GOAL #2: Pt will be able to ambulate 150 ft with LRAD and mod I              Time Frame : 2 weeks  SHORT GOAL #3: Pt will be able to perform all bed mobility with mod I              Time Frame : 2 weeks                        Prognosis:  Good  Positive Indicators: PLOF  Barriers to Discharge: Inability to safely perform ADLS, Impaired Balance, Decreased safety awareness, Impulsivity     SUBJECTIVE  Patient reports: Im sleepy  Medical Updates Since Last Visit: Pt found/left supine in bed with all needs in reach including call bell     Prior Functional Status: Pt reports being independent with all mobility with use of SPC. Does not drive. 1 fall reported where his feet got tangle up and he hit the wall and was able to steady himself. He likes to watch youtube and mow the grass.  Equipment available at home: Straight cane        Past Medical History:   Diagnosis Date    Angina pectoris (CMS-HCC) 06/24/2018    Added automatically from request for surgery [id number]    Anxiety     CAD S/P percutaneous coronary angioplasty     GERD (gastroesophageal reflux disease)     Hypercholesteremia     Hypertension     Myocardial infarction (CMS-HCC)     3 MIs    Panhypopituitarism (CMS-HCC)     Tobacco abuse     Unstable angina (CMS-HCC) 04/05/2018            Social History     Tobacco Use    Smoking status: Every Day     Current packs/day: 1.50     Average packs/day: 1.5 packs/day for 60.0 years (90.0 ttl pk-yrs)     Types: Cigarettes    Smokeless tobacco: Former    Tobacco comments:     1-1.5ppd. Aims to reduce use.   Substance Use Topics    Alcohol use: No     Alcohol/week: 0.0 standard drinks of alcohol       Past Surgical History:   Procedure Laterality Date    CARDIAC SURGERY      PR CATH PLACE/CORON ANGIO, IMG SUPER/INTERP,W LEFT HEART VENTRICULOGRAPHY N/A 10/29/2015    Procedure: Left Heart Catheterization W Intervention;  Surgeon: Orpha Bur, MD;  Location: [facility] CATH;  Service: Cardiology    PR CATH PLACE/CORON ANGIO, IMG SUPER/INTERP,W LEFT HEART VENTRICULOGRAPHY N/A 08/01/2016    Procedure: Left Heart Catheterization W Intervention;  Surgeon: Alvira Philips, MD;  Location: [facility] CATH;  Service: Cardiology    PR CATH PLACE/CORON ANGIO, IMG SUPER/INTERP,W LEFT HEART VENTRICULOGRAPHY N/A 03/05/2017    Procedure: Left Heart Catheterization W Intervention;  Surgeon: Alvira Philips, MD;  Location: [facility] CATH;  Service: Cardiology    PR CATH PLACE/CORON ANGIO, IMG SUPER/INTERP,W LEFT HEART VENTRICULOGRAPHY N/A 08/06/2017    Procedure: CATH LEFT HEART CATHETERIZATION W INTERVENTION;  Surgeon: Alvira Philips, MD;  Location: [facility] CATH;  Service: Cardiology    PR CATH PLACE/CORON ANGIO, IMG SUPER/INTERP,W LEFT HEART VENTRICULOGRAPHY N/A 04/09/2018    Procedure: Left Heart Catheterization W Intervention;  Surgeon: Alvira Philips, MD;  Location: [facility] CATH;  Service: Cardiology    PR CATH PLACE/CORON ANGIO, IMG SUPER/INTERP,W LEFT HEART VENTRICULOGRAPHY N/A 07/03/2018    Procedure: CATH LEFT HEART CATHETERIZATION W INTERVENTION;  Surgeon: Alvira Philips, MD;  Location: [facility] CATH;  Service: Cardiology    PR CATH PLACE/CORON ANGIO, IMG SUPER/INTERP,W LEFT HEART VENTRICULOGRAPHY N/A 10/29/2019    Procedure: Left Heart Catheterization;  Surgeon: Alvira Philips, MD;  Location: [facility] CATH;  Service: Cardiology    PR CATH PLACE/CORON ANGIO, IMG SUPER/INTERP,W LEFT HEART VENTRICULOGRAPHY N/A 01/01/2020    Procedure: Left Heart Catheterization W Intervention;  Surgeon: Rosana Hoes, MD;  Location: [facility] CATH;  Service: Cardiology    PR CATH PLACE/CORON ANGIO, IMG SUPER/INTERP,W LEFT HEART VENTRICULOGRAPHY N/A 05/22/2022    Procedure: Left Heart Catheterization;  Surgeon: Alvira Philips, MD;  Location: [facility] CATH;  Service: Cardiology             Family History   Problem Relation Age of Onset    Hypertension Father     Cancer Sister     Hypertension Sister     Cancer Brother     Coronary artery disease Paternal Uncle     Anemia Neg Hx     Diabetes Neg Hx     Kidney disease Neg Hx     Obesity Neg Hx     Thyroid disease Neg Hx     Osteoporosis Neg Hx Allergies: Statins-hmg-coa reductase inhibitors and Doxycycline          Objective Findings  Precautions / Restrictions  Precautions: Falls precautions  Weight Bearing Status: Non-applicable  Required Braces or Orthoses: Non-applicable     Communication Preference: Verbal          Pain Comments: Pt denies pain  Medical Tests / Procedures: reviewed per epic  Equipment / Environment: Vascular access (PIV, TLC, Port-a-cath, PICC)     Vitals/Orthostatics : VSS per epic     Living Situation  Living Environment: House  Lives With: Alone (daughter and grandkids live near by)  Home Living: One level home, Level entry, Walk-in shower, Standard height toilet, Grab bars in shower  Caregiver Identified?: Yes  Caregiver Availability: Intermittent  Caregiver Ability: Limited lifting      Cognition: WFL  Cognition comment: Pt able to answer all questions appropriately and follow commands  Orientation: Oriented x4  Visual/Perception: Wears Glasses/Contacts  Hearing: Hearing aids not present     Skin Inspection: Bruising (BIL UE and LE)     Upper Extremities  UE ROM: Right WFL, Left WFL  UE Strength: Left WFL, Right WFL    Lower Extremities  LE ROM: Right WFL, Left WFL  LE Strength: Left WFL, Right WFL    Trunk and Cervical ROM  Cervical ROM: WFL  Trunk ROM: WFL     Coordination: Not tested  Proprioception: Not tested  Sensation: Impaired (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)  Motor/Sensory/Neuro Comments: N/T BIL hands and feet    Static Sitting-Level of Assistance: Supervision  Dynamic Sitting-Level of Assistance: Administrator, sports of Assistance: Stand by assistance  Dynamic Standing - Level of Assistance: Minimum assistance, Contact guard  Standing Balance comments: 1 posterior LOB requiring minA to recovery with use of SPC      Bed Mobility: Supine to Sit  Supine  to Sit assistance level: Standby assist, set-up cues, supervision of patient - no hands on  Bed Mobility: Pt able to scoot to EOB with BIL UE support and increased time to perform task     Transfers: Sit to Stand  Sit to Stand assistance level: Contact guard assist, steadying assist  Transfer comments: with SPC; pt able to perform toilet trasnfer with use of grab bars and SBA      Gait Level of Assistance: Minimal assist, patient does 75% or more  Gait Assistive Device: Straight Cane  Gait Distance Ambulated (ft): 30 ft  Skilled Treatment Performed: Pt ambulates with SPC and CGA with wide BOS, decreased step length, flat foot strike, and increased lateral sway with occasionally minA needed to correct. Pt refused to use RW for increased stabilty.     Stairs: NT            Patient at end of session: All needs in reach, Alarm activated, In bed, Lines intact, Nurse notified    Physical Therapy Session Duration  PT Individual [mins]: 29          I attest that I have reviewed the above information.  Signed: Sherral Hammers, PT  Filed 10/29/2022

## 2022-10-29 NOTE — Unmapped (Signed)
VENOUS ACCESS ULTRASOUND PROCEDURE NOTE    Indications:   Poor venous access.    The Venous Access Team has assessed this patient for the placement of a PIV. Ultrasound guidance was necessary to obtain access.     Procedure Details:  Identity of the patient was confirmed via name, medical record number and date of birth. The availability of the correct equipment was verified.    The vein was identified for ultrasound catheter insertion.  Field was prepared with necessary supplies and equipment.  Probe cover and sterile gel utilized.  Insertion site was prepped with chlorhexidine solution and allowed to dry.  The catheter extension was primed with normal saline. A(n) 22 gauge 1.75 catheter was placed in the L Upper Arm with 1attempt(s). See LDA for additional details.    Catheter aspirated, 3 mL blood return present. The catheter was then flushed with 10 mL of normal saline. Insertion site cleansed, and dressing applied per manufacturer guidelines. The catheter was inserted with difficulty due to poor vasculatureby Melodye Ped, RN.      RN was notified.     Thank you,     Melodye Ped, RN Venous Access Team   [phone]     Workup / Procedure Time:  30 minutes    See images below:

## 2022-10-29 NOTE — Unmapped (Signed)
OCCUPATIONAL THERAPY  Evaluation (10/29/22 0943)    Patient Name:  Christopher Brennan       Medical Record Number: [id number]   Date of Birth: 10-Jun-1946  Sex: Male            OT Treatment Diagnosis:  Pt presents to OT evaluation with decreased activity tolerance and impaired safety awareness impacting safe and independent ADL participation    Assessment  Problem List: Impaired judgement, Decreased safety awareness, Decreased activity tolerance, Fall risk, Impaired balance  Assessment: Christopher Brennan is a 77 y.o. 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  Patient seen for initial OT evaluation and occupational profile. With consideration of patient's occupational profile, assessment review, level of clinical decision making involved, and intervention plan, patient presents as a moderate complexity case w/ the following functional deficits: impaired dynamic balance , impaired safety awareness, and increased fall risk  that impact independent participation in ADLs. Recommend post-acute OT 3x with confirmed 24/7 supervision to maximize safety and functional independence.  Today's Interventions: Pt educated on role of OT, OT POC, importance of OOB/ADL participation with Engineer, manufacturing. Pt able to complete supine > sit with mod I, STS from low bed with SBA + cane, pt experienced 1x posterior LOB 2/2 impulsivity with standing and resulted into abruptly sitting back down on bed, mobility to bathroom with SBA + cane with unsteady gait, toilet hygiene with supervision, donning/doffing socks with IND.    Activity Tolerance During Today's Session  Tolerated treatment well    Plan  Planned Frequency of Treatment:  1-2x per day for: 2-3x week  Planned Treatment Duration: 11/19/22    Planned Interventions:  Clinical research associate, Education (Patient/Family/Caregiver), Home Exercise Program, Manual Therapy, Modalities, Orthotic Management, Neuromuscular Re-education, Self-Care/Home Training, Therapeutic Exercise, Therapeutic Activity, Visual/Perceptual training      Post-Discharge Occupational Therapy Recommendations:   3x weekly, Supervision needed for safety with functional transfers, Would benefit from assistance with IADLs/ADLs, Supervision needed for safety with basic self-care routines (With confirmed 24/7 supervision)   OT DME Recommendations: Shower chair with back -        GOALS:   Patient and Family Goals: To go home    Short Term:  SHORT GOAL #1: Pt will complete bathing with IND   Time Frame : 3 weeks  SHORT GOAL #2: Pt will tolerate 10+ minute standing ADL with mod I   Time Frame : 3 weeks  SHORT GOAL #3: Pt will complete mobility between areas of ADL with mod I + no cues for stafety   Time Frame : 3 weeks                   Prognosis:  Good  Positive Indicators:  PLOF  Barriers to Discharge: Inability to safely perform ADLS, Impaired Balance, Decreased safety awareness, Impulsivity    Subjective  Medical Updates Since Last Visit:      Pain  Pt -denied pain    Prior Functional Status Pt reported IND with ADL and with functional mobility. Pt reported 1x fall where his feet got tangled up. Pt denied driving. Pt enjoys watching Youtube and mowing the grass.    Living Situation  Living Environment: House  Lives With: Alone (Daughter and grandkids live nearby and can help)  Home Living: One level home, Level entry, Walk-in shower, Standard height toilet, Grab bars  in shower  Caregiver Identified?: Yes  Caregiver Availability: Intermittent  Caregiver Ability: Limited lifting             Equipment available at home: Straight cane             Past Medical History:   Diagnosis Date    Angina pectoris (CMS-HCC) 06/24/2018    Added automatically from request for surgery [id number]    Anxiety     CAD S/P percutaneous coronary angioplasty     GERD (gastroesophageal reflux disease) Hypercholesteremia     Hypertension     Myocardial infarction (CMS-HCC)     3 MIs    Panhypopituitarism (CMS-HCC)     Tobacco abuse     Unstable angina (CMS-HCC) 04/05/2018    Social History     Tobacco Use    Smoking status: Every Day     Current packs/day: 1.50     Average packs/day: 1.5 packs/day for 60.0 years (90.0 ttl pk-yrs)     Types: Cigarettes    Smokeless tobacco: Former    Tobacco comments:     1-1.5ppd. Aims to reduce use.   Substance Use Topics    Alcohol use: No     Alcohol/week: 0.0 standard drinks of alcohol      Past Surgical History:   Procedure Laterality Date    CARDIAC SURGERY      PR CATH PLACE/CORON ANGIO, IMG SUPER/INTERP,W LEFT HEART VENTRICULOGRAPHY N/A 10/29/2015    Procedure: Left Heart Catheterization W Intervention;  Surgeon: Orpha Bur, MD;  Location: [facility] CATH;  Service: Cardiology    PR CATH PLACE/CORON ANGIO, IMG SUPER/INTERP,W LEFT HEART VENTRICULOGRAPHY N/A 08/01/2016    Procedure: Left Heart Catheterization W Intervention;  Surgeon: Alvira Philips, MD;  Location: [facility] CATH;  Service: Cardiology    PR CATH PLACE/CORON ANGIO, IMG SUPER/INTERP,W LEFT HEART VENTRICULOGRAPHY N/A 03/05/2017    Procedure: Left Heart Catheterization W Intervention;  Surgeon: Alvira Philips, MD;  Location: [facility] CATH;  Service: Cardiology    PR CATH PLACE/CORON ANGIO, IMG SUPER/INTERP,W LEFT HEART VENTRICULOGRAPHY N/A 08/06/2017    Procedure: CATH LEFT HEART CATHETERIZATION W INTERVENTION;  Surgeon: Alvira Philips, MD;  Location: [facility] CATH;  Service: Cardiology    PR CATH PLACE/CORON ANGIO, IMG SUPER/INTERP,W LEFT HEART VENTRICULOGRAPHY N/A 04/09/2018    Procedure: Left Heart Catheterization W Intervention;  Surgeon: Alvira Philips, MD;  Location: [facility] CATH;  Service: Cardiology    PR CATH PLACE/CORON ANGIO, IMG SUPER/INTERP,W LEFT HEART VENTRICULOGRAPHY N/A 07/03/2018    Procedure: CATH LEFT HEART CATHETERIZATION W INTERVENTION;  Surgeon: Alvira Philips, MD;  Location: [facility] CATH;  Service: Cardiology    PR CATH PLACE/CORON ANGIO, IMG SUPER/INTERP,W LEFT HEART VENTRICULOGRAPHY N/A 10/29/2019    Procedure: Left Heart Catheterization;  Surgeon: Alvira Philips, MD;  Location: [facility] CATH;  Service: Cardiology    PR CATH PLACE/CORON ANGIO, IMG SUPER/INTERP,W LEFT HEART VENTRICULOGRAPHY N/A 01/01/2020    Procedure: Left Heart Catheterization W Intervention;  Surgeon: Rosana Hoes, MD;  Location: [facility] CATH;  Service: Cardiology    PR CATH PLACE/CORON ANGIO, IMG SUPER/INTERP,W LEFT HEART VENTRICULOGRAPHY N/A 05/22/2022    Procedure: Left Heart Catheterization;  Surgeon: Alvira Philips, MD;  Location: [facility] CATH;  Service: Cardiology    Family History   Problem Relation Age of Onset    Hypertension Father     Cancer Sister     Hypertension Sister     Cancer Brother     Coronary artery disease Paternal Uncle  Anemia Neg Hx     Diabetes Neg Hx     Kidney disease Neg Hx     Obesity Neg Hx     Thyroid disease Neg Hx     Osteoporosis Neg Hx         Statins-hmg-coa reductase inhibitors and Doxycycline     Objective Findings  Precautions / Restrictions  Falls precautions       Weight Bearing  Non-applicable    Required Braces or Orthoses  Non-applicable    Communication Preference  Verbal    Equipment / Environment  Vascular access (PIV, TLC, Port-a-cath, PICC), Telemetry     Functional Mobility  Transfers: Standby assist  Bed Mobility - Needs Assistance: Modified Independent  Ambulation: Close SBA, unsteady gait to bathroom with cane and impulsivity with resistance to education    ADLs  ADLs: Needs assistance with ADLs  ADLs - Needs Assistance: Feeding, Grooming, Bathing, Toileting, UB dressing, LB dressing  Feeding - Needs Assistance: Set Up Assist, Performed seated  Grooming - Needs Assistance: Set Up Assist, Performed seated  Bathing - Needs Assistance: Supervision, Performed seated  Toileting - Needs Assistance: Supervision, Performed seated  UB Dressing - Needs Assistance: Set Up Assist, Performed seated  LB Dressing - Needs Assistance: Performed seated    Vitals / Orthostatics  Vitals/Orthostatics: VSS    Cognition   Orientation Level:  Oriented x 4   Arousal/Alertness:  Appropriate responses to stimuli   Attention Span:  Appears intact   Memory:  Appears intact   Following Commands:  Follows all commands and directions without difficulty   Safety Judgment:  Decreased awareness of need for safety, Impulsivity   Awareness of Errors:      Problem Solving:  Able to problem solve independently, Assistance required to identify errors made, Assistance required to generate solutions, Assistance required to implement solutions   Comments:      Vision / Hearing   Vision: Glasses not present     Hearing: Hearing aids not present         Hand Function:  Right Hand Function: Right hand grip strength, ROM and coordination WNL  Left Hand Function: Left hand grip strength, ROM and coordination WNL  Hand Dominance: Unknown    Skin Inspection:  Skin Inspection: Intact where visualized    ROM / Strength:  UE ROM/Strength: Left WFL, Right WFL  LE ROM/Strength: Right WFL, Left WFL    Coordination:  Coordination: Not tested    Sensation:  Sensory/ Proprioception/ Stereognosis comments: Pt reported intermittent numbness and tingling in hands and feet, reported neuropathy in B feet    Balance:  Static Sitting-Level of Assistance: Supervision  Dynamic Sitting-Level of Assistance: Supervision    Static Standing-Level of Assistance: Stand by assistance  Dynamic Standing - Level of Assistance: Stand by assistance  Standing Balance comments: 1x posterior LOB resulting in pt sitting back down on the bed abruptly      AM-PAC-Daily Activity  Lower Body Dressing assistance needs: A Little - Minimal/Contact Guard Assist/Supervision  Bathing assistance needs: A Little - Minimal/Contact Guard Assist/Supervision  Toileting assistance needs: A Little - Minimal/Contact Guard Assist/Supervision  Upper Body Dressing assistance needs: A Little - Minimal/Contact Guard Assist/Supervision  Personal Grooming assistance needs: A Little - Minimal/Contact Guard Assist/Supervision  Eating Meals assistance needs: A Little - Minimal/Contact Guard Assist/Supervision    Daily Activity Score:  Daily Activity Score: 18    Score (in points): % of Functional Impairment, Limitation, Restriction  6: 100% impaired, limited, restricted  7-8: At least 80%, but less than 100% impaired, limited restricted  9-13: At least 60%, but less than 80% impaired, limited restricted  14-19: At least 40%, but less than 60% impaired, limited restricted  20-22: At least 20%, but less than 40% impaired, limited restricted  23: At least 1%, but less than 20% impaired, limited restricted  24: 0% impaired, limited restricted    Patient at end of session: All needs in reach, Nurse notified, Lines intact          Occupational Therapy Session Duration  OT Individual [mins]: 19         I attest that I have reviewed the above information.  Signed: Marijo Conception, OT  Filed 10/29/2022

## 2022-10-30 LAB — URINALYSIS WITH MICROSCOPY
BACTERIA: NONE SEEN /HPF
BILIRUBIN UA: NEGATIVE
GLUCOSE UA: NEGATIVE
HYALINE CASTS: 3 /LPF — ABNORMAL HIGH (ref 0–1)
KETONES UA: NEGATIVE
LEUKOCYTE ESTERASE UA: NEGATIVE
NITRITE UA: NEGATIVE
PH UA: 6 (ref 5.0–9.0)
RBC UA: 1 /HPF (ref <=3)
SPECIFIC GRAVITY UA: 1.024 (ref 1.003–1.030)
SQUAMOUS EPITHELIAL: <1 /HPF (ref 0–5)
UROBILINOGEN UA: <2
WBC UA: 2 /HPF (ref <=2)

## 2022-10-30 LAB — PHOSPHORUS: PHOSPHORUS: 1.4 mg/dL — ABNORMAL LOW (ref 2.4–5.1)

## 2022-10-30 LAB — HEPATIC FUNCTION PANEL
ALBUMIN: 2.6 g/dL — ABNORMAL LOW (ref 3.4–5.0)
ALKALINE PHOSPHATASE: 788 U/L — ABNORMAL HIGH (ref 46–116)
ALT (SGPT): 168 U/L — ABNORMAL HIGH (ref 10–49)
AST (SGOT): 287 U/L — ABNORMAL HIGH (ref <=34)
BILIRUBIN DIRECT: 2.5 mg/dL — ABNORMAL HIGH (ref 0.00–0.30)
BILIRUBIN TOTAL: 3.2 mg/dL — ABNORMAL HIGH (ref 0.3–1.2)
PROTEIN TOTAL: 6.1 g/dL (ref 5.7–8.2)

## 2022-10-30 LAB — APTT
APTT: 44.8 s — ABNORMAL HIGH (ref 24.8–38.4)
HEPARIN CORRELATION: 0.3

## 2022-10-30 LAB — MAGNESIUM: MAGNESIUM: 2.1 mg/dL (ref 1.6–2.6)

## 2022-10-30 LAB — CBC
HEMATOCRIT: 48.2 % — ABNORMAL HIGH (ref 39.0–48.0)
HEMOGLOBIN: 16.1 g/dL (ref 12.9–16.5)
MEAN CORPUSCULAR HEMOGLOBIN CONC: 33.4 g/dL (ref 32.0–36.0)
MEAN CORPUSCULAR HEMOGLOBIN: 27.6 pg (ref 25.9–32.4)
MEAN CORPUSCULAR VOLUME: 82.8 fL (ref 77.6–95.7)
MEAN PLATELET VOLUME: 9.9 fL (ref 6.8–10.7)
PLATELET COUNT: 98 10*9/L — ABNORMAL LOW (ref 150–450)
RED BLOOD CELL COUNT: 5.82 10*12/L — ABNORMAL HIGH (ref 4.26–5.60)
RED CELL DISTRIBUTION WIDTH: 21.6 % — ABNORMAL HIGH (ref 12.2–15.2)
WBC ADJUSTED: 7 10*9/L (ref 3.6–11.2)

## 2022-10-30 LAB — BASIC METABOLIC PANEL
ANION GAP: 5 mmol/L (ref 5–14)
BLOOD UREA NITROGEN: 32 mg/dL — ABNORMAL HIGH (ref 9–23)
BUN / CREAT RATIO: 18
CALCIUM: 8.7 mg/dL (ref 8.7–10.4)
CHLORIDE: 114 mmol/L — ABNORMAL HIGH (ref 98–107)
CO2: 21 mmol/L (ref 20.0–31.0)
CREATININE: 1.82 mg/dL — ABNORMAL HIGH
EGFR CKD-EPI (2021) MALE: 38 mL/min/{1.73_m2} — ABNORMAL LOW (ref >=60)
GLUCOSE RANDOM: 86 mg/dL (ref 70–179)
POTASSIUM: 4.3 mmol/L (ref 3.4–4.8)
SODIUM: 140 mmol/L (ref 135–145)

## 2022-10-30 MED ADMIN — ezetimibe (ZETIA) tablet 20 mg: 20 mg | ORAL | @ 13:00:00

## 2022-10-30 MED ADMIN — mirtazapine (REMERON) tablet 22.5 mg: 22.5 mg | ORAL | @ 02:00:00

## 2022-10-30 MED ADMIN — potassium & sodium phosphates 250mg (PHOS-NAK/NEUTRA PHOS) packet 1 packet: 1 | ORAL | @ 18:00:00 | Stop: 2022-10-30

## 2022-10-30 MED ADMIN — escitalopram oxalate (LEXAPRO) tablet 20 mg: 20 mg | ORAL | @ 13:00:00

## 2022-10-30 MED ADMIN — finasteride (PROSCAR) tablet 5 mg: 5 mg | ORAL | @ 13:00:00

## 2022-10-30 MED ADMIN — lidocaine 4 % patch 1 patch: 1 | TRANSDERMAL | @ 18:00:00

## 2022-10-30 MED ADMIN — tamsulosin (FLOMAX) 24 hr capsule 0.4 mg: .4 mg | ORAL | @ 13:00:00

## 2022-10-30 MED ADMIN — pantoprazole (Protonix) EC tablet 40 mg: 40 mg | ORAL | @ 13:00:00

## 2022-10-30 MED ADMIN — aspirin chewable tablet 81 mg: 81 mg | ORAL | @ 13:00:00

## 2022-10-30 MED ADMIN — lactated ringers bolus 1,000 mL: 1000 mL | INTRAVENOUS | @ 02:00:00

## 2022-10-30 MED ADMIN — LORazepam (ATIVAN) tablet 0.5 mg: .5 mg | ORAL | @ 22:00:00

## 2022-10-30 MED ADMIN — lactated Ringers infusion: 100 mL/h | INTRAVENOUS | @ 21:00:00 | Stop: 2022-10-31

## 2022-10-30 MED ADMIN — pregabalin (LYRICA) capsule 75 mg: 75 mg | ORAL | @ 13:00:00

## 2022-10-30 MED ADMIN — nicotine (NICODERM CQ) 21 mg/24 hr patch 1 patch: 1 | TRANSDERMAL | @ 21:00:00

## 2022-10-30 MED ADMIN — predniSONE (DELTASONE) tablet 10 mg: 10 mg | ORAL | @ 13:00:00

## 2022-10-30 MED ADMIN — LORazepam (ATIVAN) tablet 0.5 mg: .5 mg | ORAL | @ 13:00:00

## 2022-10-30 MED ADMIN — levothyroxine (SYNTHROID) tablet 100 mcg: 100 ug | ORAL | @ 10:00:00

## 2022-10-30 NOTE — Unmapped (Signed)
Internal Medicine (MEDU) Progress Note    Assessment & Plan:   Christopher Brennan is a 77 y.o. 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 *        Active Problems    Diarrhea  Patient continues with diarrhea.  Pending GI pathogen panel.  C. difficile was negative.  Will continue IV fluids as below given GI losses and will avoid antidiarrheals while ruling out infection.  - IV fluids  - Avoiding antidiarrheals pending GIPP    AKI on CKD 3b (GFR 30-45)  Patient with significantly improved renal function now at 1.82 (BL 1.5-1.7) after IV fluids. Likely pre-renal in the setting of volume depletion. Will continue current management as below:  - Daily BMP  - IV fluids  - Renally dose meds  - Avoid nephrotoxins (including NSAIDs, contrasted studies, fleets enemas)    Non anion gap metabolic acidosis  Likely in the setting of bicarbonate loss due to diarrhea.  Will continue to monitor labs.    Elevated Lactate (resolved) - Hypovolemia  Elevated lactate resolved after IV fluids. Elevated lactate most likely in the setting of hypovolemia secondary to diarrhea.  Less suspicion for urosepsis given patient is asymptomatic and urine culture results are not significant.  Will proceed with the following:  - IV fluids  - Follow Bcx  - Follow GIPP  - Holding antibiotics given low concern for infection at this time    Urine Culture w/ E. faecalis  Urine culture with 10-50k colonies of Enterococcus faecalis. It could be that the patient is colonized given that he self caths at home when he retains urine due to his BPH. No signs of infection (dysuria, suprapubic/CVA tenderness) at this time, thus will defer antibiotics for now.    Transaminitis - Liver Masses c/f metastatic disease  Patient with transaminitis with CT findings concerning for metastatic disease. Showing masses throughout liver and spleen with enlarged periportal lymph nodes. Gallbladder normal and no ductal dilation. Low concern for stone causing symptoms. Possibly in setting of malignancy. Currently concerned about liver metastasis from unknown primary, although likely pulmonary primary in the setting of the lung mass as below.  - Consulted VIR for biopsy, tentatively occurring on 3/13-3/14  - Consulted cardiology per VIR request for management of periprocedural management of clopidogrel/apixaban   - Agree with ASA 81 mg and heparin gtt while inpatient     based on need for liver biopsy  - Resume plavix as soon as feasible post-procedure  - Hold heparin gtt 4 hours prior to procedure per VIR  - Trend LFTs, PT-INR  Upper Lobe Mass R Lung  Patient with known tobacco use history. 1ppd since teenager. CT scan showing 4.3 cm posterior RUL mass with multiple nodules. Unclear if this could be primary site.  -- Liver biopsy as above for further workup     Irregular Thickening of Bladder Wall   Concerning for another possible source of malignancy, as bladder cancer can spread to liver and lung. Of note, patient had various episodes of gross hematuria, although U/A showed a decrease in RBCs from prior sample at admission. Urology consulted, appreciate evaluation and recommendations. Stated that they would proceed with a bladder scan to make sure he is not retaining and urine studies first as the rest of the workup can be done in the outpatient setting, including cystoscopy. They would recommend proceeding first with the liver biopsy as already planned.  -- Urology consult, appreciate recommendations  -- Urine cytology  -- Bladder scan  -- Outpatient workup of hematuria, urology coordinating this      Aortic Aneurysm 4.6 cm - Dissection Common Illiac Artery  Was seen in cardiology clinic 2/8 and had CTA done showing increased abdominal aortic aneurysm up to 4.6 cm and new short segment dissection flap in R common iliac artery, with unchanged dissection in L common iliac artery.   -- Vascular surgery referral for the outpatient setting        Chronic Problems  CAD s/p PCI to distal LAD and OM1 in 05/2022: On Plavix alongside eliquis as below. on zetia, evolocumab  DVT hx: On eliquis at home, heparin drip in preparation for possible biopsy  CIDP: Holding Cellcept, also treated with IVIG in the outpatient setting  Restless leg: Lyrica 150 or 75 mg, unclear dosing.   MGUS: Follows with Dr. Barbette Merino.         Issues Impacting Complexity of Management:  The patient's presentation is complicated by the following clinically significant conditions requiring additional evaluation and treatment: - Hypercoagulable state requiring additional attention to DVT prophylaxis and treatment  - Chronic kidney disease POA requiring further investigation, treatment, or monitoring   - Volume depletion POA requiring further investigation, treatment, or monitoring  - Metastatic cancer POA requiring further investigation, treatment, or monitoring         Issues Impacting Complexity of Management:  -Discussion of risks/benefits of biopsy of lung or liver  with the patient including risk of risk factor: hemorrhage, infection, and medical complications.        Medical Decision Making: Reviewed records from the following unique sources  outpatient heme/onc, neurology, cardiology visit.      Daily Checklist:  Diet: Regular Diet  DVT PPx:  Heparin gtt while admitted  Code Status: Full Code  Dispo:  Continue current level of care    Team Contact Information:   Primary Team: Internal Medicine (MEDU)  Primary Resident: Sharlyn Bologna, MD  Resident's Pager: [phone] (Gen MedU Intern - Tower)      Interval History:   No acute events overnight. Patient is trying to maintain oral intake. Has developed new hematuria this morning (2-3 episodes).     ROS: Denies headache, chest pain, shortness of breath, abdominal pain, nausea, vomiting.    Objective:   Temp:  [36.6 ??C (97.9 ??F)-36.7 ??C (98.1 ??F)] 36.7 ??C (98.1 ??F)  Heart Rate:  [67-70] 69  Resp:  [18] 18  BP: (133-159)/(66-92) 159/82  SpO2:  [95 %-98 %] 97 %    Gen: NAD, converses   HENT: atraumatic, normocephalic  Heart: RRR  Lungs:  CTAB, no crackles or wheezes  Abdomen: soft, NTND  Skin:  scattered crustly skin lesions, known skin cancer per patient   Extremities: No edema  GU: Urethral meatus without blood or discharge

## 2022-10-30 NOTE — Unmapped (Signed)
Pt A&Ox4. No c/o pain. Up with SBA and cane to bathroom. Pt set of bed alarm and rushed to bathroom several times throughout shift d/t sudden need to have BM. Due to rush to use toilet, urine was not accurately charted overnight d/t pt's inability to use urinal at those times. Bloody discharge observed coming from penis, overnight resident alerted. Pt rested between care.      Problem: Adult Inpatient Plan of Care  Goal: Plan of Care Review  Outcome: Ongoing - Unchanged  Goal: Patient-Specific Goal (Individualized)  Outcome: Ongoing - Unchanged  Goal: Absence of Hospital-Acquired Illness or Injury  Outcome: Ongoing - Unchanged  Intervention: Identify and Manage Fall Risk  Recent Flowsheet Documentation  Taken 10/29/2022 2200 by Cecille Rubin, RN  Safety Interventions:   fall reduction program maintained   low bed   lighting adjusted for tasks/safety  Intervention: Prevent Skin Injury  Recent Flowsheet Documentation  Taken 10/29/2022 2200 by Cecille Rubin, RN  Positioning for Skin: Right  Device Skin Pressure Protection: absorbent pad utilized/changed  Skin Protection: adhesive use limited  Goal: Optimal Comfort and Wellbeing  Outcome: Ongoing - Unchanged  Goal: Readiness for Transition of Care  Outcome: Ongoing - Unchanged  Goal: Rounds/Family Conference  Outcome: Ongoing - Unchanged     Problem: Infection  Goal: Absence of Infection Signs and Symptoms  Outcome: Ongoing - Unchanged  Intervention: Prevent or Manage Infection  Recent Flowsheet Documentation  Taken 10/29/2022 2200 by Cecille Rubin, RN  Isolation Precautions: enteric precautions maintained     Problem: Self-Care Deficit  Goal: Improved Ability to Complete Activities of Daily Living  Outcome: Ongoing - Unchanged     Problem: VTE (Venous Thromboembolism)  Goal: Tissue Perfusion  Outcome: Ongoing - Unchanged  Goal: Right Ventricular Function  Outcome: Ongoing - Unchanged     Problem: Fall Injury Risk  Goal: Absence of Fall and Fall-Related Injury  Outcome: Ongoing - Unchanged  Intervention: Promote Injury-Free Environment  Recent Flowsheet Documentation  Taken 10/29/2022 2200 by Cecille Rubin, RN  Safety Interventions:   fall reduction program maintained   low bed   lighting adjusted for tasks/safety

## 2022-10-30 NOTE — Unmapped (Signed)
Internal Medicine (MEDU) Progress Note    Assessment & Plan:   Christopher Brennan is a 77 y.o. 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 *        Active Problems    Diarrhea  Patient continues with diarrhea.  Pending GI pathogen panel.  C. difficile was negative.  Will continue IV fluids as below given GI losses and will avoid antidiarrheals while ruling out infection.  - IV fluids  - Avoid antidiarrheals pending GIPP    Elevated Lactate (resolved) - Hypovolemia  Elevated lactate resolved after IV fluids. Urine culture with 10-50k colonies of Enterococcus faecalis.  Elevated lactate most likely in the setting of hypovolemia secondary to diarrhea.  Less suspicion for urosepsis given patient is asymptomatic and urine culture results are not significant.  Will proceed with the following:  - IV fluids  - Follow Bcx  - Follow GIPP  - Hold antibiotics given low concern for infection     AKI on CKD 3b (GFR 30-45)  Patient with worsening renal function, with creatinine of 2.4 up from 2.06 on admission.  Most likely etiology is prerenal based on GI loss history, although patient received contrast around 48 hours ago, thus raising the question for a potential component of contrast-induced nephropathy.  Will continue current management as below:  - Daily BMP  - IV fluids  - Renally dose meds  - Avoid nephrotoxins (including NSAIDs, contrasted studies, fleets enemas)    Non anion gap metabolic acidosis  Likely in the setting of bicarbonate loss due to diarrhea.  Will continue to monitor labs.    Transaminitis - Liver Masses c/f metastatic disease  Patient with transaminitis with CT findings concerning for metastatic disease. Showing masses throughout liver and spleen with enlarged periportal lymph nodes. Gallbladder normal and no ductal dilation. Low concern for stone causing symptoms. Possibly in setting of malignancy. Currently concerned about liver metastasis from unknown primary, although likely pulmonary primary in the setting of the lung mass as below.  - Consulted VIR for biopsy, tentatively occurring on 3/13-3/14  - Consulted cardiology per VIR request for management of periprocedural management of clopidogrel/apixaban   - Agree with ASA 81 mg and heparin gtt while inpatient     based on need for liver biopsy  - Resume plavix as soon as feasible post-procedure  - Hold heparin gtt 4 hours prior to procedure per VIR  - Trend hepatic function  - PT-INR     Upper Lobe Mass R Lung  Patient with known tobacco use history. 1ppd since teenager. CT scan showing 4.3 cm posterior RUL  mass with multiple nodules. Unclear if this could be primary site.     Irregular Thickening of Bladder Wall   Concerning for another possible source of malignancy, as bladder cancer can spread to liver and lung.  - Biopsy as above  - Will consult urology 3/11 in the AM     Aortic Aneurysm 4.6 cm - Dissection Common Illiac Artery  Was seen in cardiology clinic 2/8 and had CTA done showing increased abdominal aortic aneurysm up to 4.6 cm and new short segment dissection flap in R common iliac artery, with unchanged dissection in L common iliac artery.   - Consider Vascular consultation        Chronic Problems  CAD s/p PCI to distal LAD and OM1 in 05/2022: On Plavix alongside eliquis as below. on zetia, evolocumab  DVT hx: On eliquis at home, heparin drip in preparation for possible biopsy  CIDP: Holding Cellcept given concern for infection at this time, also treated with IVIG in the outpatient setting  Restless leg: Lyrica 150 or 75 mg, unclear dosing.   MGUS: Follows with Dr. Barbette Merino.         Issues Impacting Complexity of Management:  The patient's presentation is complicated by the following clinically significant conditions requiring additional evaluation and treatment: - Hypercoagulable state requiring additional attention to DVT prophylaxis and treatment  - Chronic kidney disease POA requiring further investigation, treatment, or monitoring   - Volume depletion POA requiring further investigation, treatment, or monitoring  - Metastatic cancer POA requiring further investigation, treatment, or monitoring         Issues Impacting Complexity of Management:  -Discussion of risks/benefits of biopsy of lung or liver  with the patient including risk of risk factor: hemorrhage, infection, and medical complications.        Medical Decision Making: Reviewed records from the following unique sources  outpatient heme/onc, neurology, cardiology visit.      Daily Checklist:  Diet: Regular Diet  DVT PPx:  Heparin gtt while admitted  Code Status: Full Code  Dispo:  Continue current level of care    Team Contact Information:   Primary Team: Internal Medicine (MEDU)  Primary Resident: Sharlyn Bologna, MD  Resident's Pager: [phone] (Gen MedU Intern - Tower)      Interval History:   No acute events overnight. Patient not able to tolerate much orally. Has had 3 episodes of diarrhea this morning.    ROS: Denies headache, chest pain, shortness of breath, abdominal pain, nausea, vomiting.    Objective:   Temp:  [36.6 ??C (97.9 ??F)-36.8 ??C (98.2 ??F)] 36.6 ??C (97.9 ??F)  Heart Rate:  [67-76] 70  Resp:  [16-18] 18  BP: (150-180)/(76-94) 154/76  SpO2:  [95 %-98 %] 98 %    Gen: NAD, converses   HENT: atraumatic, normocephalic  Heart: RRR  Lungs: CTAB, no crackles or wheezes  Abdomen: soft, NTND  Skin:  scattered crustly skin lesions, known skin cancer per patient   Extremities: No edema

## 2022-10-30 NOTE — Unmapped (Signed)
UROLOGY CONSULT NOTE    Requesting Attending Physician:  Zollie Beckers, MD  Service Requesting Consult:  Med General [facility] (MDU)  Service Providing Consult: SRU  Consulting Attending: Dr. 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 Urology consulted for hematuria.    Patient is afebrile and HDS. He is voiding spontaneously without any documented volumes or PVRs, he reports his urine is dark red though this has not been visualized by any providers.    Labs have not been collected this morning, but most recently show a hemoglobin of 15 (from baseline ~18 as he is polycythemic), no leukocytosis. He has an AKI to 2.41 from baseline ~1.7. Urine culture from 3/8 growing E faecalis.     Random bladder scan 159 low concern for retention.    CT on 3/8 showed innumerable masses in the liver/spleen, pericardiac nodule, and irregular circumferential bladder dome thickening. No hydronephrosis.     There are multiple potential etiologies of this patient's hematuria including BPH/urinary retention, urinary tract infection, and urinary tract malignancy. He has a confirmed UTI and history of BPH in addition to multiple risk factors for GU cancer (90 pack year smoking history, age, and gross hematuria). At this time must ensure he is not experiencing urinary retention and treat his E faecalis UTI. VIR is planning to biopsy his liver lesion which will be useful in terms of tissue diagnosis. Defer CTU for hematuria workup at this time given AKI. There is currently no indication for inpatient cystoscopy/biopsy, though this will also be part of his eventual hematuria workup.     Recommendations:  No acute urologic intervention is indicated  Please obtain urine cytology  Please obtain susceptibilities for urine culture and treat with culture-directed antibiotics  Continue flomax  Agree with VIR biopsy of liver lesions after appropriate AC hold  Patient will benefit from outpatient hematuria workup to include cystoscopy. This will be arranged by urology.    Discussed with Dr. Arlester Marker.  Thank you for this consult. Please page [phone] with any questions or concerns.    History of Present Illness:   Christopher Brennan is seen in consultation for hematuria at the request of Zollie Beckers, MD on the Med [employer] (MDU).     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     CT scan notable for large RUL lung mass and enlarged mediastinal lymph nodes, a rib lesion, and innumerable lesions within the liver/spleen concerning for metastases. He was also noted to have circumferential bladder wall thickening greatest at the bladder dome. He developed gross hematuria overnight (voiding spontaneously, no bladder scans obtained).    He has seen urology previously for BPH with episodes of urinary retention. He was taught CIC (to be performed once per day) at visit in December 2021. Not seen since then. He denies any history of gross hematuria. States he will cath only when he knows he will be away from a restroom for a long period of time or feels full. He has not cathed during this admission.      Patient does have a 90 pack-year smoking history.     Past Medical History:  Past Medical History:   Diagnosis Date    Angina pectoris (CMS-HCC) 06/24/2018    Added automatically from request for surgery [id number]    Anxiety     CAD S/P percutaneous coronary angioplasty     GERD (gastroesophageal reflux disease)     Hypercholesteremia     Hypertension     Myocardial infarction (CMS-HCC)     3 MIs    Panhypopituitarism (CMS-HCC)     Tobacco abuse     Unstable angina (CMS-HCC) 04/05/2018       Past Surgical History:   Past Surgical History:   Procedure Laterality Date    CARDIAC SURGERY      PR CATH PLACE/CORON ANGIO, IMG SUPER/INTERP,W LEFT HEART VENTRICULOGRAPHY N/A 10/29/2015    Procedure: Left Heart Catheterization W Intervention;  Surgeon: Orpha Bur, MD;  Location: [facility] CATH;  Service: Cardiology    PR CATH PLACE/CORON ANGIO, IMG SUPER/INTERP,W LEFT HEART VENTRICULOGRAPHY N/A 08/01/2016    Procedure: Left Heart Catheterization W Intervention;  Surgeon: Alvira Philips, MD;  Location: [facility] CATH;  Service: Cardiology    PR CATH PLACE/CORON ANGIO, IMG SUPER/INTERP,W LEFT HEART VENTRICULOGRAPHY N/A 03/05/2017    Procedure: Left Heart Catheterization W Intervention;  Surgeon: Alvira Philips, MD;  Location: [facility] CATH;  Service: Cardiology    PR CATH PLACE/CORON ANGIO, IMG SUPER/INTERP,W LEFT HEART VENTRICULOGRAPHY N/A 08/06/2017    Procedure: CATH LEFT HEART CATHETERIZATION W INTERVENTION;  Surgeon: Alvira Philips, MD;  Location: [facility] CATH;  Service: Cardiology    PR CATH PLACE/CORON ANGIO, IMG SUPER/INTERP,W LEFT HEART VENTRICULOGRAPHY N/A 04/09/2018    Procedure: Left Heart Catheterization W Intervention;  Surgeon: Alvira Philips, MD;  Location: [facility] CATH;  Service: Cardiology    PR CATH PLACE/CORON ANGIO, IMG SUPER/INTERP,W LEFT HEART VENTRICULOGRAPHY N/A 07/03/2018    Procedure: CATH LEFT HEART CATHETERIZATION W INTERVENTION;  Surgeon: Alvira Philips, MD;  Location: [facility] CATH;  Service: Cardiology    PR CATH PLACE/CORON ANGIO, IMG SUPER/INTERP,W LEFT HEART VENTRICULOGRAPHY N/A 10/29/2019    Procedure: Left Heart Catheterization;  Surgeon: Alvira Philips, MD;  Location: [facility] CATH;  Service: Cardiology    PR CATH PLACE/CORON ANGIO, IMG SUPER/INTERP,W LEFT HEART VENTRICULOGRAPHY N/A 01/01/2020    Procedure: Left Heart Catheterization W Intervention;  Surgeon: Rosana Hoes, MD;  Location: [facility] CATH;  Service: Cardiology    PR CATH PLACE/CORON ANGIO, IMG SUPER/INTERP,W LEFT HEART VENTRICULOGRAPHY N/A 05/22/2022    Procedure: Left Heart Catheterization;  Surgeon: Alvira Philips, MD;  Location: [facility] CATH;  Service: Cardiology       Medication:  Current Facility-Administered Medications   Medication Dose Route Frequency Provider Last Rate Last Admin    acetaminophen (TYLENOL) tablet 650 mg  650 mg Oral Q4H PRN Verl Bangs Moral, Lorin Glass, MD        aspirin chewable tablet 81 mg  81 mg Oral Daily Miro-Gonzalez, Angel A, MD   81 mg at 10/29/22 0859    calcium carbonate (TUMS) chewable tablet 200 mg of elem calcium  200 mg of elem calcium Oral TID PRN Miro-Gonzalez, Angel A, MD   200 mg of elem calcium at 10/28/22 1420    [Provider Hold] clopidogrel (PLAVIX) tablet 75 mg  75 mg Oral Daily Verl Bangs Moral, Lorin Glass, MD        escitalopram oxalate (LEXAPRO) tablet 20 mg  20 mg Oral Daily Verl Bangs Moral, Lorin Glass, MD   20 mg at 10/29/22 0858    ezetimibe (ZETIA) tablet 20 mg  20 mg Oral Daily Verl Bangs Moral, Lorin Glass, MD   20 mg at 10/29/22 0858    finasteride (PROSCAR) tablet 5 mg  5 mg Oral Daily Verl Bangs Moral, Lorin Glass, MD   5 mg at 10/29/22 0858    heparin (porcine) 1000 unit/mL injection 2,000 Units  2,000 Units Intravenous Q6H PRN Miro-Gonzalez, Kaleen Mask, MD        [Provider Hold] heparin 25,000 Units/250 mL (100 units/mL) in 0.45% saline infusion (premade)  0-24 Units/kg/hr Intravenous Continuous Miro-Gonzalez, Kaleen Mask, MD 11.17 mL/hr at 10/30/22 0025 11 Units/kg/hr at 10/30/22 0025    levothyroxine (SYNTHROID) tablet 100 mcg  100 mcg Oral daily Verl Bangs Moral, Lorin Glass, MD   100 mcg at 10/30/22 4540    LORazepam (ATIVAN) tablet 0.5 mg  0.5 mg Oral BID Ardith Dark, MD   0.5 mg at 10/29/22 1807    mirtazapine (REMERON) tablet 22.5 mg  22.5 mg Oral Nightly Verl Bangs Moral, Lorin Glass, MD   22.5 mg at 10/29/22 2204    [Provider Hold] mycophenolate (CELLCEPT) capsule 1,000 mg  1,000 mg Oral BID Verl Bangs Moral, Lorin Glass, MD 500 mg at 10/28/22 0923    ondansetron (ZOFRAN) tablet 4 mg  4 mg Oral Q8H PRN Miro-Gonzalez, Kaleen Mask, MD        pantoprazole (Protonix) EC tablet 40 mg  40 mg Oral Daily Verl Bangs Moral, Lorin Glass, MD   40 mg at 10/29/22 0859    polyethylene glycol (MIRALAX) packet 17 g  17 g Oral Daily PRN Miro-Gonzalez, Kaleen Mask, MD        predniSONE (DELTASONE) tablet 10 mg  10 mg Oral Daily Verl Bangs Moral, Lorin Glass, MD   10 mg at 10/29/22 0859    pregabalin (LYRICA) capsule 75 mg  75 mg Oral Daily Verl Bangs Moral, Lorin Glass, MD   75 mg at 10/29/22 9811    senna (SENOKOT) tablet 2 tablet  2 tablet Oral Nightly PRN Miro-Gonzalez, Kaleen Mask, MD        tamsulosin (FLOMAX) 24 hr capsule 0.4 mg  0.4 mg Oral Daily Verl Bangs Moral, Lorin Glass, MD   0.4 mg at 10/29/22 9147       Allergies:  Allergies   Allergen Reactions    Statins-Hmg-Coa Reductase Inhibitors      myalgias    Doxycycline Rash     Hand blistered in the sun       Social History:  Social History     Tobacco Use    Smoking status: Every Day     Current packs/day: 1.50     Average packs/day: 1.5 packs/day for 60.0 years (90.0 ttl pk-yrs)     Types: Cigarettes    Smokeless tobacco: Former    Tobacco comments:     1-1.5ppd. Aims to reduce use.   Vaping Use    Vaping status: Never Used   Substance Use Topics    Alcohol use: No     Alcohol/week: 0.0 standard drinks of alcohol    Drug use: Not Currently       Family History:  Family History   Problem Relation Age of Onset    Hypertension Father     Cancer Sister     Hypertension Sister     Cancer Brother     Coronary artery disease Paternal Uncle     Anemia Neg Hx     Diabetes Neg Hx     Kidney disease Neg Hx  Obesity Neg Hx     Thyroid disease Neg Hx     Osteoporosis Neg Hx        Review of Systems:  10 systems were reviewed and are negative except as noted specifically in the HPI.    Objective:     Intake/Output last 3 shifts:  I/O last 3 completed shifts:  In: 1963 [P.O.:460; I.V.:1103; IV Piggyback:400]  Out: 183 [Urine:183]  Vital signs in last 24 hours:  BP 159/82  - Pulse 69  - Temp 36.7 ??C (98.1 ??F) (Axillary)  - Resp 18  - Ht 182.9 cm (6')  - Wt (!) 101.5 kg (223 lb 12.8 oz)  - SpO2 97%  - BMI 30.35 kg/m??     Physical Exam:  General:  No acute distress, chronically ill appearing  HEENT: Normocephalic, atraumatic, pupils equal and round, sclera anicteric  Neck  Trachea midline, symmetrical  Lungs:   Normal work of breathing on 2 liters nasal cannula  Cardiac: Regular rate  Abdomen: Non tender, soft, non distended.  GU:  No CVA tenderness. Voiding spontaneously, urine is clear pink.  Extremities: Warm and well perfused  Neuro:             Alert and oriented, strength and sensation grossly normal      Most Recent Labs:  Recent Labs   Lab Units 10/29/22  0745 10/28/22  0835 10/27/22  1106   WBC 10*9/L 7.6 8.3 7.3   RBC 10*12/L 5.64* 5.84* 6.08*   HEMOGLOBIN g/dL 16.1 09.6 04.5   HEMATOCRIT % 46.8 49.0* 50.8*   MCV fL 83.1 84.0 83.6   MCH pg 27.1 27.6 27.2   MCHC g/dL 40.9 81.1 91.4   RDW % 21.3* 21.6* 21.8*   PLATELET COUNT (1) 10*9/L 93* 107* 114*   MPV fL 10.1 10.0 9.8     Recent Labs   Lab Units 10/29/22  0745 10/28/22  0835 10/27/22  1106   SODIUM mmol/L 140 141 139   POTASSIUM mmol/L 4.3 4.3 4.4   CHLORIDE mmol/L 113* 115* 112*   CO2 mmol/L 19.0* 18.0* 20.7   BUN mg/dL 37* 29* 36*   CREATININE mg/dL 7.82* 9.56* 2.13*   GLUCOSE mg/dL 64* 086 578   MAGNESIUM mg/dL 2.2 2.1  --      Recent Labs   Lab Units 10/29/22  0745 10/28/22  0835 10/27/22  1106   ALT U/L 138* 158* 158*   AST U/L 217* 247* 194*   ALK PHOS U/L 722* 776* 766*   ALBUMIN g/dL 2.5* 2.6* 2.7*   PROTEIN TOTAL g/dL 5.7 6.2 6.4   BILIRUBIN TOTAL mg/dL 2.3* 1.8* 2.2*   BILIRUBIN DIRECT mg/dL 4.69* 6.29*  --      Recent Labs   Lab Units 10/29/22  0745 10/28/22  2255 10/28/22  1542 10/28/22  0835   INR  1.20  --   --  1.07   APTT sec 90.6* 102.9* 33.9  --        Microbiology Data:  Blood Culture, Routine   Date Value Ref Range Status   10/27/2022 No Growth at 48 hours  Preliminary   10/27/2022 No Growth at 48 hours  Preliminary   06/30/2020 No Growth at 5 days  Final   06/30/2020 No Growth at 5 days  Final   06/14/2020 No Growth at 5 days  Final   06/14/2020 No Growth at 5 days  Final   05/27/2020 No Growth at 5 days  Final  Urine Culture, Comprehensive   Date Value Ref Range Status   10/27/2022 10,000 to 50,000 CFU/mL Enterococcus faecalis (A)  Final     Comment:     Susceptibility Testing By Consultation Only   03/17/2022 >100,000 CFU/mL Klebsiella oxytoca (A)  Final   06/30/2020 50,000 to 100,000 CFU/mL Pseudomonas aeruginosa (A)  Final   06/30/2020 >100,000 CFU/mL Pseudomonas aeruginosa (A)  Final   06/21/2020 <10,000 CFU/mL  Final   05/03/2017 Mixed Urogenital Flora  Final     No results found for: ANACX  Most recent Urinalysis:  Recent Labs   Lab Units 10/27/22  1550   LEUKOCYTES UA  Moderate*   NITRITE UA  Negative   RBC UA /HPF 14*   WBC UA /HPF 117*   SQUAM EPITHEL UA /HPF 1   BACTERIA UA /HPF None Seen      Urinalysis History:  Leukocyte Esterase, UA   Date Value Ref Range Status   10/27/2022 Moderate (A) Negative Final   12/21/2021 Large (A) Negative Final   04/20/2021 Large (A) Negative Final   03/11/2021 Moderate (A) Negative Final   06/30/2020 Large (A) Negative Final   06/30/2020 Large (A) Negative Final   06/23/2020 Negative Negative Final     Nitrite, UA   Date Value Ref Range Status   10/27/2022 Negative Negative Final   12/21/2021 Positive (A) Negative Final   04/20/2021 Positive (A) Negative Final   03/11/2021 Positive (A) Negative Final   06/30/2020 Positive (A) Negative Final   06/30/2020 Positive (A) Negative Final   06/23/2020 Negative Negative Final     RBC, UA   Date Value Ref Range Status   10/27/2022 14 (H) <=3 /HPF Final   12/21/2021 22 (H) <=3 /HPF Final   04/20/2021 80 (H) 0 - 3 /HPF Final   03/11/2021 4 (H) 0 - 3 /HPF Final   06/30/2020 >100 (H) <3 /HPF Final   06/30/2020 >100 (H) <3 /HPF Final   06/23/2020 <1 <3 /HPF Final     WBC, UA Date Value Ref Range Status   10/27/2022 117 (H) <=2 /HPF Final   12/21/2021 >182 (H) <=2 /HPF Final   04/20/2021 >100 (H) 0 - 3 /HPF Final   03/11/2021 >100 (H) 0 - 3 /HPF Final   06/30/2020 >100 (H) <2 /HPF Final   06/30/2020 >100 (H) <2 /HPF Final   06/23/2020 <1 <2 /HPF Final     Squam Epithel, UA   Date Value Ref Range Status   10/27/2022 1 0 - 5 /HPF Final   12/21/2021 <1 0 - 5 /HPF Final   04/20/2021 0 0 - 5 /HPF Final   03/11/2021 <1 0 - 5 /HPF Final   06/30/2020 2 0 - 5 /HPF Final   06/30/2020 7 (H) 0 - 5 /HPF Final   06/23/2020 <1 0 - 5 /HPF Final     Bacteria, UA   Date Value Ref Range Status   10/27/2022 None Seen None Seen /HPF Final   12/21/2021 Many (A) None Seen /HPF Final   04/20/2021 Moderate (A) None Seen /HPF Final   03/11/2021 Many (A) None Seen /HPF Final   06/30/2020 None Seen None Seen /HPF Final   06/30/2020 None Seen None Seen /HPF Final   06/23/2020 None Seen None Seen /HPF Final        Imaging:  No results found.

## 2022-10-30 NOTE — Unmapped (Signed)
VENOUS ACCESS TEAM PROCEDURE    Nurse request was placed for a PIV by Venous Access Team (VAT).  Patient was assessed at bedside for placement of a PIV. PPE were donned per protocol.  Access was obtained. Blood return noted.  Dressing intact and device well secured.  Flushed with normal saline.  See LDA for details.  Pt advised to inform RN of any s/s of discomfort at the PIV site. Vat was able to place a piv in the left [facility] but his piv will not last. Informed primary care nurse that this piv will not last for continued iv therapy. Vat highly suggest another line for continued iv therapy.    Workup / Procedure Time:  15 minutes       Primary care  RN was notified.       Thank you,     Cyndie Mull, RN Venous Access Team

## 2022-10-30 NOTE — Unmapped (Addendum)
VENOUS ACCESS ULTRASOUND PROCEDURE NOTE    Indications:   Poor venous access.    The Venous Access Team has assessed this patient for the placement of a PIV. Ultrasound guidance was necessary to obtain access.     Procedure Details:  Identity of the patient was confirmed via name, medical record number and date of birth. The availability of the correct equipment was verified.    The vein was identified for ultrasound catheter insertion.  Field was prepared with necessary supplies and equipment.  Probe cover and sterile gel utilized.  Insertion site was prepped with chlorhexidine solution and allowed to dry.  The catheter extension was primed with normal saline. A(n) 20 gauge 1.75 catheter was placed in the R Forearm with 1attempt(s). See LDA for additional details.    Catheter aspirated, 1 mL blood return present. The catheter was then flushed with 10 mL of normal saline. Insertion site cleansed, and dressing applied per manufacturer guidelines. The catheter was inserted without difficultyby Cristal Deer, RN.     Care Nur RN was notified.     Thank you,     Cristal Deer, RN Venous Access Team   [phone]     Workup / Procedure Time:  30 minutes    See images below:

## 2022-10-30 NOTE — Unmapped (Signed)
Care Management  Initial Transition Planning Assessment              General  Care Manager assessed the patient by : In person interview with patient, In person interview with family, Medical record review, Discussion with Clinical Care team (Met with patient and his son Amada Jupiter.)  Orientation Level: Oriented X4  Functional level prior to admission: Independent (Modified Independent. Uses a cane to walk. Able to shower and make a meal for himself. He chooses not to drive. Has neuropathy and leg swelling.)  Reason for referral: Discharge Planning    Contact/Decision Maker  Extended Emergency Contact Information  Primary Emergency Contact: Isley,Kimberly  Home Phone: [phone]  Relation: Daughter  Secondary Emergency Contact: Feldkamp,Jennifer  Home Phone: [phone]  Relation: Daughter in Product manager Next of Kin / Guardian / [place] / Advance Directives     HCDM (patient stated preference): Lubertha Basque - Daughter - [phone]    HCDM (patient stated preference): Elan, Ohora - Son - [phone]    Advance Directive (Medical Treatment)  Does patient have an advance directive covering medical treatment?: Patient has advance directive covering medical treatment, copy not in chart. (States that his son Treylin Voet and daughter Lubertha Basque are his surrogate HCDM.)  Reason patient does not have an advance directive covering medical treatment:: Patient needs follow-up to complete one.    Health Care Decision Maker [HCDM] (Medical & Mental Health Treatment)  Healthcare Decision Maker: HCDM documented in the HCDM/Contact Info section.  Information offered on HCDM, Medical & Mental Health advance directives:: Patient given information.    Advance Directive (Mental Health Treatment)  Does patient have an advance directive covering mental health treatment?: Patient does not have advance directive covering mental health treatment.  Reason patient does not have an advance directive covering mental health treatment:: Patient needs follow-up to complete one.    Readmission Information    Have you been hospitalized in the last 30 days?: No     Patient Information  Lives with: Alone    Type of Residence: Private residence     Type of Residence: Mailing Address:  [address] [place] [address]  Contacts: Accompanied by: EMS personnel  Patient Phone Number: [phone] (mobile)  [phone] (home)         Medical Provider(s): Holland Commons, MD  Reason for Admission: Admitting Diagnosis:  Lactic acidosis [E87.20]  Malignancy (CMS-HCC) [C80.1]  Elevated LFTs [R79.89]  Acute kidney injury (CMS-HCC) [N17.9]  Past Medical History:   has a past medical history of Angina pectoris (CMS-HCC) (06/24/2018), Anxiety, CAD S/P percutaneous coronary angioplasty, GERD (gastroesophageal reflux disease), Hypercholesteremia, Hypertension, Myocardial infarction (CMS-HCC), Panhypopituitarism (CMS-HCC), Tobacco abuse, and Unstable angina (CMS-HCC) (04/05/2018).  Past Surgical History:   has a past surgical history that includes pr cath place/coron angio, img super/interp,w left heart ventriculography (N/A, 10/29/2015); pr cath place/coron angio, img super/interp,w left heart ventriculography (N/A, 08/01/2016); pr cath place/coron angio, img super/interp,w left heart ventriculography (N/A, 03/05/2017); Cardiac surgery; pr cath place/coron angio, img super/interp,w left heart ventriculography (N/A, 08/06/2017); pr cath place/coron angio, img super/interp,w left heart ventriculography (N/A, 04/09/2018); pr cath place/coron angio, img super/interp,w left heart ventriculography (N/A, 07/03/2018); pr cath place/coron angio, img super/interp,w left heart ventriculography (N/A, 10/29/2019); pr cath place/coron angio, img super/interp,w left heart ventriculography (N/A, 01/01/2020); and pr cath place/coron angio, img super/interp,w left heart ventriculography (N/A, 05/22/2022).   Previous admit date: 07/01/2020    Primary Insurance- Payor: Advertising copywriter MEDICARE ADV /  Plan: UNITED HEALTHC[employer] MEDICARE ADV / Product Type: *No Product type* /   Secondary Insurance - None  Prescription Coverage - Part D  Preferred Pharmacy - [facility] - [place], [place] - [phone]  [facility]  [facility]    Transportation home: Private vehicle    Support Systems/Concerns: Children, Family Members    Responsibilities/Dependents at home?: No    Home Care services in place prior to admission?: No     Equipment Currently Used at Home: cane, straight     Currently receiving outpatient dialysis?: No     Financial Information     Need for financial assistance?: No     Social Determinants of Health  Social Determinants of Health     Financial Resource Strain: Low Risk  (10/30/2022)    Overall Financial Resource Strain (CARDIA)     Difficulty of Paying Living Expenses: Not hard at all   Internet Connectivity: Not on file   Food Insecurity: No Food Insecurity (10/30/2022)    Hunger Vital Sign     Worried About Running Out of Food in the Last Year: Never true     Ran Out of Food in the Last Year: Never true   Tobacco Use: High Risk (10/29/2022)    Patient History     Smoking Tobacco Use: Every Day     Smokeless Tobacco Use: Former     Passive Exposure: Not on file   Housing/Utilities: Low Risk  (10/30/2022)    Housing/Utilities     Within the past 12 months, have you ever stayed: outside, in a car, in a tent, in an overnight shelter, or temporarily in someone else's home (i.e. couch-surfing)?: No     Are you worried about losing your housing?: No     Within the past 12 months, have you been unable to get utilities (heat, electricity) when it was really needed?: No   Alcohol Use: Not on file   Transportation Needs: No Transportation Needs (10/30/2022)    PRAPARE - Therapist, art (Medical): No     Lack of Transportation (Non-Medical): No   Substance Use: Not on file   Health Literacy: Not on file   Physical Activity: Not on file   Interpersonal Safety: Not on file   Stress: Not on file   Intimate Partner Violence: Not on file   Depression: Not on file   Social Connections: Not on file       Complex Discharge Information    Is patient identified as a difficult/complex discharge?: No       Discharge Needs Assessment  Concerns to be Addressed: care coordination/care conferences, discharge planning    Clinical Risk Factors: > 65, Multiple Diagnoses (Chronic)    Barriers to taking medications: No    Prior overnight hospital stay or ED visit in last 90 days: No       Anticipated Changes Related to Illness: none    Equipment Needed After Discharge: other (see comments) (Pending Tratment and PT/OT evaluations)    Discharge Facility/Level of Care Needs: other (see comments) (Home possibly with services)    Readmission  Risk of Unplanned Readmission Score: UNPLANNED READMISSION SCORE: 19.74%  Predictive Model Details          20% (Medium)  Factor Value    Calculated 10/30/2022 16:03 21% Number of active inpatient medication orders 32    [place] Risk of Unplanned Readmission Model 9%  ECG/EKG order present in last 6 months     9% Charlson Comorbidity Index 8     8% Latest BUN high (32 mg/dL)     7% Diagnosis of electrolyte disorder present     6% Imaging order present in last 6 months     6% Age 77     6% Phosphorous result present     5% Number of ED visits in last six months 1     4% Diagnosis of deficiency anemia present     4% Active anticoagulant inpatient medication order present     4% Active corticosteroid inpatient medication order present     4% Latest creatinine high (1.82 mg/dL)     3% Current length of stay 2.998 days     2% Future appointment scheduled     1% Active ulcer inpatient medication order present      Readmitted Within the Last 30 Days? (No if blank)   Patient at risk for readmission?: No    Discharge Plan  Screen findings are: Discharge planning needs identified or anticipated (Comment). (Pending Treatment and PT/OT evaluations)    Expected Discharge Date: 11/03/2022    Initial Assessment complete?: Yes    Rich Number, RN, Care Manager  October 30, 2022 4:20 PM

## 2022-10-31 LAB — HEPATIC FUNCTION PANEL
ALBUMIN: 2.4 g/dL — ABNORMAL LOW (ref 3.4–5.0)
ALBUMIN: 2.5 g/dL — ABNORMAL LOW (ref 3.4–5.0)
ALKALINE PHOSPHATASE: 680 U/L — ABNORMAL HIGH (ref 46–116)
ALKALINE PHOSPHATASE: 821 U/L — ABNORMAL HIGH (ref 46–116)
ALT (SGPT): 175 U/L — ABNORMAL HIGH (ref 10–49)
ALT (SGPT): 239 U/L — ABNORMAL HIGH (ref 10–49)
AST (SGOT): 311 U/L — ABNORMAL HIGH (ref <=34)
AST (SGOT): 448 U/L — ABNORMAL HIGH (ref <=34)
BILIRUBIN DIRECT: 3 mg/dL — ABNORMAL HIGH (ref 0.00–0.30)
BILIRUBIN DIRECT: 3.9 mg/dL — ABNORMAL HIGH (ref 0.00–0.30)
BILIRUBIN TOTAL: 3.9 mg/dL — ABNORMAL HIGH (ref 0.3–1.2)
BILIRUBIN TOTAL: 4.7 mg/dL — ABNORMAL HIGH (ref 0.3–1.2)
PROTEIN TOTAL: 5.7 g/dL (ref 5.7–8.2)
PROTEIN TOTAL: 5.9 g/dL (ref 5.7–8.2)

## 2022-10-31 LAB — BASIC METABOLIC PANEL
ANION GAP: 6 mmol/L (ref 5–14)
ANION GAP: 7 mmol/L (ref 5–14)
BLOOD UREA NITROGEN: 27 mg/dL — ABNORMAL HIGH (ref 9–23)
BLOOD UREA NITROGEN: 29 mg/dL — ABNORMAL HIGH (ref 9–23)
BUN / CREAT RATIO: 17
BUN / CREAT RATIO: 20
CALCIUM: 8.6 mg/dL — ABNORMAL LOW (ref 8.7–10.4)
CALCIUM: 8.4 mg/dL — ABNORMAL LOW (ref 8.7–10.4)
CHLORIDE: 112 mmol/L — ABNORMAL HIGH (ref 98–107)
CHLORIDE: 112 mmol/L — ABNORMAL HIGH (ref 98–107)
CO2: 21 mmol/L (ref 20.0–31.0)
CO2: 21 mmol/L (ref 20.0–31.0)
CREATININE: 1.57 mg/dL — ABNORMAL HIGH
CREATININE: 1.46 mg/dL — ABNORMAL HIGH
EGFR CKD-EPI (2021) MALE: 46 mL/min/{1.73_m2} — ABNORMAL LOW (ref >=60)
EGFR CKD-EPI (2021) MALE: 50 mL/min/{1.73_m2} — ABNORMAL LOW (ref >=60)
GLUCOSE RANDOM: 74 mg/dL (ref 70–179)
GLUCOSE RANDOM: 73 mg/dL (ref 70–179)
POTASSIUM: 4.2 mmol/L (ref 3.4–4.8)
POTASSIUM: 4.2 mmol/L (ref 3.5–5.1)
SODIUM: 139 mmol/L (ref 135–145)
SODIUM: 140 mmol/L (ref 135–145)

## 2022-10-31 LAB — CBC
HEMATOCRIT: 47.6 % (ref 39.0–48.0)
HEMOGLOBIN: 16 g/dL (ref 12.9–16.5)
MEAN CORPUSCULAR HEMOGLOBIN CONC: 33.6 g/dL (ref 32.0–36.0)
MEAN CORPUSCULAR HEMOGLOBIN: 27.7 pg (ref 25.9–32.4)
MEAN CORPUSCULAR VOLUME: 82.4 fL (ref 77.6–95.7)
MEAN PLATELET VOLUME: 9.7 fL (ref 6.8–10.7)
PLATELET COUNT: 94 10*9/L — ABNORMAL LOW (ref 150–450)
RED BLOOD CELL COUNT: 5.78 10*12/L — ABNORMAL HIGH (ref 4.26–5.60)
RED CELL DISTRIBUTION WIDTH: 21.9 % — ABNORMAL HIGH (ref 12.2–15.2)
WBC ADJUSTED: 7.6 10*9/L (ref 3.6–11.2)

## 2022-10-31 LAB — APTT
APTT: 119.3 s — ABNORMAL HIGH (ref 24.8–38.4)
APTT: 104.9 s — ABNORMAL HIGH (ref 24.8–38.4)
HEPARIN CORRELATION: 0.7
HEPARIN CORRELATION: 0.6

## 2022-10-31 LAB — PHOSPHORUS: PHOSPHORUS: 1.3 mg/dL — ABNORMAL LOW (ref 2.4–5.1)

## 2022-10-31 LAB — MAGNESIUM: MAGNESIUM: 1.9 mg/dL (ref 1.6–2.6)

## 2022-10-31 LAB — LIPASE: LIPASE: 83 U/L — ABNORMAL HIGH (ref 12–53)

## 2022-10-31 LAB — HAPTOGLOBIN: HAPTOGLOBIN: 37 mg/dL (ref 30–200)

## 2022-10-31 LAB — HIGH SENSITIVITY TROPONIN I - SINGLE
HIGH SENSITIVITY TROPONIN I: 17 ng/L (ref <=53)
HIGH SENSITIVITY TROPONIN I: 18 ng/L (ref <=53)

## 2022-10-31 LAB — LACTATE DEHYDROGENASE: LACTATE DEHYDROGENASE: 1470 U/L — ABNORMAL HIGH (ref 120–246)

## 2022-10-31 MED ADMIN — carvedilol (COREG) tablet 6.25 mg: 6.25 mg | ORAL | @ 07:00:00

## 2022-10-31 MED ADMIN — nitroglycerin (NITROSTAT) SL tablet 0.4 mg: .4 mg | SUBLINGUAL | @ 05:00:00

## 2022-10-31 MED ADMIN — pantoprazole (Protonix) EC tablet 40 mg: 40 mg | ORAL | @ 12:00:00

## 2022-10-31 MED ADMIN — tamsulosin (FLOMAX) 24 hr capsule 0.4 mg: .4 mg | ORAL | @ 12:00:00

## 2022-10-31 MED ADMIN — nicotine (NICODERM CQ) 21 mg/24 hr patch 1 patch: 1 | TRANSDERMAL | @ 12:00:00

## 2022-10-31 MED ADMIN — heparin 25,000 Units/250 mL (100 units/mL) in 0.45% saline infusion (premade): 0-24 [IU]/kg/h | INTRAVENOUS | @ 01:00:00

## 2022-10-31 MED ADMIN — finasteride (PROSCAR) tablet 5 mg: 5 mg | ORAL | @ 12:00:00

## 2022-10-31 MED ADMIN — levothyroxine (SYNTHROID) tablet 100 mcg: 100 ug | ORAL | @ 09:00:00

## 2022-10-31 MED ADMIN — potassium & sodium phosphates 250mg (PHOS-NAK/NEUTRA PHOS) packet 2 packet: 2 | ORAL | @ 21:00:00 | Stop: 2022-11-02

## 2022-10-31 MED ADMIN — oxyCODONE (ROXICODONE) immediate release tablet 5 mg: 5 mg | ORAL | @ 07:00:00 | Stop: 2022-10-31

## 2022-10-31 MED ADMIN — lidocaine 4 % patch 1 patch: 1 | TRANSDERMAL | @ 13:00:00

## 2022-10-31 MED ADMIN — lactated Ringers infusion: 75 mL/h | INTRAVENOUS | @ 20:00:00 | Stop: 2022-11-01

## 2022-10-31 MED ADMIN — LORazepam (ATIVAN) tablet 0.5 mg: .5 mg | ORAL | @ 12:00:00

## 2022-10-31 MED ADMIN — LORazepam (ATIVAN) tablet 0.5 mg: .5 mg | ORAL | @ 21:00:00

## 2022-10-31 MED ADMIN — nitroglycerin (NITROSTAT) SL tablet 0.4 mg: .4 mg | SUBLINGUAL | @ 07:00:00

## 2022-10-31 MED ADMIN — acetaminophen (TYLENOL) tablet 650 mg: 650 mg | ORAL | @ 02:00:00

## 2022-10-31 MED ADMIN — aspirin chewable tablet 81 mg: 81 mg | ORAL | @ 12:00:00

## 2022-10-31 MED ADMIN — predniSONE (DELTASONE) tablet 10 mg: 10 mg | ORAL | @ 12:00:00

## 2022-10-31 MED ADMIN — potassium & sodium phosphates 250mg (PHOS-NAK/NEUTRA PHOS) packet 1 packet: 1 | ORAL | @ 01:00:00 | Stop: 2022-10-30

## 2022-10-31 MED ADMIN — pregabalin (LYRICA) capsule 75 mg: 75 mg | ORAL | @ 12:00:00

## 2022-10-31 MED ADMIN — escitalopram oxalate (LEXAPRO) tablet 20 mg: 20 mg | ORAL | @ 12:00:00

## 2022-10-31 MED ADMIN — ondansetron (ZOFRAN) injection 4 mg: 4 mg | INTRAVENOUS | @ 06:00:00

## 2022-10-31 MED ADMIN — vancomycin (VANCOCIN) 1250 mg in sodium chloride (NS) 0.9% 250 mL IVPB: 1250 mg | INTRAVENOUS | @ 05:00:00 | Stop: 2022-11-06

## 2022-10-31 MED ADMIN — lactated Ringers infusion: 100 mL/h | INTRAVENOUS | @ 07:00:00 | Stop: 2022-10-31

## 2022-10-31 MED ADMIN — potassium & sodium phosphates 250mg (PHOS-NAK/NEUTRA PHOS) packet 2 packet: 2 | ORAL | @ 17:00:00 | Stop: 2022-11-02

## 2022-10-31 MED ADMIN — mirtazapine (REMERON) tablet 22.5 mg: 22.5 mg | ORAL | @ 01:00:00

## 2022-10-31 MED ADMIN — ezetimibe (ZETIA) tablet 20 mg: 20 mg | ORAL | @ 12:00:00

## 2022-10-31 MED ADMIN — acetaminophen (TYLENOL) tablet 650 mg: 650 mg | ORAL | @ 23:00:00

## 2022-10-31 MED ADMIN — ondansetron (ZOFRAN) injection 4 mg: 4 mg | INTRAVENOUS | @ 12:00:00

## 2022-10-31 MED ADMIN — heparin 25,000 Units/250 mL (100 units/mL) in 0.45% saline infusion (premade): 0-24 [IU]/kg/h | INTRAVENOUS | @ 22:00:00

## 2022-10-31 MED ADMIN — acetaminophen (TYLENOL) tablet 650 mg: 650 mg | ORAL | @ 12:00:00

## 2022-10-31 NOTE — Unmapped (Signed)
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 y/o male that presents to [facility] with Diarrhea.    Patient seen in for consideration of central line placement per request of the primary team.  We have been following along as patient has had difficult PIV placement. Primary team would prefer to avoid central line placement if possible given limited time required to need access. VAT has been able to place two PIVs so no need for central line at this time.    Thank you for this consultation.  We will sign off, please call Med M at [phone] if the clinical situation changes and we will reevaluate.

## 2022-10-31 NOTE — Unmapped (Signed)
Elevated BP, med U informed. Heparin running at 11 units/kg/hr. LR running at 100 ml/hr. Multiple BM, loose. VRE swab done. CP x2, ARRT initiated once. Nitroglycerin given with good effect. Restarted coreg. One time order of Oxy given, effective. Sitter at bedside for safety. Enteric precaution d/c'd. Bed in low locked position. Cal light within reach.     Problem: Adult Inpatient Plan of Care  Goal: Plan of Care Review  Outcome: Ongoing - Unchanged  Goal: Patient-Specific Goal (Individualized)  Outcome: Ongoing - Unchanged  Goal: Absence of Hospital-Acquired Illness or Injury  Outcome: Ongoing - Unchanged  Intervention: Identify and Manage Fall Risk  Recent Flowsheet Documentation  Taken 10/30/2022 2119 by Casimiro Needle, RN  Safety Interventions:   bleeding precautions   bed alarm   fall reduction program maintained   infection management   isolation precautions   lighting adjusted for tasks/safety   low bed   nonskid shoes/slippers when out of bed   commode/urinal/bedpan at bedside  Intervention: Prevent Infection  Recent Flowsheet Documentation  Taken 10/30/2022 2119 by Alizaya Oshea, RN  Infection Prevention:   single patient room provided   hand hygiene promoted  Goal: Optimal Comfort and Wellbeing  Outcome: Ongoing - Unchanged  Goal: Readiness for Transition of Care  Outcome: Ongoing - Unchanged  Goal: Rounds/Family Conference  Outcome: Ongoing - Unchanged     Problem: Infection  Goal: Absence of Infection Signs and Symptoms  Outcome: Ongoing - Unchanged  Intervention: Prevent or Manage Infection  Recent Flowsheet Documentation  Taken 10/30/2022 2119 by Lashai Grosch, RN  Infection Management: aseptic technique maintained  Isolation Precautions: enteric precautions maintained     Problem: Self-Care Deficit  Goal: Improved Ability to Complete Activities of Daily Living  Outcome: Ongoing - Unchanged     Problem: VTE (Venous Thromboembolism)  Goal: Tissue Perfusion  Outcome: Ongoing - Unchanged  Intervention: Optimize Tissue Perfusion  Recent Flowsheet Documentation  Taken 10/30/2022 2119 by Tocara Mennen, RN  Bleeding Precautions: monitored for signs of bleeding  Goal: Right Ventricular Function  Outcome: Ongoing - Unchanged     Problem: Fall Injury Risk  Goal: Absence of Fall and Fall-Related Injury  Outcome: Ongoing - Unchanged  Intervention: Promote Injury-Free Environment  Recent Flowsheet Documentation  Taken 10/30/2022 2119 by Lucy Boardman, RN  Safety Interventions:   bleeding precautions   bed alarm   fall reduction program maintained   infection management   isolation precautions   lighting adjusted for tasks/safety   low bed   nonskid shoes/slippers when out of bed   commode/urinal/bedpan at bedside

## 2022-10-31 NOTE — Unmapped (Signed)
PT alert and oriented X4, needs reminders to be cautious with medical equipment, slightly impulsive when  needs to use bathroom for diarrhea.  IV caught and removed by PT prior to staff being able to respond to bed alarm. VAT able to establish new IV in right forearm. Heparin on hold for a short time r/t concern for blood in urine. Resumed after assessed by Urology and IV access re-established. PT walking and moving around in room and more active in the morning. Less active as the day progressed. Second urine obtained for culture via straight cath, PT tolerated well. Dr. Abundio Miu made aware PT is retaining more than he voids.  PT typically is voiding , when bladder scanned found to have on average 169 ml in his bladder.  Straight cathed for , PT stated he didn't feel like he had to go before catheterization. PT developed mid abdominal pain and nausea with vomiting this afternoon and became less active and verbally interactive. IV Zofran given with good results after PT had some vomiting (not seen by staff).  Second IV established by VAT for maintenance fluids. IV fluids and Heparin infusing as ordered. PNA requested to assist PT and sit at bedside for safety and guard medical equipment because IV access is becoming difficult.  Due to PT's kidney function, concern for future need of dialysis makes use of upper arm veins contraindicated to policy.     PT has been pleasant and cooperative and able to make needs known. Requested nicotine patch, applied.        Problem: Adult Inpatient Plan of Care  Goal: Plan of Care Review  Outcome: Ongoing - Unchanged  Goal: Patient-Specific Goal (Individualized)  Outcome: Ongoing - Unchanged  Goal: Absence of Hospital-Acquired Illness or Injury  Outcome: Ongoing - Unchanged  Intervention: Identify and Manage Fall Risk  Recent Flowsheet Documentation  Taken 10/30/2022 0821 by Dorise Bullion, RN  Safety Interventions:   nonskid shoes/slippers when out of bed   low bed lighting adjusted for tasks/safety   fall reduction program maintained  Taken 10/30/2022 0749 by Dorise Bullion, RN  Safety Interventions:   aspiration precautions   fall reduction program maintained   lighting adjusted for tasks/safety   low bed   nonskid shoes/slippers when out of bed  Intervention: Prevent Skin Injury  Recent Flowsheet Documentation  Taken 10/30/2022 1300 by Dorise Bullion, RN  Positioning for Skin: Bed in Chair  Device Skin Pressure Protection: absorbent pad utilized/changed  Skin Protection:   adhesive use limited   incontinence pads utilized  Taken 10/30/2022 0821 by Dorise Bullion, RN  Skin Protection:   adhesive use limited   incontinence pads utilized  Taken 10/30/2022 0749 by Dorise Bullion, RN  Positioning for Skin: Supine/Back  Device Skin Pressure Protection: absorbent pad utilized/changed  Skin Protection:   adhesive use limited   incontinence pads utilized  Intervention: Prevent and Manage VTE (Venous Thromboembolism) Risk  Recent Flowsheet Documentation  Taken 10/30/2022 0749 by Dorise Bullion, RN  Anti-Embolism Intervention: Refused  Goal: Optimal Comfort and Wellbeing  Outcome: Ongoing - Unchanged  Goal: Readiness for Transition of Care  Outcome: Ongoing - Unchanged  Goal: Rounds/Family Conference  Outcome: Ongoing - Unchanged     Problem: Infection  Goal: Absence of Infection Signs and Symptoms  Outcome: Ongoing - Unchanged  Intervention: Prevent or Manage Infection  Recent Flowsheet Documentation  Taken 10/30/2022 0749 by Dorise Bullion, RN  Isolation Precautions: enteric precautions maintained     Problem: Self-Care Deficit  Goal: Improved Ability  to Complete Activities of Daily Living  Outcome: Ongoing - Unchanged     Problem: VTE (Venous Thromboembolism)  Goal: Tissue Perfusion  Outcome: Ongoing - Unchanged  Intervention: Optimize Tissue Perfusion  Recent Flowsheet Documentation  Taken 10/30/2022 0749 by Dorise Bullion, RN  Anti-Embolism Intervention: Refused  Goal: Right Ventricular Function  Outcome: Ongoing - Unchanged     Problem: Fall Injury Risk  Goal: Absence of Fall and Fall-Related Injury  Outcome: Ongoing - Unchanged  Intervention: Promote Injury-Free Environment  Recent Flowsheet Documentation  Taken 10/30/2022 1610 by Dorise Bullion, RN  Safety Interventions:   nonskid shoes/slippers when out of bed   low bed   lighting adjusted for tasks/safety   fall reduction program maintained  Taken 10/30/2022 0749 by Dorise Bullion, RN  Safety Interventions:   aspiration precautions   fall reduction program maintained   lighting adjusted for tasks/safety   low bed   nonskid shoes/slippers when out of bed

## 2022-10-31 NOTE — Unmapped (Signed)
Vancomycin Therapeutic Monitoring Pharmacy Note    Alias Grahn is a 77 y.o. male starting vancomycin. Date of therapy initiation: 10/30/22    Indication: Urinary Tract Infection (UTI)    Prior Dosing Information: None/new initiation     Goals:  Therapeutic Drug Levels  Vancomycin trough goal: 10-15 mg/L    Additional Clinical Monitoring/Outcomes  Renal function, volume status (intake and output)    Results: Not applicable    Wt Readings from Last 1 Encounters:   10/28/22 (!) 101.5 kg (223 lb 12.8 oz)     Creatinine   Date Value Ref Range Status   10/30/2022 1.82 (H) 0.73 - 1.18 mg/dL Final   16/05/9603 5.40 (H) 0.73 - 1.18 mg/dL Final   98/06/9146 8.29 (H) 0.73 - 1.18 mg/dL Final        Pharmacokinetic Considerations and Significant Drug Interactions:  Adult (estimated initial): Vd = 72 L, ke = 0.034 hr-1  Concurrent nephrotoxic meds: not applicable    Assessment/Plan:  Recommendation(s)  Start vancomycin 1250 mg IV q24h  Estimated trough on recommended regimen:  14 mg/L    Follow-up  Level due: prior to fourth or fifth dose  A pharmacist will continue to monitor and order levels as appropriate    Please page service pharmacist with questions/clarifications.    Jahaziel Francois Verdell Carmine, [facility]

## 2022-10-31 NOTE — Unmapped (Signed)
Pt alert and oriented x4. Safety sitter at bedside for impulsiveness and fall risk. Fall precautions in place. Heparin gtt infusing. Belongings and call light within reach.   Problem: Adult Inpatient Plan of Care  Goal: Plan of Care Review  Outcome: Progressing  Goal: Patient-Specific Goal (Individualized)  Outcome: Progressing  Goal: Absence of Hospital-Acquired Illness or Injury  Outcome: Progressing  Goal: Optimal Comfort and Wellbeing  Outcome: Progressing  Goal: Readiness for Transition of Care  Outcome: Progressing  Goal: Rounds/Family Conference  Outcome: Progressing     Problem: Infection  Goal: Absence of Infection Signs and Symptoms  Outcome: Progressing     Problem: Self-Care Deficit  Goal: Improved Ability to Complete Activities of Daily Living  Outcome: Progressing     Problem: VTE (Venous Thromboembolism)  Goal: Tissue Perfusion  Outcome: Progressing  Goal: Right Ventricular Function  Outcome: Progressing     Problem: Fall Injury Risk  Goal: Absence of Fall and Fall-Related Injury  Outcome: Progressing

## 2022-10-31 NOTE — Unmapped (Signed)
MRI Screening Form needs to be completed, please fill out all questions. If pt is non-verbal, please contact immediate family member to help fill out questions and list their name and number on the form.     Pt must be changed into a gown, medication patches be removed and EKG leads taken off prior to coming to MRI. IV should be med locked or have 30 feet of extension on.   Thank you. If you have any questions, please call MRI at [phone].

## 2022-10-31 NOTE — Unmapped (Signed)
Internal Medicine (MEDU) Progress Note    Assessment & Plan:   Christopher Brennan is a 77 y.o. 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 *        Active Problems    Liver Masses c/f metastatic disease  Patient with transaminitis with CT findings concerning for metastatic disease involving numerous liver masses; spleen and lung involvement with enlarged periportal lymph nodes. Gallbladder normal and no ductal dilation on initial imaging although LFTs/Bili continues to uptrend this hospital stay raising concerns for biliary obstruction likely related to mass effect.   - Consulted VIR for biopsy, liver biopsy planned for 3/13   - Consulted cardiology per VIR request for management of periprocedural management of clopidogrel/apixaban   - Agree with ASA 81 mg and heparin gtt while inpatient during clopidogrel/apixaban washout period given extend of his cardiovascular disease and recent stenting in 05/2022. Plan to resume plavix as soon as feasible post-procedure.  - Hold heparin gtt 4 hours prior to procedure per VIR    Transaminitis/Hyperbilirubinemia  As above. Plan for MRCP and consultation with biliary service.    -Trend LFTs, PT-INR    Diarrhea  Presenting complaint complicated by hypovolemia and dehydration. Patient continues with diarrhea although improving some today. Present for at least 3 weeks now. GI pathogen panel and C. difficile negative. CT AP without obvious inflammatory changes although noted fatty infiltration of the ascending and transverse colonic wall, most likely related to sequelae of chronic inflammation.  If no resolution will need a broader work up for chronic diarrhea including C-scope. No colonoscopy records in chart.  - Continue mIVF  - Start imodium BID prn  --check Thyroid studies - free T4 given panhypopit  --further work up if no resolution    RUQ Pain  Ongoing RUQ, acutely worse night of 3/12 with radiation to chest. Evaluated for ACS given cardiac history and improvement with SLN although EKG unchanged from prior and troponin x 2 negative. Ultimately concern remains for pain associated with his liver pathology as above.   -see above for work up.    AKI on CKD 3b (GFR 30-45)  Patient with significantly improved renal function now at 1.46 (BL 1.5-1.7) after IV fluids. He is followed by outpatient nephrology. Likely pre-renal in the setting of volume depletion. Will continue current management as below:  - Daily BMP  - Decrease mIVF to 64ml/hr iso LE swelling  - Renally dose meds  - Avoid nephrotoxins (including NSAIDs, contrasted studies, fleets enemas)    Non anion gap metabolic acidosis  Likely in the setting of bicarbonate loss due to diarrhea.  Will continue to monitor labs.    Thrombocytopenia  Presented with mild decrease in platelets to 114 on admission. Currently stable in the 90s. Lower concern for HIT as he was already low on presentation. No evidence of hemolysis given normal haptoglobin (LDH significantly elevated although suspect this may be due to malignancy). B12/Folate normal.  -monitor for now; further work up if ongoing downtrend    Elevated Lactate (resolved) - Hypovolemia  Elevated lactate resolved after IV fluids. Elevated lactate most likely in the setting of hypovolemia secondary to diarrhea.  Less suspicion for urosepsis given patient is asymptomatic and urine culture results are not significant.  Will proceed with the following:  - IV fluids  - Follow Bcx - NGTD    Hypophosphatemia  Monitoring and repleating.    Hypertension  At home on Coreg 12.5BID and Amlodipine 5QD. Initially held in setting on hypotension.  -Coreg restarted 3/12 for uptrending Bps  -will plan to add Amlodipine next if needed.    Urine Culture w/ E. faecalis  Urine culture with 10-50k colonies of Enterococcus faecalis; given immunosuppressed state (on Prednisone and Cellcept), bladder outlet obstruction with intermittent catheterization, and recent hematuria he will complete 7 days of treatment (s/p 3 days of Zosyn now transitioned to Vancomycin).  -continue Vancomycin until 3/14; renally dosed q24h  -have requested sensitivities from micro lab     Upper Lobe Mass R Lung  Patient with known tobacco use history. 1ppd since teenager. CT scan showing 4.3 cm posterior RUL mass with multiple nodules. Unclear if this could be primary site.  -- Liver biopsy as above for further workup     Irregular Thickening of Bladder Wall   Concerning for another possible source of malignancy, as bladder cancer can spread to liver and lung. Of note, patient had various episodes of gross hematuria, although U/A showed a decrease in RBCs from prior sample at admission. Urology consulted, appreciate evaluation and recommendations. Stated that they would proceed with a bladder scan to make sure he is not retaining and urine studies first as the rest of the workup can be done in the outpatient setting, including cystoscopy. They would recommend proceeding first with the liver biopsy as already planned.  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 Currently on thyroid and adrenal replacement.   -continue Prednisone 10 mg daily - (patient will need stress dose steroids if acute decompensation/sepsis/surgery)  -continue home Levothyroxine 100 mcg daily     Aortic Aneurysm 4.6 cm - Dissection Common Illiac Artery  Was seen in cardiology clinic 2/8 and had CTA done showing increased abdominal aortic aneurysm up to 4.6 cm and new short segment dissection flap in R common iliac artery, with unchanged dissection in L common iliac artery.   -- Vascular surgery referral for the outpatient setting    LE swelling, bilateral   Patient with bilateral LE swelling on 3/12, in the setting of fluid resuscitation for AKI. Due to extensive CAD hx, c/f DVT.   - LE doppler   - Continue heparin gtt, restart home plavix and eliquis post-procedure  - Decrease mIVF to 75 ml/hr     CAD s/p PCI to  distal LAD and OM1 in 05/2022  Severe CAD with recent intervention as above. On Plavix alongside eliquis as below. on zetia, evolocumab. Restart plavix after procedure.    DVT hx: On eliquis at home, heparin drip in preparation for possible biopsy    CIDP: Holding Cellcept, also treated with IVIG in the outpatient setting. Followed by neurology.  -Cellcept on hold here while treated for infection    Restless leg: Lyrica 150 or 75 mg, unclear dosing.     IgG-lambda monoclonal gammopathy/MGUS    - Follows with Dr. Barbette Merino. Under evaluation for possible POEMS given associated polyneuropathy although this has been felt to be less likely.    HLD  Resume Zetia; evolocumab not on formulary.    Issues Impacting Complexity of Management:  The patient's presentation is complicated by the following clinically significant conditions requiring additional evaluation and treatment: - Hypercoagulable state requiring additional attention to DVT prophylaxis and treatment  - Chronic kidney disease POA requiring further investigation, treatment, or monitoring   - Volume depletion POA requiring further investigation, treatment, or monitoring  - Metastatic cancer POA requiring further investigation, treatment, or monitoring         Issues Impacting Complexity of Management:  -Discussion of risks/benefits of biopsy of lung or liver  with the patient including risk of risk factor: hemorrhage, infection, and medical complications.        Medical Decision Making: Reviewed records from the following unique sources  outpatient heme/onc, neurology, cardiology visit.      Daily Checklist:  Diet: Regular Diet  DVT PPx:  Heparin gtt while admitted  Code Status: Full Code  Dispo:  Continue current level of care    Team Contact Information:   Primary Team: Internal Medicine (MEDU)  Primary Resident: Sherrie George, MD  Resident's Pager: [phone] (Gen MedU Intern - Tower)      Interval History:   Overnight patient complaining of sharp chest pain as well as RUQ pain. ACS workup unremarkable with normal EKG, troponin 17 ->18. Chest pain significantly improved s/p SL nitroglycerin. This AM patient states chest pain is fully resolved but continues to have RUQ pain and nausea. He states he feels off.     He also had BS with and voided only . S/p straight cath with out.     ROS: Denies headache, chest pain, shortness of breath, abdominal pain, nausea, vomiting.    Objective:   Temp:  [36.6 ??C (97.9 ??F)-36.7 ??C (98.1 ??F)] 36.6 ??C (97.9 ??F)  Heart Rate:  [64-150] 150  Resp:  [18-24] 18  BP: (119-201)/(71-103) 119/83  SpO2:  [96 %-98 %] 96 %    Gen: NAD, converses   HENT: atraumatic, normocephalic  Heart: RRR  Lungs: CTAB, no crackles or wheezes  Abdomen: soft, non-distended. RUQ tenderness  Skin:  scattered crustly skin lesions, known skin cancer per patient   Extremities: 1+ Bilateral LE edema   GU: Urethral meatus without blood or discharge    Gershon Cull, DO  PGY-1 Anesthesiology

## 2022-11-01 DIAGNOSIS — R319 Hematuria, unspecified: Principal | ICD-10-CM

## 2022-11-01 LAB — CBC
HEMATOCRIT: 46.9 % (ref 39.0–48.0)
HEMOGLOBIN: 15.2 g/dL (ref 12.9–16.5)
MEAN CORPUSCULAR HEMOGLOBIN CONC: 32.5 g/dL (ref 32.0–36.0)
MEAN CORPUSCULAR HEMOGLOBIN: 27.3 pg (ref 25.9–32.4)
MEAN CORPUSCULAR VOLUME: 84 fL (ref 77.6–95.7)
MEAN PLATELET VOLUME: 9.8 fL (ref 6.8–10.7)
PLATELET COUNT: 79 10*9/L — ABNORMAL LOW (ref 150–450)
RED BLOOD CELL COUNT: 5.58 10*12/L (ref 4.26–5.60)
RED CELL DISTRIBUTION WIDTH: 22.6 % — ABNORMAL HIGH (ref 12.2–15.2)
WBC ADJUSTED: 7.6 10*9/L (ref 3.6–11.2)

## 2022-11-01 LAB — HEPATIC FUNCTION PANEL
ALBUMIN: 2.6 g/dL — ABNORMAL LOW (ref 3.4–5.0)
ALKALINE PHOSPHATASE: 684 U/L — ABNORMAL HIGH (ref 46–116)
ALT (SGPT): 239 U/L — ABNORMAL HIGH (ref 10–49)
AST (SGOT): 412 U/L — ABNORMAL HIGH (ref <=34)
BILIRUBIN DIRECT: 5.2 mg/dL — ABNORMAL HIGH (ref 0.00–0.30)
BILIRUBIN TOTAL: 6.5 mg/dL — ABNORMAL HIGH (ref 0.3–1.2)
PROTEIN TOTAL: 5.8 g/dL (ref 5.7–8.2)

## 2022-11-01 LAB — ADDON DIFFERENTIAL ONLY
BASOPHILS ABSOLUTE COUNT: 0.1 10*9/L (ref 0.0–0.1)
BASOPHILS RELATIVE PERCENT: 1.1 %
EOSINOPHILS ABSOLUTE COUNT: 0.2 10*9/L (ref 0.0–0.5)
EOSINOPHILS RELATIVE PERCENT: 2.3 %
LYMPHOCYTES ABSOLUTE COUNT: 1.1 10*9/L (ref 1.1–3.6)
LYMPHOCYTES RELATIVE PERCENT: 13.8 %
MONOCYTES ABSOLUTE COUNT: 0.5 10*9/L (ref 0.3–0.8)
MONOCYTES RELATIVE PERCENT: 5.9 %
NEUTROPHILS ABSOLUTE COUNT: 6.1 10*9/L (ref 1.8–7.8)
NEUTROPHILS RELATIVE PERCENT: 76.9 %

## 2022-11-01 LAB — BASIC METABOLIC PANEL
ANION GAP: 7 mmol/L (ref 5–14)
BLOOD UREA NITROGEN: 31 mg/dL — ABNORMAL HIGH (ref 9–23)
BUN / CREAT RATIO: 18
CALCIUM: 8.3 mg/dL — ABNORMAL LOW (ref 8.7–10.4)
CHLORIDE: 111 mmol/L — ABNORMAL HIGH (ref 98–107)
CO2: 21 mmol/L (ref 20.0–31.0)
CREATININE: 1.73 mg/dL — ABNORMAL HIGH
EGFR CKD-EPI (2021) MALE: 41 mL/min/{1.73_m2} — ABNORMAL LOW (ref >=60)
GLUCOSE RANDOM: 67 mg/dL — ABNORMAL LOW (ref 70–179)
POTASSIUM: 4.4 mmol/L (ref 3.4–4.8)
SODIUM: 139 mmol/L (ref 135–145)

## 2022-11-01 LAB — APTT
APTT: 44 s — ABNORMAL HIGH (ref 24.8–38.4)
APTT: 42.8 s — ABNORMAL HIGH (ref 24.8–38.4)
HEPARIN CORRELATION: 0.3
HEPARIN CORRELATION: 0.2

## 2022-11-01 LAB — T4, FREE: FREE T4: 0.94 ng/dL (ref 0.89–1.76)

## 2022-11-01 LAB — MAGNESIUM: MAGNESIUM: 2.4 mg/dL (ref 1.6–2.6)

## 2022-11-01 LAB — TSH: THYROID STIMULATING HORMONE: 0.031 u[IU]/mL — ABNORMAL LOW (ref 0.550–4.780)

## 2022-11-01 LAB — PHOSPHORUS: PHOSPHORUS: 1.2 mg/dL — ABNORMAL LOW (ref 2.4–5.1)

## 2022-11-01 MED ADMIN — lactated Ringers infusion: 75 mL/h | INTRAVENOUS | @ 11:00:00 | Stop: 2022-11-01

## 2022-11-01 MED ADMIN — ezetimibe (ZETIA) tablet 20 mg: 20 mg | ORAL | @ 13:00:00

## 2022-11-01 MED ADMIN — fentaNYL (PF) (SUBLIMAZE) injection: INTRAVENOUS | @ 19:00:00 | Stop: 2022-11-01

## 2022-11-01 MED ADMIN — amlodipine (NORVASC) tablet 5 mg: 5 mg | ORAL | @ 14:00:00

## 2022-11-01 MED ADMIN — loperamide (IMODIUM) capsule 2 mg: 2 mg | ORAL | @ 14:00:00

## 2022-11-01 MED ADMIN — pantoprazole (Protonix) EC tablet 40 mg: 40 mg | ORAL | @ 13:00:00

## 2022-11-01 MED ADMIN — levothyroxine (SYNTHROID) tablet 100 mcg: 100 ug | ORAL | @ 10:00:00

## 2022-11-01 MED ADMIN — lidocaine 4 % patch 1 patch: 1 | TRANSDERMAL | @ 13:00:00

## 2022-11-01 MED ADMIN — magnesium sulfate 2gm/50mL IVPB: 2 g | INTRAVENOUS | @ 06:00:00 | Stop: 2022-11-01

## 2022-11-01 MED ADMIN — nicotine (NICODERM CQ) 21 mg/24 hr patch 1 patch: 1 | TRANSDERMAL | @ 13:00:00

## 2022-11-01 MED ADMIN — LORazepam (ATIVAN) tablet 0.5 mg: .5 mg | ORAL | @ 22:00:00

## 2022-11-01 MED ADMIN — midazolam (VERSED) injection: INTRAVENOUS | @ 18:00:00 | Stop: 2022-11-01

## 2022-11-01 MED ADMIN — lidocaine (PF) (XYLOCAINE-MPF) 10 mg/mL (1 %) injection: INTRADERMAL | @ 19:00:00 | Stop: 2022-11-01

## 2022-11-01 MED ADMIN — mirtazapine (REMERON) tablet 22.5 mg: 22.5 mg | ORAL | @ 02:00:00

## 2022-11-01 MED ADMIN — carvedilol (COREG) tablet 12.5 mg: 12.5 mg | ORAL | @ 13:00:00

## 2022-11-01 MED ADMIN — vancomycin (VANCOCIN) 1250 mg in sodium chloride (NS) 0.9% 250 mL IVPB: 1250 mg | INTRAVENOUS | @ 03:00:00 | Stop: 2022-11-06

## 2022-11-01 MED ADMIN — potassium & sodium phosphates 250mg (PHOS-NAK/NEUTRA PHOS) packet 2 packet: 2 | ORAL | @ 02:00:00 | Stop: 2022-11-02

## 2022-11-01 MED ADMIN — escitalopram oxalate (LEXAPRO) tablet 20 mg: 20 mg | ORAL | @ 13:00:00

## 2022-11-01 MED ADMIN — potassium & sodium phosphates 250mg (PHOS-NAK/NEUTRA PHOS) packet 2 packet: 2 | ORAL | @ 16:00:00 | Stop: 2022-11-02

## 2022-11-01 MED ADMIN — predniSONE (DELTASONE) tablet 10 mg: 10 mg | ORAL | @ 13:00:00

## 2022-11-01 MED ADMIN — sodium phosphate 30 mmol in dextrose 5 % 250 mL IVPB: 30 mmol | INTRAVENOUS | @ 16:00:00 | Stop: 2022-11-01

## 2022-11-01 MED ADMIN — aspirin chewable tablet 81 mg: 81 mg | ORAL | @ 13:00:00

## 2022-11-01 MED ADMIN — tamsulosin (FLOMAX) 24 hr capsule 0.4 mg: .4 mg | ORAL | @ 13:00:00

## 2022-11-01 MED ADMIN — carvedilol (COREG) tablet 6.25 mg: 6.25 mg | ORAL | @ 02:00:00

## 2022-11-01 MED ADMIN — gadobenate dimeglumine (MULTIHANCE) 529 mg/mL (0.1mmol/0.2mL) solution 10 mL: 10 mL | INTRAVENOUS | @ 02:00:00 | Stop: 2022-10-31

## 2022-11-01 MED ADMIN — heparin 25,000 Units/250 mL (100 units/mL) in 0.45% saline infusion (premade): 0-24 [IU]/kg/h | INTRAVENOUS | @ 13:00:00

## 2022-11-01 MED ADMIN — potassium & sodium phosphates 250mg (PHOS-NAK/NEUTRA PHOS) packet 2 packet: 2 | ORAL | @ 10:00:00 | Stop: 2022-11-02

## 2022-11-01 MED ADMIN — potassium & sodium phosphates 250mg (PHOS-NAK/NEUTRA PHOS) packet 2 packet: 2 | ORAL | @ 22:00:00 | Stop: 2022-11-02

## 2022-11-01 MED ADMIN — midazolam (VERSED) injection: INTRAVENOUS | @ 19:00:00 | Stop: 2022-11-01

## 2022-11-01 MED ADMIN — LORazepam (ATIVAN) tablet 0.5 mg: .5 mg | ORAL | @ 13:00:00

## 2022-11-01 MED ADMIN — finasteride (PROSCAR) tablet 5 mg: 5 mg | ORAL | @ 13:00:00

## 2022-11-01 MED ADMIN — pregabalin (LYRICA) capsule 75 mg: 75 mg | ORAL | @ 13:00:00

## 2022-11-01 MED ADMIN — sodium chloride (NS) 0.9 % infusion: INTRAVENOUS | @ 18:00:00 | Stop: 2022-11-01

## 2022-11-01 NOTE — Unmapped (Signed)
Internal Medicine (MEDU) Progress Note    Assessment & Plan:   Christopher Brennan is a 77 y.o. 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 *        Active Problems    Liver Masses c/f metastatic disease  Patient with transaminitis with CT findings concerning for metastatic disease involving numerous liver masses; spleen and lung involvement with enlarged periportal lymph nodes. Gallbladder normal and no ductal dilation on initial imaging although LFTs/Bili continues to uptrend this hospital stay raising concerns for biliary obstruction likely related to mass effect.   - VIR for liver biopsy today   - Follow up VIR on timing to resume anticoagulation   - Plan to resume plavix and eliquis as soon as feasible post-procedure.  - Hold heparin gtt prior to procedure    Transaminitis/Hyperbilirubinemia  As above. Biliary GI consulted and MRCP performed on 3/12 showing no evidence of biliary duct dilation, stones or biliary obstruction. Gallbladder wall edema and periportal edema likely secondary to underlying liver dx.   -Continue to trend LFTs, PT-INR    Diarrhea  Presenting complaint complicated by hypovolemia and dehydration. Patient continues with diarrhea although improving some today. Present for at least 3 weeks now. GI pathogen panel and C. difficile negative. CT AP without obvious inflammatory changes although noted fatty infiltration of the ascending and transverse colonic wall, most likely related to sequelae of chronic inflammation.  If no resolution will need a broader work up for chronic diarrhea including C-scope. No colonoscopy records in chart. T4/TSH normal. Consider neuroendocrine tumor vs other infectious etiologies.   - Continue mIVF  - Schedule imodium BID   - Further workup if no resolution     RUQ Pain  Ongoing RUQ, acutely worse night of 3/12 with radiation to chest. Evaluated for ACS given cardiac history and improvement with SLN although EKG unchanged from prior and troponin x 2 negative. Ultimately concern remains for pain associated with his liver pathology as above.   -see above for work up.    AKI on CKD 3b (GFR 30-45)  Patient with significantly improved renal function now at 1.46 (BL 1.5-1.7) after IV fluids. He is followed by outpatient nephrology. Likely pre-renal in the setting of volume depletion. Will continue current management as below:  - Daily BMP  - mIVF 45ml/hr iso LE swelling  - Renally dose meds  - Avoid nephrotoxins (including NSAIDs, contrasted studies, fleets enemas)    Ventricular Tachycardia   Episode of 5 beats of VT noted on telemetry ON on 3/13. Patient asymptomatic and HDS. No further episodes today. Mg 2.4, Phos  low at 1.2, K 4.4. s/p IV Mg.   - Continue telemetry  - Replete Phos - NaPhos 30 mmol IV    Thrombocytopenia  Presented with mild decrease in platelets to 114 on admission. Currently stable in the 90s. Lower concern for HIT as he was already low on presentation. No evidence of hemolysis given normal haptoglobin (LDH significantly elevated although suspect this may be due to malignancy). B12/Folate normal.  -monitor for now; further work up if ongoing downtrend    Elevated Lactate (resolved) - Hypovolemia  Elevated lactate resolved after IV fluids. Elevated lactate most likely in the setting of hypovolemia secondary to diarrhea.  Less suspicion for urosepsis given patient is asymptomatic and urine culture results are not significant.  Will proceed with the following:  - IV fluids  - Follow Bcx - NGTD    Hypophosphatemia  Monitoring and repleating.    Hypertension  At home on Coreg 12.5BID and Amlodipine 5QD. Initially held in setting on hypotension.  -Coreg restarted 3/12 for uptrending Bps  -Home Amlodipine added 3/13    Urine Culture w/ E. faecalis  Urine culture with 10-50k colonies of Enterococcus faecalis; given immunosuppressed state (on Prednisone and Cellcept), bladder outlet obstruction with intermittent catheterization, and recent hematuria he will complete 7 days of treatment (s/p 3 days of Zosyn now transitioned to Vancomycin).  -Continue Vancomycin until 3/14; renally dosed q24h     Upper Lobe Mass R Lung  Patient with known tobacco use history. 1ppd since teenager. CT scan showing 4.3 cm posterior RUL mass with multiple nodules. Unclear if this could be primary site.  -- Liver biopsy as above for further workup     Irregular Thickening of Bladder Wall   Concerning for another possible source of malignancy, as bladder cancer can spread to liver and lung. Of note, patient had various episodes of gross hematuria, although U/A showed a decrease in RBCs from prior sample at admission. Urology consulted, appreciate evaluation and recommendations. Stated that they would proceed with a bladder scan to make sure he is not retaining and urine studies first as the rest of the workup can be done in the outpatient setting, including cystoscopy. They would recommend proceeding first with the liver biopsy as already planned.  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 Currently on thyroid and adrenal replacement.   -continue Prednisone 10 mg daily - (patient will need stress dose steroids if acute decompensation/sepsis/surgery)  -continue home Levothyroxine 100 mcg daily     Aortic Aneurysm 4.6 cm - Dissection Common Illiac Artery  Was seen in cardiology clinic 2/8 and had CTA done showing increased abdominal aortic aneurysm up to 4.6 cm and new short segment dissection flap in R common iliac artery, with unchanged dissection in L common iliac artery.   -- Vascular surgery referral for the outpatient setting    LE swelling, bilateral   Patient with bilateral LE swelling on 3/12, in the setting of fluid resuscitation for AKI. Due to extensive CAD hx, c/f DVT.   - LE PVL  - Continue heparin gtt, restart home plavix and eliquis post-procedure  - mIVF to 75 ml/hr  CAD s/p PCI to distal LAD and OM1 in 05/2022  Severe CAD with recent intervention as above. On Plavix alongside eliquis as below. on zetia, evolocumab. Restart plavix after procedure.    DVT hx: On eliquis at home, heparin drip in preparation for possible biopsy    CIDP: Holding Cellcept, also treated with IVIG in the outpatient setting. Followed by neurology.  -Cellcept on hold here while treated for infection    Restless leg: Lyrica 150 or 75 mg, unclear dosing.     IgG-lambda monoclonal gammopathy/MGUS    - Follows with Dr. Barbette Merino. Under evaluation for possible POEMS given associated polyneuropathy although this has been felt to be less likely.    HLD  Resume Zetia; evolocumab not on formulary.    Issues Impacting Complexity of Management:  The patient's presentation is complicated by the following clinically significant conditions requiring additional evaluation and treatment: - Hypercoagulable state requiring additional attention to DVT prophylaxis and treatment  - Chronic kidney disease POA requiring further investigation, treatment, or monitoring   - Volume depletion POA requiring further investigation, treatment, or monitoring  - Metastatic cancer POA requiring further investigation, treatment, or monitoring         Issues Impacting Complexity of Management:  -Discussion of risks/benefits of biopsy of lung or liver  with the patient including risk of risk factor: hemorrhage, infection, and medical complications.        Medical Decision Making: Reviewed records from the following unique sources  outpatient heme/onc, neurology, cardiology visit.      Daily Checklist:  Diet: Regular Diet  DVT PPx:  Heparin gtt while admitted  Code Status: Full Code  Dispo:  Continue current level of care    Team Contact Information:   Primary Team: Internal Medicine (MEDU)  Primary Resident: Sherrie George, MD  Resident's Pager: [phone] (Gen MedU Intern - Tower)      Interval History:   Overnight patient has episode of 5 beats of Vtach. He reports no chest pain or dizziness. He continues to have RUQ pain. Continues to have diarrhea, reports 5 loose stools yesterday.     ROS: Denies headache, chest pain, shortness of breath, abdominal pain, nausea, vomiting.    Objective:   Temp:  [35.6 ??C (96 ??F)-36.7 ??C (98.1 ??F)] 36.7 ??C (98.1 ??F)  Heart Rate:  [66-150] 71  Resp:  [12-19] 12  BP: (103-176)/(69-91) 120/75  SpO2:  [91 %-98 %] 96 %    Gen: NAD, converses   HENT: atraumatic, normocephalic  Heart: RRR  Lungs: CTAB, no crackles or wheezes  Abdomen: soft, non-distended. RUQ tenderness to palpation.   Skin:  scattered crustly skin lesions, known skin cancer per patient   Extremities: 1+ Bilateral LE edema   GU: Urethral meatus without blood or discharge    Gershon Cull, DO  PGY-1 Anesthesiology

## 2022-11-01 NOTE — Unmapped (Addendum)
Pt is A&Ox2. Denies pain. Sitter at bedside for safety. Liver biopsy done without problem. Pt remains free from falls or injuries. Cont to monitor.  Problem: Adult Inpatient Plan of Care  Goal: Plan of Care Review  Outcome: Progressing  Flowsheets (Taken 11/01/2022 1602)  Progress: improving  Plan of Care Reviewed With: patient  Goal: Patient-Specific Goal (Individualized)  Outcome: Progressing  Flowsheets (Taken 11/01/2022 1602)  Patient/Family-Specific Goals (Include Timeframe): pt will remain free from falls or injuries this shift.  Individualized Care Needs: monitor VS and labs. wound care. total care/Q2turns. feeder. IV abx. IV bolus. aspiration/falls prec.  Anxieties, Fears or Concerns: none voiced  Goal: Absence of Hospital-Acquired Illness or Injury  Outcome: Progressing  Intervention: Identify and Manage Fall Risk  Recent Flowsheet Documentation  Taken 11/01/2022 0800 by Lonia Mad, RN  Safety Interventions:   environmental modification   fall reduction program maintained   infection management   lighting adjusted for tasks/safety   low bed   muscle strengthening facilitated   nonskid shoes/slippers when out of bed   bed alarm   safety attendant   sitter at bedside  Intervention: Prevent and Manage VTE (Venous Thromboembolism) Risk  Recent Flowsheet Documentation  Taken 11/01/2022 0800 by Lonia Mad, RN  Anti-Embolism Device Type: SCD, Knee  Anti-Embolism Intervention: Refused  Anti-Embolism Device Location: BLE  Intervention: Prevent Infection  Recent Flowsheet Documentation  Taken 11/01/2022 0800 by Lonia Mad, RN  Infection Prevention:   environmental surveillance performed   equipment surfaces disinfected   hand hygiene promoted   rest/sleep promoted   single patient room provided  Goal: Optimal Comfort and Wellbeing  Outcome: Progressing  Goal: Readiness for Transition of Care  Outcome: Progressing  Goal: Rounds/Family Conference  Outcome: Progressing     Problem: Infection  Goal: Absence of Infection Signs and Symptoms  Outcome: Progressing  Intervention: Prevent or Manage Infection  Recent Flowsheet Documentation  Taken 11/01/2022 0800 by Lonia Mad, RN  Infection Management: aseptic technique maintained     Problem: Self-Care Deficit  Goal: Improved Ability to Complete Activities of Daily Living  Outcome: Progressing     Problem: VTE (Venous Thromboembolism)  Goal: Tissue Perfusion  Outcome: Progressing  Intervention: Optimize Tissue Perfusion  Recent Flowsheet Documentation  Taken 11/01/2022 0800 by Lonia Mad, RN  Bleeding Precautions: blood pressure closely monitored  Anti-Embolism Device Type: SCD, Knee  Anti-Embolism Intervention: Refused  Anti-Embolism Device Location: BLE  Goal: Right Ventricular Function  Outcome: Progressing     Problem: Fall Injury Risk  Goal: Absence of Fall and Fall-Related Injury  Outcome: Progressing  Intervention: Promote Injury-Free Environment  Recent Flowsheet Documentation  Taken 11/01/2022 0800 by Lonia Mad, RN  Safety Interventions:   environmental modification   fall reduction program maintained   infection management   lighting adjusted for tasks/safety   low bed   muscle strengthening facilitated   nonskid shoes/slippers when out of bed   bed alarm   safety attendant   sitter at bedside     Problem: Wound  Goal: Optimal Coping  Outcome: Progressing  Goal: Optimal Functional Ability  Outcome: Progressing  Intervention: Optimize Functional Ability  Recent Flowsheet Documentation  Taken 11/01/2022 0800 by Lonia Mad, RN  Activity Management: bedrest  Goal: Absence of Infection Signs and Symptoms  Outcome: Progressing  Intervention: Prevent or Manage Infection  Recent Flowsheet Documentation  Taken 11/01/2022 0800 by Lonia Mad, RN  Infection Management: aseptic technique maintained  Goal: Improved Oral Intake  Outcome: Progressing  Goal: Optimal Pain  Control and Function  Outcome: Progressing  Goal: Skin Health and Integrity  Outcome: Progressing  Intervention: Optimize Skin Protection  Recent Flowsheet Documentation  Taken 11/01/2022 0800 by Lonia Mad, RN  Activity Management: bedrest  Head of Bed (HOB) Positioning:   HOB elevated   HOB at 30 degrees  Goal: Optimal Wound Healing  Outcome: Progressing

## 2022-11-01 NOTE — Unmapped (Signed)
Pt alert and oriented x4.  Received a call from telemetery for 5 beats of vtach and HR 150, MEDU resident notified and administered IV Mag x1. Other VSS on RA. Denies pain. Heparin gtt stopped at midnight. NPO at midnight for liver biopsy. Bladder scan performed -see F/S. PNA at bedside. All safety interventions maintained throughout the shift.      Problem: Adult Inpatient Plan of Care  Goal: Plan of Care Review  Outcome: Ongoing - Unchanged  Goal: Patient-Specific Goal (Individualized)  Outcome: Ongoing - Unchanged  Goal: Absence of Hospital-Acquired Illness or Injury  Outcome: Ongoing - Unchanged  Intervention: Identify and Manage Fall Risk  Recent Flowsheet Documentation  Taken 10/31/2022 2000 by Genevive Bi, RN  Safety Interventions:   fall reduction program maintained   family at bedside   lighting adjusted for tasks/safety   low bed   nonskid shoes/slippers when out of bed  Intervention: Prevent Infection  Recent Flowsheet Documentation  Taken 10/31/2022 2000 by Genevive Bi, RN  Infection Prevention:   environmental surveillance performed   equipment surfaces disinfected   hand hygiene promoted   rest/sleep promoted   personal protective equipment utilized  Goal: Optimal Comfort and Wellbeing  Outcome: Ongoing - Unchanged  Goal: Readiness for Transition of Care  Outcome: Ongoing - Unchanged  Goal: Rounds/Family Conference  Outcome: Ongoing - Unchanged

## 2022-11-01 NOTE — Unmapped (Signed)
CTU and Cysto appointment needed scheduled       Thanks

## 2022-11-01 NOTE — Unmapped (Signed)
UROLOGY FOLLOW UP REQUEST    Requesting physician/contact for questions: Mariea Stable, MD      Date(s) Needed: 3-4 weeks    Appt to be scheduled under: any provider in locals cy[facility]     Type of Appt: new patient gross hematuria workup    Need to contact patient: Yes please    Special Requests/Instructions: [facility] cysto and CTU    Orders placed for Special Requests: Yes

## 2022-11-02 LAB — VANCOMYCIN, RANDOM: VANCOMYCIN RANDOM: 17.5 ug/mL

## 2022-11-02 LAB — COMPREHENSIVE METABOLIC PANEL
ALBUMIN: 2.3 g/dL — ABNORMAL LOW (ref 3.4–5.0)
ALKALINE PHOSPHATASE: 582 U/L — ABNORMAL HIGH (ref 46–116)
ALT (SGPT): 206 U/L — ABNORMAL HIGH (ref 10–49)
ANION GAP: 11 mmol/L (ref 5–14)
AST (SGOT): 388 U/L — ABNORMAL HIGH (ref <=34)
BILIRUBIN TOTAL: 5.9 mg/dL — ABNORMAL HIGH (ref 0.3–1.2)
BLOOD UREA NITROGEN: 36 mg/dL — ABNORMAL HIGH (ref 9–23)
BUN / CREAT RATIO: 17
CALCIUM: 8.2 mg/dL — ABNORMAL LOW (ref 8.7–10.4)
CHLORIDE: 110 mmol/L — ABNORMAL HIGH (ref 98–107)
CO2: 16 mmol/L — ABNORMAL LOW (ref 20.0–31.0)
CREATININE: 2.12 mg/dL — ABNORMAL HIGH
EGFR CKD-EPI (2021) MALE: 32 mL/min/{1.73_m2} — ABNORMAL LOW (ref >=60)
GLUCOSE RANDOM: 106 mg/dL (ref 70–179)
POTASSIUM: 4.8 mmol/L (ref 3.4–4.8)
PROTEIN TOTAL: 5.5 g/dL — ABNORMAL LOW (ref 5.7–8.2)
SODIUM: 137 mmol/L (ref 135–145)

## 2022-11-02 LAB — CBC W/ AUTO DIFF
BASOPHILS ABSOLUTE COUNT: 0.1 10*9/L (ref 0.0–0.1)
BASOPHILS RELATIVE PERCENT: 1 %
EOSINOPHILS ABSOLUTE COUNT: 0.1 10*9/L (ref 0.0–0.5)
EOSINOPHILS RELATIVE PERCENT: 1.1 %
HEMATOCRIT: 43.3 % (ref 39.0–48.0)
HEMOGLOBIN: 14.3 g/dL (ref 12.9–16.5)
LYMPHOCYTES ABSOLUTE COUNT: 0.7 10*9/L — ABNORMAL LOW (ref 1.1–3.6)
LYMPHOCYTES RELATIVE PERCENT: 10.8 %
MEAN CORPUSCULAR HEMOGLOBIN CONC: 33.1 g/dL (ref 32.0–36.0)
MEAN CORPUSCULAR HEMOGLOBIN: 27.5 pg (ref 25.9–32.4)
MEAN CORPUSCULAR VOLUME: 82.9 fL (ref 77.6–95.7)
MEAN PLATELET VOLUME: 10.3 fL (ref 6.8–10.7)
MONOCYTES ABSOLUTE COUNT: 0.6 10*9/L (ref 0.3–0.8)
MONOCYTES RELATIVE PERCENT: 9.7 %
NEUTROPHILS ABSOLUTE COUNT: 5 10*9/L (ref 1.8–7.8)
NEUTROPHILS RELATIVE PERCENT: 77.4 %
PLATELET COUNT: 76 10*9/L — ABNORMAL LOW (ref 150–450)
RED BLOOD CELL COUNT: 5.22 10*12/L (ref 4.26–5.60)
RED CELL DISTRIBUTION WIDTH: 22.4 % — ABNORMAL HIGH (ref 12.2–15.2)
WBC ADJUSTED: 6.5 10*9/L (ref 3.6–11.2)

## 2022-11-02 LAB — MAGNESIUM: MAGNESIUM: 2.5 mg/dL (ref 1.6–2.6)

## 2022-11-02 LAB — SLIDE REVIEW

## 2022-11-02 LAB — APTT
APTT: 44 s — ABNORMAL HIGH (ref 24.8–38.4)
HEPARIN CORRELATION: 0.3

## 2022-11-02 MED ADMIN — lidocaine 4 % patch 1 patch: 1 | TRANSDERMAL | @ 13:00:00

## 2022-11-02 MED ADMIN — clopidogrel (PLAVIX) tablet 75 mg: 75 mg | ORAL | @ 13:00:00

## 2022-11-02 MED ADMIN — tamsulosin (FLOMAX) 24 hr capsule 0.4 mg: .4 mg | ORAL | @ 13:00:00

## 2022-11-02 MED ADMIN — mirtazapine (REMERON) tablet 22.5 mg: 22.5 mg | ORAL

## 2022-11-02 MED ADMIN — acetaminophen (TYLENOL) tablet 650 mg: 650 mg | ORAL | @ 10:00:00

## 2022-11-02 MED ADMIN — potassium & sodium phosphates 250mg (PHOS-NAK/NEUTRA PHOS) packet 2 packet: 2 | ORAL | @ 09:00:00 | Stop: 2022-11-02

## 2022-11-02 MED ADMIN — vancomycin (VANCOCIN) 1250 mg in sodium chloride (NS) 0.9% 250 mL IVPB: 1250 mg | INTRAVENOUS | @ 03:00:00 | Stop: 2022-11-06

## 2022-11-02 MED ADMIN — amlodipine (NORVASC) tablet 5 mg: 5 mg | ORAL | @ 13:00:00 | Stop: 2022-11-02

## 2022-11-02 MED ADMIN — escitalopram oxalate (LEXAPRO) tablet 20 mg: 20 mg | ORAL | @ 13:00:00

## 2022-11-02 MED ADMIN — acetaminophen (TYLENOL) tablet 650 mg: 650 mg | ORAL

## 2022-11-02 MED ADMIN — pantoprazole (Protonix) EC tablet 40 mg: 40 mg | ORAL | @ 13:00:00

## 2022-11-02 MED ADMIN — finasteride (PROSCAR) tablet 5 mg: 5 mg | ORAL | @ 13:00:00

## 2022-11-02 MED ADMIN — levothyroxine (SYNTHROID) tablet 100 mcg: 100 ug | ORAL | @ 09:00:00

## 2022-11-02 MED ADMIN — ezetimibe (ZETIA) tablet 20 mg: 20 mg | ORAL | @ 13:00:00

## 2022-11-02 MED ADMIN — pregabalin (LYRICA) capsule 75 mg: 75 mg | ORAL | @ 13:00:00 | Stop: 2022-11-02

## 2022-11-02 MED ADMIN — loperamide (IMODIUM) capsule 2 mg: 2 mg | ORAL | @ 05:00:00 | Stop: 2022-11-02

## 2022-11-02 MED ADMIN — carvedilol (COREG) tablet 12.5 mg: 12.5 mg | ORAL | @ 13:00:00

## 2022-11-02 MED ADMIN — aspirin chewable tablet 81 mg: 81 mg | ORAL | @ 13:00:00 | Stop: 2022-11-02

## 2022-11-02 MED ADMIN — nicotine (NICODERM CQ) 21 mg/24 hr patch 1 patch: 1 | TRANSDERMAL | @ 13:00:00

## 2022-11-02 MED ADMIN — loperamide (IMODIUM) capsule 2 mg: 2 mg | ORAL | @ 21:00:00 | Stop: 2022-11-02

## 2022-11-02 MED ADMIN — predniSONE (DELTASONE) tablet 10 mg: 10 mg | ORAL | @ 13:00:00

## 2022-11-02 MED ADMIN — LORazepam (ATIVAN) tablet 0.5 mg: .5 mg | ORAL | @ 13:00:00

## 2022-11-02 MED ADMIN — potassium & sodium phosphates 250mg (PHOS-NAK/NEUTRA PHOS) packet 2 packet: 2 | ORAL | Stop: 2022-11-02

## 2022-11-02 MED ADMIN — carvedilol (COREG) tablet 12.5 mg: 12.5 mg | ORAL

## 2022-11-02 NOTE — Unmapped (Signed)
[facility] - Operative Note     VIR Post-Procedure Note    Procedure Name: Targeted liver biopsy    Pre-Op Diagnosis: Multiple liver masses    Post-Op Diagnosis: Same as pre-operative diagnosis    VIR Providers    Operator: Jake Bathe, MD    Time out: Prior to the procedure, a time out was performed with all team members present. During the time out, the patient, procedure and procedure site when applicable were verbally verified by the team members and Jake Bathe, MD.    Description of procedure: succcessful targeted liver biopsy. 3 passes 3 cores.     Plan: Restart anticoagulation tomorrow    Sedation:Moderate sedation    Estimated Blood Loss: approximately 5 mL  Complications: None    See detailed procedure note with images in PACS ([facility]).    The patient tolerated the procedure well without incident or complication and left the room in stable condition.    Jake Bathe, MD  11/02/2022 7:17 AM

## 2022-11-02 NOTE — Unmapped (Signed)
Patient drowsy oriented x4. VSS on room air. 1:1 sitter discontinued. Rounded on patient, call light and belongings within reach. Fall precautions in place, bed alarm on.     Problem: Adult Inpatient Plan of Care  Goal: Plan of Care Review  Outcome: Progressing  Goal: Patient-Specific Goal (Individualized)  Outcome: Progressing  Goal: Absence of Hospital-Acquired Illness or Injury  Outcome: Progressing  Goal: Optimal Comfort and Wellbeing  Outcome: Progressing  Goal: Readiness for Transition of Care  Outcome: Progressing  Goal: Rounds/Family Conference  Outcome: Progressing     Problem: Infection  Goal: Absence of Infection Signs and Symptoms  Outcome: Progressing     Problem: Self-Care Deficit  Goal: Improved Ability to Complete Activities of Daily Living  Outcome: Progressing     Problem: VTE (Venous Thromboembolism)  Goal: Tissue Perfusion  Outcome: Progressing  Goal: Right Ventricular Function  Outcome: Progressing     Problem: Fall Injury Risk  Goal: Absence of Fall and Fall-Related Injury  Outcome: Progressing     Problem: Wound  Goal: Optimal Coping  Outcome: Progressing  Goal: Optimal Functional Ability  Outcome: Progressing  Goal: Absence of Infection Signs and Symptoms  Outcome: Progressing  Goal: Improved Oral Intake  Outcome: Progressing  Goal: Optimal Pain Control and Function  Outcome: Progressing  Goal: Skin Health and Integrity  Outcome: Progressing  Goal: Optimal Wound Healing  Outcome: Progressing

## 2022-11-02 NOTE — Unmapped (Signed)
Internal Medicine (MEDU) Progress Note    Assessment & Plan:   MERRITT ACHEE is a 77 y.o. 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 *        Active Problems    Liver Masses c/f metastatic disease  Patient with transaminitis with CT findings concerning for metastatic disease involving numerous liver masses; spleen and lung involvement with enlarged periportal lymph nodes. Gallbladder normal and no ductal dilation on initial imaging although LFTs/Bili continues to uptrend this hospital stay raising concerns for biliary obstruction likely related to mass effect.   - s/p liver biopsy, follow up pathologies  - Plavix restarted     Transaminitis/Hyperbilirubinemia  As above. Biliary GI consulted and MRCP performed on 3/12 showing no evidence of biliary duct dilation, stones or biliary obstruction. Gallbladder wall edema and periportal edema likely secondary to underlying liver dx. Bilirubin increasing, TB 6.5, DB 5.2 despite normal MRCP findings. C/f worsening liver dysfunction.   -Continue to trend LFTs, PT-INR  - Med/Onc consulted, appreciate recs regarding treatment pending biopsy results.    Diarrhea  Presenting complaint complicated by hypovolemia and dehydration. Patient continues with diarrhea although improving some today. Present for at least 3 weeks now. GI pathogen panel and C. difficile negative. CT AP without obvious inflammatory changes although noted fatty infiltration of the ascending and transverse colonic wall, most likely related to sequelae of chronic inflammation.  If no resolution will need a broader work up for chronic diarrhea including C-scope. No colonoscopy records in chart. T4/TSH normal. Consider neuroendocrine tumor vs other infectious etiologies. Continuing to have diarrhea despite imodium use.   - Continue mIVF  - Scheduled imodium TID  - Further workup if no resolution     RUQ Pain  Ongoing RUQ, acutely worse night of 3/12 with radiation to chest. Evaluated for ACS given cardiac history and improvement with SLN although EKG unchanged from prior and troponin x 2 negative. Ultimately concern remains for pain associated with his liver pathology as above.   -see above for work up.    AKI on CKD 3b (GFR 30-45)  Metabolic Acidosis without AG  BL 1.5-1.7; presenting with AKI in setting of diarrhea and hypovolemia. Improved with fluids although most recently again uptreding after fluid discontinuation  - Daily BMP, add on VBG to morning labs given acidosis  - resume mIVF 171ml/hr given uptrending Cr.  - Renally dose meds  - Avoid nephrotoxins (including NSAIDs, contrasted studies, fleets enemas)    Ventricular Tachycardia   Episode of 5 beats of VT noted on telemetry ON on 3/13. Patient asymptomatic and HDS. No  further episodes. SVT x 8 seconds overnight.   - Continue telemetry  - Replete electrolytes as needed    Thrombocytopenia  Presented with mild decrease in platelets to 114 on admission. Currently stable in the 90s. Lower concern for HIT as he was already low on presentation. No evidence of hemolysis given normal haptoglobin (LDH significantly elevated although suspect this may be due to malignancy). B12/Folate normal.  -monitor for now; further work up if ongoing downtrend    Elevated Lactate (resolved) - Hypovolemia  Elevated lactate resolved after IV fluids. Elevated lactate most likely in the setting of hypovolemia secondary to diarrhea.  Less suspicion for urosepsis given patient is asymptomatic and urine culture results are not significant.  Will proceed with the following:  - IV fluids  - Follow Bcx - NGTD    Hypophosphatemia  Monitoring and repleating.    Hypertension  At home on Coreg 12.5BID and Amlodipine 5QD. Initially held in setting on hypotension but later hypertensive. BP now improving with resumption of home meds.   -Coreg increased to 12.5mg  daily  -Home Amlodipine added 3/13    Urine Culture w/ E. faecalis  Urine culture with 10-50k colonies of Enterococcus faecalis; given immunosuppressed state (on Prednisone and Cellcept), bladder outlet obstruction with intermittent catheterization, and recent hematuria he will complete 7 days of treatment (s/p 3 days of Zosyn now transitioned to Vancomycin).  -Continue Vancomycin until 3/14; renally dosed q24h     Upper Lobe Mass R Lung  Patient with known tobacco use history. 1ppd since teenager. CT scan showing 4.3 cm posterior RUL mass with multiple nodules. Unclear if this could be primary site.  -- Follow up liver biopsy      Irregular Thickening of Bladder Wall   Concerning for another possible source of malignancy, as bladder cancer can spread to liver and lung. Of note, patient had various episodes of gross hematuria, although U/A showed a decrease in RBCs from prior sample at admission. Urology consulted, appreciate evaluation and recommendations. Stated that they would proceed with a bladder scan to make sure he is not retaining and urine studies first as the rest of the workup can be done in the outpatient setting, including cystoscopy. They would recommend proceeding first with the liver biopsy as already planned.  --Urology consulted, will follow up outpatient.  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 Currently on thyroid and adrenal replacement.   -continue Prednisone 10 mg daily - (patient will need stress dose steroids if acute decompensation/sepsis/surgery)  -continue home Levothyroxine 100 mcg daily     Aortic Aneurysm 4.6 cm - Dissection Common Illiac Artery  Was seen in cardiology clinic 2/8 and had CTA done showing increased abdominal aortic aneurysm up to 4.6 cm and new short segment dissection flap in R common iliac artery, with unchanged dissection in L common iliac artery.   -- Vascular surgery referral for the outpatient setting    LE swelling, bilateral   Patient with bilateral LE swelling on 3/12, in the setting of fluid resuscitation for AKI. Due to extensive CAD hx, c/f DVT.   - LE PVL  - Restart home Plavix and Eliquis  - mIVF to 75 ml/hr  CAD s/p PCI to distal LAD and OM1 in 05/2022  Severe CAD with recent intervention as above. On Plavix alongside eliquis as below. on zetia, evolocumab. Restart plavix 3/13.    DVT hx: Restart home Eliquis 3/14    CIDP: Holding Cellcept, also treated with IVIG in the outpatient setting. Followed by neurology.  -Cellcept on hold here while treated for infection    Restless leg: Lyrica 75 mg nightly; held 3/14 given uptrending Cr.     IgG-lambda monoclonal gammopathy/MGUS    - Follows with Dr. Barbette Merino. Under evaluation for possible POEMS given associated polyneuropathy although this has been felt to be less likely.    HLD  Resume Zetia; evolocumab not on formulary.    Issues Impacting Complexity of Management:  The patient's presentation is complicated by the following clinically significant conditions requiring additional evaluation and treatment: - Hypercoagulable state requiring additional attention to DVT prophylaxis and treatment  - Chronic kidney disease POA requiring further investigation, treatment, or monitoring   - Volume depletion POA requiring further investigation, treatment, or monitoring  - Metastatic cancer POA requiring further investigation, treatment, or monitoring         Issues Impacting Complexity of Management:  -Discussion of risks/benefits of biopsy of lung or liver  with the patient including risk of risk factor: hemorrhage, infection, and medical complications.        Medical Decision Making: Reviewed records from the following unique sources  outpatient heme/onc, neurology, cardiology visit.      Daily Checklist:  Diet: Regular Diet  DVT PPx:  Heparin gtt while admitted  Code Status: Full Code  Dispo:  Continue current level of care    Team Contact Information:   Primary Team: Internal Medicine (MEDU)  Primary Resident: Sherrie George, MD  Resident's Pager: [phone] (Gen MedU Intern - Tower)      Interval History:   Overnight patient with SVT and HR to 160s that self-resolved. Patient asymptomatic with no pain. Continues to have diarrhea. Tolerated liver biopsy yesterday.     Discussion with patient's children today about worsening liver function and c/f poor prognosis. Discussing code status.     ROS: Denies headache, chest pain, shortness of breath, abdominal pain, nausea, vomiting.    Objective:   Temp:  [36.6 ??C (97.9 ??F)-36.8 ??C (98.2 ??F)] 36.7 ??C (98.1 ??F)  Heart Rate:  [61-167] 62  Resp:  [11-20] 20  BP: (118-145)/(63-80) 129/63  SpO2:  [93 %-97 %] 96 %    Gen: NAD, converses   HENT: atraumatic, normocephalic  Heart: RRR  Lungs: CTAB, no crackles or wheezes  Abdomen: soft, non-distended. RUQ tenderness to palpation.   Skin:  scattered crustly skin lesions, known skin cancer per patient   Extremities: 1+ Bilateral LE edema   GU: Urethral meatus without blood or discharge    Gershon Cull, DO  PGY-1 Anesthesiology

## 2022-11-02 NOTE — Unmapped (Incomplete)
Apixaban Therapeutic Monitoring Pharmacy Note    Christopher Brennan is a 77 y.o. male continuing apixaban.     Indication: {Multiple Anticoag Indications:59059}    Prior Dosing Information:  Home      Goals:  Therapeutic Drug Levels  {Blank single:19197::Not applicable,***}    Additional Clinical Monitoring/Outcomes  Monitor hemoglobin and platelets  Monitor for signs/symptoms of bleeding  Monitor renal function (SCr and urine output)    Results:  HGB   Date Value Ref Range Status   11/02/2022 14.3 12.9 - 16.5 g/dL Final   16/05/9603 54.0 12.9 - 16.5 g/dL Final   98/06/9146 82.9 12.9 - 16.5 g/dL Final     Platelet   Date Value Ref Range Status   11/02/2022 76 (L) 150 - 450 10*9/L Final   11/01/2022 79 (L) 150 - 450 10*9/L Final   10/31/2022 94 (L) 150 - 450 10*9/L Final     Creatinine   Date Value Ref Range Status   11/01/2022 1.73 (H) 0.73 - 1.18 mg/dL Final   56/21/3086 5.78 (H) 0.73 - 1.18 mg/dL Final   46/96/2952 8.41 (H) 0.73 - 1.18 mg/dL Final     Wt Readings from Last 3 Encounters:   10/28/22 (!) 101.5 kg (223 lb 12.8 oz)   09/28/22 (!) 105.7 kg (233 lb)   09/20/22 (!) 107.6 kg (237 lb 3.4 oz)       Pharmacokinetic Considerations and Significant Drug Interactions:   Plavix (antiplatelet)-homemed  Aspirin 81 (antiplatelet) - Heparin drip --- these are to be discontinued after procedures  (resume home therapy)    Assessment/Plan:  Recommendation(s)   Home regimen:    Apixiban 2.5mg  BID (restarted 3/14)  Plavix 75mg  Daily       Follow-up  Continue to monitor hemoglobin, platelets, signs/symptoms of bleeding, and renal function.  A pharmacist will continue to monitor as appropriate    Please page service pharmacist with questions/clarifications.    Drue Flirt, [facility]

## 2022-11-02 NOTE — Unmapped (Addendum)
Pt drowsy and oriented x3-4. VSS on RA. Administered PRN Tylenol for pain with moderate relief. Bladder scan performed -see F/S. PNA at bedside. No acute events overnight. All safety interventions maintained throughout the shift.      Received a call from telemetry. Pt had 8 seconds of PSVT with a HR of 167. HR now is low 60s with PVCs, MEDU resident notified.     Problem: Adult Inpatient Plan of Care  Goal: Plan of Care Review  Outcome: Progressing  Goal: Patient-Specific Goal (Individualized)  Outcome: Progressing  Goal: Absence of Hospital-Acquired Illness or Injury  Outcome: Progressing  Intervention: Identify and Manage Fall Risk  Recent Flowsheet Documentation  Taken 11/01/2022 2000 by Genevive Bi, RN  Safety Interventions:   bed alarm   environmental modification   fall reduction program maintained   lighting adjusted for tasks/safety   low bed   nonskid shoes/slippers when out of bed   elopement precautions  Intervention: Prevent and Manage VTE (Venous Thromboembolism) Risk  Recent Flowsheet Documentation  Taken 11/01/2022 2000 by Genevive Bi, RN  Anti-Embolism Device Type: SCD, Knee  Anti-Embolism Intervention: Refused  Anti-Embolism Device Location: BLE  Intervention: Prevent Infection  Recent Flowsheet Documentation  Taken 11/01/2022 2000 by Genevive Bi, RN  Infection Prevention:   environmental surveillance performed   equipment surfaces disinfected   hand hygiene promoted   rest/sleep promoted   personal protective equipment utilized  Goal: Optimal Comfort and Wellbeing  Outcome: Progressing  Goal: Readiness for Transition of Care  Outcome: Progressing  Goal: Rounds/Family Conference  Outcome: Progressing

## 2022-11-03 LAB — BLOOD GAS, VENOUS
BASE EXCESS VENOUS: -3.3 — ABNORMAL LOW (ref -2.0–2.0)
HCO3 VENOUS: 21 mmol/L — ABNORMAL LOW (ref 22–27)
O2 SATURATION VENOUS: 91.6 % — ABNORMAL HIGH (ref 40.0–85.0)
PCO2 VENOUS: 34 mmHg — ABNORMAL LOW (ref 40–60)
PH VENOUS: 7.39 (ref 7.32–7.43)
PO2 VENOUS: 60 mmHg — ABNORMAL HIGH (ref 30–55)

## 2022-11-03 LAB — CBC W/ AUTO DIFF
BASOPHILS ABSOLUTE COUNT: 0.1 10*9/L (ref 0.0–0.1)
BASOPHILS RELATIVE PERCENT: 0.8 %
EOSINOPHILS ABSOLUTE COUNT: 0.1 10*9/L (ref 0.0–0.5)
EOSINOPHILS RELATIVE PERCENT: 1.7 %
HEMATOCRIT: 45.3 % (ref 39.0–48.0)
HEMOGLOBIN: 14.9 g/dL (ref 12.9–16.5)
LYMPHOCYTES ABSOLUTE COUNT: 1.1 10*9/L (ref 1.1–3.6)
LYMPHOCYTES RELATIVE PERCENT: 15.7 %
MEAN CORPUSCULAR HEMOGLOBIN CONC: 33 g/dL (ref 32.0–36.0)
MEAN CORPUSCULAR HEMOGLOBIN: 27.5 pg (ref 25.9–32.4)
MEAN CORPUSCULAR VOLUME: 83.3 fL (ref 77.6–95.7)
MEAN PLATELET VOLUME: 9.2 fL (ref 6.8–10.7)
MONOCYTES ABSOLUTE COUNT: 0.6 10*9/L (ref 0.3–0.8)
MONOCYTES RELATIVE PERCENT: 9 %
NEUTROPHILS ABSOLUTE COUNT: 4.9 10*9/L (ref 1.8–7.8)
NEUTROPHILS RELATIVE PERCENT: 72.8 %
PLATELET COUNT: 67 10*9/L — ABNORMAL LOW (ref 150–450)
RED BLOOD CELL COUNT: 5.44 10*12/L (ref 4.26–5.60)
RED CELL DISTRIBUTION WIDTH: 22.7 % — ABNORMAL HIGH (ref 12.2–15.2)
WBC ADJUSTED: 6.7 10*9/L (ref 3.6–11.2)

## 2022-11-03 LAB — SODIUM, STOOL: SODIUM STOOL: 38 mmol/L

## 2022-11-03 LAB — URINALYSIS WITH MICROSCOPY
GLUCOSE UA: NEGATIVE
GRANULAR CASTS: 7 /LPF — ABNORMAL HIGH (ref <=0)
HYALINE CASTS: 12 /LPF — ABNORMAL HIGH (ref 0–1)
KETONES UA: NEGATIVE
LEUKOCYTE ESTERASE UA: NEGATIVE
NITRITE UA: NEGATIVE
PH UA: 5.5 (ref 5.0–9.0)
PROTEIN UA: 50 — AB
RBC UA: 28 /HPF — ABNORMAL HIGH (ref <=3)
SPECIFIC GRAVITY UA: 1.026 (ref 1.003–1.030)
SQUAMOUS EPITHELIAL: 1 /HPF (ref 0–5)
UROBILINOGEN UA: <2
WBC UA: 4 /HPF — ABNORMAL HIGH (ref <=2)

## 2022-11-03 LAB — MAGNESIUM: MAGNESIUM: 2.5 mg/dL (ref 1.6–2.6)

## 2022-11-03 LAB — COMPREHENSIVE METABOLIC PANEL
ALBUMIN: 2.4 g/dL — ABNORMAL LOW (ref 3.4–5.0)
ALKALINE PHOSPHATASE: 686 U/L — ABNORMAL HIGH (ref 46–116)
ALT (SGPT): 201 U/L — ABNORMAL HIGH (ref 10–49)
ANION GAP: 7 mmol/L (ref 5–14)
AST (SGOT): 409 U/L — ABNORMAL HIGH (ref <=34)
BILIRUBIN TOTAL: 6.6 mg/dL — ABNORMAL HIGH (ref 0.3–1.2)
BLOOD UREA NITROGEN: 42 mg/dL — ABNORMAL HIGH (ref 9–23)
BUN / CREAT RATIO: 20
CALCIUM: 8.3 mg/dL — ABNORMAL LOW (ref 8.7–10.4)
CHLORIDE: 113 mmol/L — ABNORMAL HIGH (ref 98–107)
CO2: 18 mmol/L — ABNORMAL LOW (ref 20.0–31.0)
CREATININE: 2.15 mg/dL — ABNORMAL HIGH
EGFR CKD-EPI (2021) MALE: 31 mL/min/{1.73_m2} — ABNORMAL LOW (ref >=60)
GLUCOSE RANDOM: 78 mg/dL (ref 70–179)
POTASSIUM: 4.8 mmol/L (ref 3.4–4.8)
PROTEIN TOTAL: 5.7 g/dL (ref 5.7–8.2)
SODIUM: 138 mmol/L (ref 135–145)

## 2022-11-03 LAB — POTASSIUM, STOOL: POTASSIUM STOOL: 37.7 mmol/L

## 2022-11-03 LAB — PHOSPHORUS: PHOSPHORUS: 1.7 mg/dL — ABNORMAL LOW (ref 2.4–5.1)

## 2022-11-03 MED ADMIN — LORazepam (ATIVAN) tablet 0.5 mg: .5 mg | ORAL | @ 21:00:00

## 2022-11-03 MED ADMIN — tamsulosin (FLOMAX) 24 hr capsule 0.4 mg: .4 mg | ORAL | @ 13:00:00

## 2022-11-03 MED ADMIN — lactated Ringers infusion: 100 mL/h | INTRAVENOUS | @ 05:00:00 | Stop: 2022-11-03

## 2022-11-03 MED ADMIN — loperamide (IMODIUM) capsule 2 mg: 2 mg | ORAL | @ 09:00:00

## 2022-11-03 MED ADMIN — apixaban (ELIQUIS) tablet 2.5 mg: 2.5 mg | ORAL | @ 02:00:00

## 2022-11-03 MED ADMIN — lactated Ringers infusion: 100 mL/h | INTRAVENOUS | @ 16:00:00 | Stop: 2022-11-03

## 2022-11-03 MED ADMIN — pantoprazole (Protonix) EC tablet 40 mg: 40 mg | ORAL | @ 13:00:00

## 2022-11-03 MED ADMIN — vancomycin (VANCOCIN) 1250 mg in sodium chloride (NS) 0.9% 250 mL IVPB: 1250 mg | INTRAVENOUS | @ 02:00:00 | Stop: 2022-11-06

## 2022-11-03 MED ADMIN — lactated Ringers infusion: 100 mL/h | INTRAVENOUS | @ 14:00:00 | Stop: 2022-11-03

## 2022-11-03 MED ADMIN — amlodipine (NORVASC) tablet 5 mg: 5 mg | ORAL | @ 13:00:00

## 2022-11-03 MED ADMIN — lidocaine 4 % patch 1 patch: 1 | TRANSDERMAL | @ 13:00:00

## 2022-11-03 MED ADMIN — loperamide (IMODIUM) capsule 2 mg: 2 mg | ORAL | @ 21:00:00

## 2022-11-03 MED ADMIN — finasteride (PROSCAR) tablet 5 mg: 5 mg | ORAL | @ 13:00:00

## 2022-11-03 MED ADMIN — sodium phosphate 30 mmol in dextrose 5 % 250 mL IVPB: 30 mmol | INTRAVENOUS | @ 17:00:00 | Stop: 2022-11-03

## 2022-11-03 MED ADMIN — ezetimibe (ZETIA) tablet 20 mg: 20 mg | ORAL | @ 13:00:00

## 2022-11-03 MED ADMIN — LORazepam (ATIVAN) tablet 0.5 mg: .5 mg | ORAL | @ 13:00:00

## 2022-11-03 MED ADMIN — lactated Ringers infusion: 100 mL/h | INTRAVENOUS | @ 23:00:00 | Stop: 2022-11-03

## 2022-11-03 MED ADMIN — predniSONE (DELTASONE) tablet 10 mg: 10 mg | ORAL | @ 13:00:00

## 2022-11-03 MED ADMIN — levothyroxine (SYNTHROID) tablet 100 mcg: 100 ug | ORAL | @ 09:00:00

## 2022-11-03 MED ADMIN — carvedilol (COREG) tablet 12.5 mg: 12.5 mg | ORAL | @ 02:00:00

## 2022-11-03 MED ADMIN — loperamide (IMODIUM) capsule 2 mg: 2 mg | ORAL | @ 16:00:00

## 2022-11-03 MED ADMIN — calcium carbonate (TUMS) chewable tablet 200 mg of elem calcium: 200 mg | ORAL | @ 14:00:00

## 2022-11-03 MED ADMIN — mirtazapine (REMERON) tablet 22.5 mg: 22.5 mg | ORAL | @ 02:00:00

## 2022-11-03 MED ADMIN — clopidogrel (PLAVIX) tablet 75 mg: 75 mg | ORAL | @ 13:00:00

## 2022-11-03 MED ADMIN — nicotine (NICODERM CQ) 21 mg/24 hr patch 1 patch: 1 | TRANSDERMAL | @ 13:00:00

## 2022-11-03 MED ADMIN — acetaminophen (TYLENOL) tablet 650 mg: 650 mg | ORAL | @ 10:00:00

## 2022-11-03 MED ADMIN — apixaban (ELIQUIS) tablet 2.5 mg: 2.5 mg | ORAL | @ 13:00:00

## 2022-11-03 MED ADMIN — escitalopram oxalate (LEXAPRO) tablet 20 mg: 20 mg | ORAL | @ 13:00:00

## 2022-11-03 NOTE — Unmapped (Signed)
Patient alert and oriented x4. VSS on room air. Patient ambulated to bathroom with wheeled walker. 72 hour stool collection continued, multiple bowel movements throughout the day. Rounded on patient, call light and belongings within reach. Fall precautions in place, bed alarm on.     Problem: Adult Inpatient Plan of Care  Goal: Plan of Care Review  Outcome: Progressing  Goal: Patient-Specific Goal (Individualized)  Outcome: Progressing  Goal: Absence of Hospital-Acquired Illness or Injury  Outcome: Progressing  Intervention: Identify and Manage Fall Risk  Recent Flowsheet Documentation  Taken 11/03/2022 1000 by Angelina Sheriff, RN  Safety Interventions: bed alarm  Taken 11/03/2022 0800 by Angelina Sheriff, RN  Safety Interventions: bed alarm  Intervention: Prevent Skin Injury  Recent Flowsheet Documentation  Taken 11/03/2022 1000 by Angelina Sheriff, RN  Positioning for Skin: Sitting in Chair  Taken 11/03/2022 0800 by Angelina Sheriff, RN  Positioning for Skin: Supine/Back  Intervention: Prevent and Manage VTE (Venous Thromboembolism) Risk  Recent Flowsheet Documentation  Taken 11/03/2022 1000 by Angelina Sheriff, RN  Anti-Embolism Intervention: (Eliquis) Other (Comment)  Taken 11/03/2022 0800 by Angelina Sheriff, RN  Anti-Embolism Intervention: (Eliquis) Other (Comment)  Goal: Optimal Comfort and Wellbeing  Outcome: Progressing  Goal: Readiness for Transition of Care  Outcome: Progressing  Goal: Rounds/Family Conference  Outcome: Progressing     Problem: Infection  Goal: Absence of Infection Signs and Symptoms  Outcome: Progressing     Problem: Self-Care Deficit  Goal: Improved Ability to Complete Activities of Daily Living  Outcome: Progressing     Problem: VTE (Venous Thromboembolism)  Goal: Tissue Perfusion  Outcome: Progressing  Intervention: Optimize Tissue Perfusion  Recent Flowsheet Documentation  Taken 11/03/2022 1000 by Angelina Sheriff, RN  Anti-Embolism Intervention: (Eliquis) Other (Comment)  Taken 11/03/2022 0800 by Angelina Sheriff, RN  Anti-Embolism Intervention: (Eliquis) Other (Comment)  Goal: Right Ventricular Function  Outcome: Progressing     Problem: Fall Injury Risk  Goal: Absence of Fall and Fall-Related Injury  Outcome: Progressing  Intervention: Promote Injury-Free Environment  Recent Flowsheet Documentation  Taken 11/03/2022 1000 by Angelina Sheriff, RN  Safety Interventions: bed alarm  Taken 11/03/2022 0800 by Angelina Sheriff, RN  Safety Interventions: bed alarm     Problem: Wound  Goal: Optimal Coping  Outcome: Progressing  Goal: Optimal Functional Ability  Outcome: Progressing  Goal: Absence of Infection Signs and Symptoms  Outcome: Progressing  Goal: Improved Oral Intake  Outcome: Progressing  Goal: Optimal Pain Control and Function  Outcome: Progressing  Goal: Skin Health and Integrity  Outcome: Progressing  Goal: Optimal Wound Healing  Outcome: Progressing

## 2022-11-03 NOTE — Unmapped (Signed)
VENOUS ACCESS TEAM PROCEDURE    Nurse request was placed for a PIV by Venous Access Team (VAT).  Patient was assessed at bedside for placement of a PIV. PPE were donned per protocol.  Access was obtained. Blood return noted.  Dressing intact and device well secured.  Flushed with normal saline.  See LDA for details.  Pt advised to inform RN of any s/s of discomfort at the PIV site.    Pt has extremely fragile skin with multiple skin tears.    Workup / Procedure Time:  15 minutes       Hope RN was notified.       Thank you,     Jacqulyn Liner, RN Venous Access Team

## 2022-11-03 NOTE — Unmapped (Signed)
Patient A&Ox4. VSS on room air. No acute events overnight. Patient started on 72 hour stool collect @ 0030. LR running @ 129ml/hr. Collection jugs on ice. Patient had multiple bowel movements overnight. Patient standby assist to bedside commode. Call bell and tray table within reach. Bed locked and in lowest position. Will continue to monitor.      Problem: Adult Inpatient Plan of Care  Goal: Plan of Care Review  Outcome: Ongoing - Unchanged  Flowsheets (Taken 11/03/2022 0455)  Progress: no change  Plan of Care Reviewed With: patient  Goal: Patient-Specific Goal (Individualized)  Outcome: Ongoing - Unchanged  Goal: Absence of Hospital-Acquired Illness or Injury  Outcome: Ongoing - Unchanged  Intervention: Identify and Manage Fall Risk  Recent Flowsheet Documentation  Taken 11/02/2022 2000 by Jodi Mourning, RN  Safety Interventions:   fall reduction program maintained   environmental modification   lighting adjusted for tasks/safety   low bed   nonskid shoes/slippers when out of bed   bed alarm   aspiration precautions  Intervention: Prevent and Manage VTE (Venous Thromboembolism) Risk  Recent Flowsheet Documentation  Taken 11/03/2022 0420 by Jodi Mourning, RN  Anti-Embolism Intervention: (eliquis) Other (Comment)  Taken 11/03/2022 0400 by Jodi Mourning, RN  Anti-Embolism Intervention: (eliquis) Other (Comment)  Taken 11/03/2022 0200 by Jodi Mourning, RN  Anti-Embolism Intervention: (eliquis) Other (Comment)  Taken 11/03/2022 0000 by Jodi Mourning, RN  Anti-Embolism Intervention: (eliquis) Other (Comment)  Taken 11/02/2022 2200 by Jodi Mourning, RN  Anti-Embolism Intervention: (eliquis) Other (Comment)  Taken 11/02/2022 2000 by Jodi Mourning, RN  Anti-Embolism Intervention: (eliquis) Other (Comment)  Intervention: Prevent Infection  Recent Flowsheet Documentation  Taken 11/02/2022 2000 by Jodi Mourning, RN  Infection Prevention: environmental surveillance performed  Goal: Optimal Comfort and Wellbeing  Outcome: Ongoing - Unchanged  Goal: Readiness for Transition of Care  Outcome: Ongoing - Unchanged  Goal: Rounds/Family Conference  Outcome: Ongoing - Unchanged     Problem: Infection  Goal: Absence of Infection Signs and Symptoms  Outcome: Ongoing - Unchanged  Intervention: Prevent or Manage Infection  Recent Flowsheet Documentation  Taken 11/02/2022 2000 by Jodi Mourning, RN  Infection Management: aseptic technique maintained     Problem: Self-Care Deficit  Goal: Improved Ability to Complete Activities of Daily Living  Outcome: Ongoing - Unchanged     Problem: VTE (Venous Thromboembolism)  Goal: Tissue Perfusion  Outcome: Ongoing - Unchanged  Intervention: Optimize Tissue Perfusion  Recent Flowsheet Documentation  Taken 11/03/2022 0420 by Jodi Mourning, RN  Anti-Embolism Intervention: (eliquis) Other (Comment)  Taken 11/03/2022 0400 by Jodi Mourning, RN  Anti-Embolism Intervention: (eliquis) Other (Comment)  Taken 11/03/2022 0200 by Jodi Mourning, RN  Anti-Embolism Intervention: (eliquis) Other (Comment)  Taken 11/03/2022 0000 by Sharen Heck D, RN  Anti-Embolism Intervention: (eliquis) Other (Comment)  Taken 11/02/2022 2200 by Jodi Mourning, RN  Anti-Embolism Intervention: (eliquis) Other (Comment)  Taken 11/02/2022 2000 by Jodi Mourning, RN  Bleeding Precautions:   blood pressure closely monitored   coagulation study results reviewed   monitored for signs of bleeding  Anti-Embolism Intervention: (eliquis) Other (Comment)  Goal: Right Ventricular Function  Outcome: Ongoing - Unchanged     Problem: Wound  Goal: Optimal Coping  Outcome: Ongoing - Unchanged  Goal: Optimal Functional Ability  Outcome: Ongoing - Unchanged  Goal: Absence of Infection Signs and Symptoms  Outcome: Ongoing - Unchanged  Intervention: Prevent or Manage Infection  Recent Flowsheet Documentation  Taken 11/02/2022 2000  by Jodi Mourning, RN  Infection Management: aseptic technique maintained  Goal: Improved Oral Intake  Outcome: Ongoing - Unchanged  Goal: Optimal Pain Control and Function  Outcome: Ongoing - Unchanged  Goal: Skin Health and Integrity  Outcome: Ongoing - Unchanged  Goal: Optimal Wound Healing  Outcome: Ongoing - Unchanged

## 2022-11-04 LAB — COMPREHENSIVE METABOLIC PANEL
ALBUMIN: 2.2 g/dL — ABNORMAL LOW (ref 3.4–5.0)
ALKALINE PHOSPHATASE: 796 U/L — ABNORMAL HIGH (ref 46–116)
ALT (SGPT): 170 U/L — ABNORMAL HIGH (ref 10–49)
ANION GAP: 7 mmol/L (ref 5–14)
AST (SGOT): 315 U/L — ABNORMAL HIGH (ref <=34)
BILIRUBIN TOTAL: 6.9 mg/dL — ABNORMAL HIGH (ref 0.3–1.2)
BLOOD UREA NITROGEN: 41 mg/dL — ABNORMAL HIGH (ref 9–23)
BUN / CREAT RATIO: 21
CALCIUM: 8.2 mg/dL — ABNORMAL LOW (ref 8.7–10.4)
CHLORIDE: 112 mmol/L — ABNORMAL HIGH (ref 98–107)
CO2: 19 mmol/L — ABNORMAL LOW (ref 20.0–31.0)
CREATININE: 1.96 mg/dL — ABNORMAL HIGH
EGFR CKD-EPI (2021) MALE: 35 mL/min/{1.73_m2} — ABNORMAL LOW (ref >=60)
GLUCOSE RANDOM: 74 mg/dL (ref 70–179)
POTASSIUM: 4.4 mmol/L (ref 3.4–4.8)
PROTEIN TOTAL: 5.4 g/dL — ABNORMAL LOW (ref 5.7–8.2)
SODIUM: 138 mmol/L (ref 135–145)

## 2022-11-04 LAB — CBC W/ AUTO DIFF
BASOPHILS ABSOLUTE COUNT: 0 10*9/L (ref 0.0–0.1)
BASOPHILS RELATIVE PERCENT: 0.6 %
EOSINOPHILS ABSOLUTE COUNT: 0.1 10*9/L (ref 0.0–0.5)
EOSINOPHILS RELATIVE PERCENT: 1.4 %
HEMATOCRIT: 43.4 % (ref 39.0–48.0)
HEMOGLOBIN: 14.5 g/dL (ref 12.9–16.5)
LYMPHOCYTES ABSOLUTE COUNT: 0.8 10*9/L — ABNORMAL LOW (ref 1.1–3.6)
LYMPHOCYTES RELATIVE PERCENT: 12.3 %
MEAN CORPUSCULAR HEMOGLOBIN CONC: 33.4 g/dL (ref 32.0–36.0)
MEAN CORPUSCULAR HEMOGLOBIN: 27.5 pg (ref 25.9–32.4)
MEAN CORPUSCULAR VOLUME: 82.4 fL (ref 77.6–95.7)
MEAN PLATELET VOLUME: 9.4 fL (ref 6.8–10.7)
MONOCYTES ABSOLUTE COUNT: 0.5 10*9/L (ref 0.3–0.8)
MONOCYTES RELATIVE PERCENT: 8.9 %
NEUTROPHILS ABSOLUTE COUNT: 4.7 10*9/L (ref 1.8–7.8)
NEUTROPHILS RELATIVE PERCENT: 76.8 %
PLATELET COUNT: 67 10*9/L — ABNORMAL LOW (ref 150–450)
RED BLOOD CELL COUNT: 5.27 10*12/L (ref 4.26–5.60)
RED CELL DISTRIBUTION WIDTH: 22.8 % — ABNORMAL HIGH (ref 12.2–15.2)
WBC ADJUSTED: 6.2 10*9/L (ref 3.6–11.2)

## 2022-11-04 LAB — LACTATE DEHYDROGENASE: LACTATE DEHYDROGENASE: 1551 U/L — ABNORMAL HIGH (ref 120–246)

## 2022-11-04 LAB — HAPTOGLOBIN
HAPTOGLOBIN: <10 mg/dL — ABNORMAL LOW (ref 30–200)
HAPTOGLOBIN: 15 mg/dL — ABNORMAL LOW (ref 30–200)

## 2022-11-04 LAB — APTT
APTT: 43.6 s — ABNORMAL HIGH (ref 24.8–38.4)
HEPARIN CORRELATION: 0.2

## 2022-11-04 LAB — PROTIME-INR
INR: 2.3
INR: 2.57
PROTIME: 25.1 s — ABNORMAL HIGH (ref 9.9–12.6)
PROTIME: 28 s — ABNORMAL HIGH (ref 9.9–12.6)

## 2022-11-04 LAB — FIBRINOGEN: FIBRINOGEN LEVEL: 633 mg/dL — ABNORMAL HIGH (ref 175–500)

## 2022-11-04 LAB — MAGNESIUM: MAGNESIUM: 2.3 mg/dL (ref 1.6–2.6)

## 2022-11-04 LAB — D-DIMER, QUANTITATIVE: D-DIMER QUANTITATIVE (CW,ML,HL,HS,CH,JS,JC,RX,RH): 4968 ng{FEU}/mL — ABNORMAL HIGH (ref <=500)

## 2022-11-04 LAB — SLIDE REVIEW

## 2022-11-04 LAB — PHOSPHORUS: PHOSPHORUS: 2.5 mg/dL (ref 2.4–5.1)

## 2022-11-04 LAB — FACTOR 8 ACTIVITY: FACTOR VIII ACTIVITY: >400 % — ABNORMAL HIGH (ref 56.0–186.0)

## 2022-11-04 LAB — FACTOR 7 ACTIVITY: FACTOR VII ACTIVITY: 24 % — ABNORMAL LOW (ref 61–146)

## 2022-11-04 MED ADMIN — clopidogrel (PLAVIX) tablet 75 mg: 75 mg | ORAL | @ 14:00:00 | Stop: 2022-11-04

## 2022-11-04 MED ADMIN — apixaban (ELIQUIS) tablet 2.5 mg: 2.5 mg | ORAL | @ 14:00:00 | Stop: 2022-11-04

## 2022-11-04 MED ADMIN — predniSONE (DELTASONE) tablet 10 mg: 10 mg | ORAL | @ 14:00:00

## 2022-11-04 MED ADMIN — loperamide (IMODIUM) capsule 2 mg: 2 mg | ORAL | @ 11:00:00

## 2022-11-04 MED ADMIN — escitalopram oxalate (LEXAPRO) tablet 20 mg: 20 mg | ORAL | @ 14:00:00

## 2022-11-04 MED ADMIN — finasteride (PROSCAR) tablet 5 mg: 5 mg | ORAL | @ 14:00:00

## 2022-11-04 MED ADMIN — amlodipine (NORVASC) tablet 5 mg: 5 mg | ORAL | @ 14:00:00

## 2022-11-04 MED ADMIN — mirtazapine (REMERON) tablet 22.5 mg: 22.5 mg | ORAL | @ 02:00:00

## 2022-11-04 MED ADMIN — carvedilol (COREG) tablet 12.5 mg: 12.5 mg | ORAL | @ 02:00:00

## 2022-11-04 MED ADMIN — LORazepam (ATIVAN) tablet 0.5 mg: .5 mg | ORAL | @ 14:00:00 | Stop: 2022-11-04

## 2022-11-04 MED ADMIN — apixaban (ELIQUIS) tablet 2.5 mg: 2.5 mg | ORAL | @ 02:00:00

## 2022-11-04 MED ADMIN — tamsulosin (FLOMAX) 24 hr capsule 0.4 mg: .4 mg | ORAL | @ 14:00:00

## 2022-11-04 MED ADMIN — lactated Ringers infusion: 100 mL/h | INTRAVENOUS | @ 11:00:00 | Stop: 2022-11-04

## 2022-11-04 MED ADMIN — loperamide (IMODIUM) capsule 2 mg: 2 mg | ORAL | @ 17:00:00

## 2022-11-04 MED ADMIN — nicotine (NICODERM CQ) 21 mg/24 hr patch 1 patch: 1 | TRANSDERMAL | @ 14:00:00

## 2022-11-04 MED ADMIN — lidocaine 4 % patch 1 patch: 1 | TRANSDERMAL | @ 14:00:00

## 2022-11-04 MED ADMIN — acetaminophen (TYLENOL) tablet 650 mg: 650 mg | ORAL | @ 11:00:00

## 2022-11-04 MED ADMIN — loperamide (IMODIUM) capsule 2 mg: 2 mg | ORAL | @ 22:00:00

## 2022-11-04 MED ADMIN — ezetimibe (ZETIA) tablet 20 mg: 20 mg | ORAL | @ 14:00:00 | Stop: 2022-11-04

## 2022-11-04 MED ADMIN — diclofenac sodium (VOLTAREN) 1 % gel 2 g: 2 g | TOPICAL | @ 22:00:00

## 2022-11-04 MED ADMIN — levothyroxine (SYNTHROID) tablet 100 mcg: 100 ug | ORAL | @ 11:00:00 | Stop: 2022-11-04

## 2022-11-04 MED ADMIN — pantoprazole (Protonix) EC tablet 40 mg: 40 mg | ORAL | @ 14:00:00

## 2022-11-04 MED ADMIN — carvedilol (COREG) tablet 12.5 mg: 12.5 mg | ORAL | @ 14:00:00 | Stop: 2022-11-04

## 2022-11-04 MED ADMIN — vancomycin (VANCOCIN) 750 mg in sodium chloride (NS) 0.9% 250 mL IVPB: 750 mg | INTRAVENOUS | @ 02:00:00 | Stop: 2022-11-03

## 2022-11-04 MED ADMIN — lactated Ringers infusion: 100 mL/h | INTRAVENOUS | @ 07:00:00 | Stop: 2022-11-04

## 2022-11-04 NOTE — Unmapped (Signed)
Internal Medicine (MEDU) Progress Note    Assessment & Plan:   Christopher Brennan is a 77 y.o. 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 *      Active Problems    Liver Masses c/f metastatic disease  Patient with transaminitis with CT findings concerning for metastatic disease involving numerous liver masses; spleen and lung involvement with enlarged periportal lymph nodes. Gallbladder normal and no ductal dilation on initial imaging although LFTs/Bili continues to uptrend this hospital stay raising concerns for biliary obstruction likely related to mass effect. 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 Patient family updated. Med Onc consulted with recommendation for MRI. Given current AKI, held on 3/15 for monitoring of renal function.   - Med Onc consulted  - s/p liver biopsy, pathology with small cell neuroendocrine carcinoma  - Consider MRI Brain w/wo on 3/16 pending renal function  -If persistent AKI discuss MRI Brain wo contrast with Med Onc  -Daily CMP    Transaminitis/Hyperbilirubinemia  As above. Biliary GI consulted and MRCP performed on 3/12 showing no evidence of biliary duct dilation, stones or biliary obstruction. Gallbladder wall edema and periportal edema likely secondary to underlying liver dx. Bilirubin increasing, TB 6.5, DB 5.2 despite normal MRCP findings. C/f worsening liver dysfunction.   -Continue to trend LFTs, PT-INR  - Med/Onc consulted, appreciate recs regarding biopsy results as above.     Diarrhea  Presenting complaint complicated by hypovolemia and dehydration. Patient continues with diarrhea although with some improvement. Present for at least 3 weeks now. GI pathogen panel and C. difficile negative. CT AP without obvious inflammatory changes although noted fatty infiltration of the ascending and transverse colonic wall, most likely related to sequelae of chronic inflammation.  If no resolution will need a broader work up for chronic diarrhea including C-scope. No colonoscopy records in chart. T4/TSH normal. Additionally stool studies sent and pending.  - Continue mIVF  - Scheduled imodium TID  - Follow-up stool studies     AKI on CKD 3b (GFR 30-45)  Metabolic Acidosis without AG  BL 1.5-1.7; presented with AKI in setting of diarrhea and hypovolemia. Improved with fluids although most recently again uptreding after fluid discontinuation. Ongoing rise in Cr to 2.15. Will continue IVF with ongoing close monitoring.   - Daily BMP  - Continue mIVF at 175ml/hr given uptrending Cr  - Renally dose meds  - Avoid nephrotoxins (including NSAIDs, contrasted studies, fleets enemas)    Ventricular Tachycardia   Episode of 5 beats of VT noted on telemetry ON on 3/13. Patient asymptomatic and HDS. No further episodes.  SVT x 8 seconds overnight.   - Continue telemetry  - Replete electrolytes as needed    RUQ Pain  Ongoing intermittent RUQ pain, acutely worse night of 3/12 with radiation to chest. Evaluated for ACS given cardiac history and improvement with SLN although EKG unchanged from prior and troponin x 2 negative. Ultimately concern remains for pain associated with his liver pathology as above.   -see above for work up.    Thrombocytopenia  Presented with mild decrease in platelets to 114 on admission.. Lower concern for HIT as he was already low on presentation. No evidence of hemolysis given normal haptoglobin (LDH significantly elevated although suspect this may be due to malignancy). B12/Folate normal.  - Daily CBC    Elevated Lactate (resolved) - Hypovolemia  Elevated lactate resolved after IV fluids. Elevated lactate most likely in the setting of hypovolemia secondary to diarrhea.  Less suspicion for urosepsis given patient is asymptomatic and urine culture results are not significant.  Will proceed with the following:  - IV fluids  - Follow Bcx - NGTD    Hypophosphatemia  Monitoring and repleating.    Hypertension  At home on Coreg 12.5BID and Amlodipine 5QD. Initially held in setting on hypotension but later hypertensive. BP now improving with resumption of home meds.   -Coreg increased to 12.5mg  daily  -Home Amlodipine 5 mg daily added 3/13    Urine Culture w/ E. faecalis  Urine culture with 10-50k colonies of Enterococcus faecalis; given immunosuppressed state (on Prednisone and Cellcept), bladder outlet obstruction with intermittent catheterization, and recent hematuria he will complete 7 days of treatment (s/p 3 days of Zosyn now transitioned to Vancomycin).  -s/p 7 day course with Zosyn and Vancomycin    Upper Lobe Mass R Lung  Patient with known tobacco use history. 1ppd since teenager. CT scan showing 4.3 cm posterior RUL mass with multiple nodules. Unclear if this could be primary site.  -- Liver biopsy result as above      Irregular Thickening of Bladder Wall   Concerning for another possible source of malignancy, as bladder cancer can spread to liver and lung. Of note, patient had various episodes of gross hematuria, although U/A showed a decrease in RBCs from prior sample at admission. Urology consulted, appreciate evaluation and recommendations. Stated that they would proceed with a bladder scan to make sure he is not retaining and urine studies first as the rest of the workup can be done in the outpatient setting, including cystoscopy. They would recommend proceeding first with the liver biopsy as already planned.  --Urology consulted, will follow up outpatient.  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 Currently on thyroid and adrenal replacement.   -Continue Prednisone 10 mg daily - (patient will need stress dose steroids if acute decompensation/sepsis/surgery)  -Continue home Levothyroxine 100 mcg daily     Aortic Aneurysm 4.6 cm - Dissection Common Illiac Artery  Was seen in cardiology clinic 2/8 and had CTA done showing increased abdominal aortic aneurysm up to 4.6 cm and new short segment dissection flap in R common iliac artery, with unchanged dissection in L common iliac artery.   -- Vascular surgery referral for the  outpatient setting    LE swelling, bilateral   Patient with bilateral LE swelling on 3/12, in the setting of fluid resuscitation for AKI. Due to extensive CAD hx, c/f DVT. Stable. PVLs remain pending.   - Follow-up b/l LE PVLs  - Continue home Plavix and Eliquis    CAD s/p PCI to distal LAD and OM1 in 05/2022  Severe CAD with recent intervention as above. On Plavix alongside eliquis as below. On zetia, evolocumab. Resumed plavix 3/13.    DVT hx: Continue home Eliquis 2.5 mg BID    CIDP: Holding Cellcept, also treated with IVIG in the outpatient setting. Followed by neurology.  -Cellcept on hold here while treated for infection    Restless leg: Lyrica 75 mg nightly; held given uptrending Cr.     IgG-lambda monoclonal gammopathy/MGUS    - Follows with Dr. Barbette Merino. Under evaluation for possible POEMS given associated polyneuropathy although this has been felt to be less likely.    HLD  Continue home Zetia; evolocumab not on formulary.    Issues Impacting Complexity of Management:  The patient's presentation is complicated by the following clinically significant conditions requiring additional evaluation and treatment: - Hypercoagulable state requiring additional attention to DVT prophylaxis and treatment  - Chronic kidney disease POA requiring further investigation, treatment, or monitoring   - Volume depletion POA requiring further investigation, treatment, or monitoring  - Metastatic cancer POA requiring further investigation, treatment, or monitoring      Issues Impacting Complexity of Management:  -Discussion of risks/benefits of biopsy of lung or liver  with the patient including risk of risk factor: hemorrhage, infection, and medical complications.     Medical Decision Making: Reviewed records from the following unique sources  outpatient heme/onc, neurology, cardiology visit.      Daily Checklist:  Diet: Regular Diet  DVT PPx: Patient Already on Full Anticoagulation with Eliquis.  Code Status: Full Code  Dispo:  Continue current level of care    Team Contact Information:   Primary Team: Internal Medicine (MEDU)  Primary Resident: Jeri Modena, MD  Resident's Pager: [phone] [facility] MedU Senior Resident)    Interval History:   No acute events overnight. Resting when seen in the AM.    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 Patient and family updated. Med Onc with recommendation for Brain MRI while IP. Pending improvement in renal function given preference for contrast use.    ROS: Denies headache, chest pain, shortness of breath, abdominal pain, nausea, vomiting.    Objective:   Temp:  [36.5 ??C (97.7 ??F)-36.6 ??C (97.9 ??F)] 36.6 ??C (97.9 ??F)  Heart Rate:  [56-69] 59  Resp:  [16-18] 16  BP: (110-134)/(62-68) 126/68  SpO2:  [94 %-97 %] 97 %    Gen: Sitting up in bed, in NAD, conversant   HENT: Atraumatic, normocephalic  Heart: RRR, distal extremities wrm  Lungs: CTAB, no crackles or wheezes  Abdomen: Soft, non-distended. RUQ tenderness to palpation.   Skin:  Scattered crustly skin lesions, known skin cancer per patient   Extremities: 1+ Bilateral LE edema (stable)    Jeri Modena, MD PGY3

## 2022-11-04 NOTE — Unmapped (Signed)
Vancomycin Therapeutic Monitoring Pharmacy Note    Christopher Brennan is a 77 y.o. male starting vancomycin. Date of therapy initiation: 10/30/22    Indication: Urinary Tract Infection (UTI)    Prior Dosing Information: None/new initiation     Goals:  Therapeutic Drug Levels  Vancomycin trough goal: 10-15 mg/L    Additional Clinical Monitoring/Outcomes  Renal function, volume status (intake and output)    Results: Not applicable    Wt Readings from Last 1 Encounters:   10/28/22 (!) 101.5 kg (223 lb 12.8 oz)     Creatinine   Date Value Ref Range Status   11/03/2022 2.15 (H) 0.73 - 1.18 mg/dL Final   16/05/9603 5.40 (H) 0.73 - 1.18 mg/dL Final   98/06/9146 8.29 (H) 0.73 - 1.18 mg/dL Final        Pharmacokinetic Considerations and Significant Drug Interactions:  Adult (estimated initial): Vd = 72 L, ke = 0.034 hr-1  Concurrent nephrotoxic meds: recent contrast dye    Assessment/Plan:  Vanc level = 17.46mcg/mL at 2149    Recommendation(s)  Vancomycin - TO be stopped after 3/16 dose  Recommend decr to 750mg  x 1 given rising scr    Follow-up  Level due: none at this time  A pharmacist will continue to monitor and order levels as appropriate    Please page service pharmacist with questions/clarifications.    Drue Flirt, [facility]

## 2022-11-04 NOTE — Unmapped (Signed)
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 Incidentally found to have a lung mass with metastasis on imaging. Oncology was consulted for evaluation of New mMetastatic Small cell neuroendocrine carcinoma of the lung.       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 He will benefit from outpatient follow up with oncology for chemotherapy and brain imaging. Given this is metastatic disease goal of treatment is to prevent further metastasis and slow disease progression to prolong life/ increase QoL but not cure.      Recommendations:   - brain MRI w contrast  - outpatient oncology follow up for treatment  - optimize performance status; PT/OT/nutrition    Plan discussed with patient, son, daughter in-law. Questions answered and they agree with the plan    This patient has been staffed with Dr. Dorna Mai These recommendations were discussed with the primary team.     Please contact the oncology consult fellow at [phone] with any further questions.    Emmit Alexanders, MD  Oncology Fellow    -------------------------------------------------------------    HPI: Christopher Brennan is a 77 y.o. 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 He initially presented to [facility] ED for ongoing generalized weakness, lightheadedness, hypotension, diarrhea, poor appetite for few weeks. He reported being relatively well about 3 weeks prior to onset of symptoms.  Also with RUQ pain. Stable weight. Today he still is very weak, pain controlled with pain medication given in the hospital and requires assistance on a walker to to to the toilet. No fever or chills.    Review of Systems: All positive and pertinent negatives are noted in the HPI; a 10 system review of systems was otherwise negative except as noted in HPI.    Oncologic History:  Hematology/Oncology History    No history exists.       Past Medical History:   Diagnosis Date    Angina pectoris (CMS-HCC) 06/24/2018    Added automatically from request for surgery [id number]    Anxiety     CAD S/P percutaneous coronary angioplasty     GERD (gastroesophageal reflux disease)     Hypercholesteremia     Hypertension     Myocardial infarction (CMS-HCC)     3 MIs    Panhypopituitarism (CMS-HCC)     Tobacco abuse     Unstable angina (CMS-HCC) 04/05/2018       Past Surgical History:   Procedure Laterality Date    CARDIAC SURGERY      PR CATH PLACE/CORON ANGIO, IMG SUPER/INTERP,W LEFT HEART VENTRICULOGRAPHY N/A 10/29/2015    Procedure: Left Heart Catheterization W Intervention;  Surgeon: Orpha Bur, MD;  Location: [facility] CATH;  Service: Cardiology  PR CATH PLACE/CORON ANGIO, IMG SUPER/INTERP,W LEFT HEART VENTRICULOGRAPHY N/A 08/01/2016    Procedure: Left Heart Catheterization W Intervention;  Surgeon: Alvira Philips, MD;  Location: [facility] CATH;  Service: Cardiology    PR CATH PLACE/CORON ANGIO, IMG SUPER/INTERP,W LEFT HEART VENTRICULOGRAPHY N/A 03/05/2017    Procedure: Left Heart Catheterization W Intervention;  Surgeon: Alvira Philips, MD;  Location: [facility] CATH;  Service: Cardiology    PR CATH PLACE/CORON ANGIO, IMG SUPER/INTERP,W LEFT HEART VENTRICULOGRAPHY N/A 08/06/2017    Procedure: CATH LEFT HEART CATHETERIZATION W INTERVENTION;  Surgeon: Alvira Philips, MD;  Location: [facility] CATH;  Service: Cardiology    PR CATH PLACE/CORON ANGIO, IMG SUPER/INTERP,W LEFT HEART VENTRICULOGRAPHY N/A 04/09/2018    Procedure: Left Heart Catheterization W Intervention;  Surgeon: Alvira Philips, MD;  Location: [facility] CATH;  Service: Cardiology    PR CATH PLACE/CORON ANGIO, IMG SUPER/INTERP,W LEFT HEART VENTRICULOGRAPHY N/A 07/03/2018    Procedure: CATH LEFT HEART CATHETERIZATION W INTERVENTION;  Surgeon: Alvira Philips, MD;  Location: [facility] CATH;  Service: Cardiology    PR CATH PLACE/CORON ANGIO, IMG SUPER/INTERP,W LEFT HEART VENTRICULOGRAPHY N/A 10/29/2019    Procedure: Left Heart Catheterization;  Surgeon: Alvira Philips, MD;  Location: [facility] CATH;  Service: Cardiology    PR CATH PLACE/CORON ANGIO, IMG SUPER/INTERP,W LEFT HEART VENTRICULOGRAPHY N/A 01/01/2020    Procedure: Left Heart Catheterization W Intervention;  Surgeon: Rosana Hoes, MD;  Location: [facility] CATH;  Service: Cardiology    PR CATH PLACE/CORON ANGIO, IMG SUPER/INTERP,W LEFT HEART VENTRICULOGRAPHY N/A 05/22/2022    Procedure: Left Heart Catheterization;  Surgeon: Alvira Philips, MD;  Location: [facility] CATH;  Service: Cardiology       Family History   Problem Relation Age of Onset    Hypertension Father     Cancer Sister     Hypertension Sister     Cancer Brother     Coronary artery disease Paternal Uncle     Anemia Neg Hx     Diabetes Neg Hx     Kidney disease Neg Hx     Obesity Neg Hx     Thyroid disease Neg Hx     Osteoporosis Neg Hx           Social History     Socioeconomic History    Marital status: Widowed   Tobacco Use    Smoking status: Every Day     Current packs/day: 1.50     Average packs/day: 1.5 packs/day for 60.0 years (90.0 ttl pk-yrs)     Types: Cigarettes    Smokeless tobacco: Former    Tobacco comments:     1-1.5ppd. Aims to reduce use.   Vaping Use    Vaping status: Never Used   Substance and Sexual Activity    Alcohol use: No     Alcohol/week: 0.0 standard drinks of alcohol    Drug use: Not Currently    Sexual activity: Not Currently     Social Determinants of Health     Financial Resource Strain: Low Risk  (10/30/2022)    Overall Financial Resource Strain (CARDIA)     Difficulty of Paying Living Expenses: Not hard at all   Food Insecurity: No Food Insecurity (10/30/2022)    Hunger Vital Sign     Worried About Running Out of Food in the Last Year: Never true     Ran Out of Food in the Last Year: Never true   Transportation Needs: No Transportation Needs (10/30/2022)    PRAPARE -  Therapist, art (Medical): No     Lack of Transportation (Non-Medical): No       Social History     Social History Narrative    Not on file       Allergies: is allergic to statins-hmg-coa reductase inhibitors and doxycycline.    Medications:   Meds:   amlodipine  5 mg Oral Daily    apixaban  2.5 mg Oral BID    carvedilol  12.5 mg Oral BID    clopidogrel  75 mg Oral Daily    escitalopram oxalate  20 mg Oral Daily    ezetimibe  20 mg Oral Daily    finasteride  5 mg Oral Daily    levothyroxine  100 mcg Oral daily    lidocaine  1 patch Transdermal Daily    loperamide  2 mg Oral TID    LORazepam  0.5 mg Oral BID    mirtazapine  22.5 mg Oral Nightly    [Provider Hold] mycophenolate  1,000 mg Oral BID    nicotine  1 patch Transdermal Daily    pantoprazole  40 mg Oral Daily    predniSONE  10 mg Oral Daily    tamsulosin  0.4 mg Oral Daily     Continuous Infusions:  PRN Meds:.acetaminophen, calcium carbonate, dibucaine, nitroglycerin, ondansetron **OR** ondansetron    Objective:   Vitals: Temp:  [36.5 ??C (97.7 ??F)-36.6 ??C (97.9 ??F)] 36.6 ??C (97.9 ??F)  Heart Rate:  [56-69] 64  Resp:  [16-18] 18  BP: (110-137)/(64-76) 137/76  MAP (mmHg):  [78-92] 92  SpO2:  [95 %-97 %] 96 %  No intake/output data recorded.    Physical Exam:  BP 137/76  - Pulse 64  - Temp 36.6 ??C (97.9 ??F) (Oral)  - Resp 18  - Ht 182.9 cm (6')  - Wt (!) 101.5 kg (223 lb 12.8 oz)  - SpO2 96%  - BMI 30.35 kg/m??    GEN: sick looking but stable, NAD  RESP: normal work of breathing  CVS: RRR      ECOG Performance Status: 3 - Capable of only limited selfcare, confined to bed or chair more than 50% of waking hours    Test Results  Lab Results   Component Value Date    WBC 6.7 11/03/2022    HGB 14.9 11/03/2022    HCT 45.3 11/03/2022    PLT 67 (L) 11/03/2022     Lab Results   Component Value Date    NA 138 11/03/2022    K 4.8 11/03/2022    CL 113 (H) 11/03/2022    CO2 18.0 (L) 11/03/2022    BUN 42 (H) 11/03/2022    CREATININE 2.15 (H) 11/03/2022    GLU 78 11/03/2022    CALCIUM 8.3 (L) 11/03/2022    MG 2.5 11/03/2022    PHOS 1.7 (L) 11/03/2022     Lab Results   Component Value Date    BILITOT 6.6 (H) 11/03/2022    BILIDIR 5.20 (H) 11/01/2022    PROT 5.7 11/03/2022    ALBUMIN 2.4 (L) 11/03/2022    ALT 201 (H) 11/03/2022    AST 409 (H) 11/03/2022    ALKPHOS 686 (H) 11/03/2022    GGT 30 05/02/2011     Lab Results   Component Value Date    INR 1.20 10/29/2022    APTT 44.0 (H) 11/02/2022       Imaging: Radiology studies were personally reviewed  Chest CT 10/27/22  4.3 cm malignant posterior right upper lobe mass with multiple bilateral subcentimeter pulmonary nodules measuring up to 0.7 cm, may represent pulmonary metastasis. Recommend PET/CT and tissue sampling for further assessment.      Multiple enlarged mediastinal and right hilar lymph nodes measuring up to 2.4 cm, consistent with nodal metastasis.      Tiny lytic lesions with remote fracture deformity and right lateral fourth rib. Recommend attention on PET/CT.      Subtle mild peripheral predominant reticulation in bilateral upper lobes, increased in prominence since 2019 and may represent a early interstitial lung abnormalities.       CTAP 10/27/22  Innumerable ill-defined masses throughout the liver and spleen with trace perihepatic ascites and associated enlarged periportal lymph node, concerning for metastatic disease. Tissue sampling recommended.      Pericardiac soft tissue nodule is concerning for metastatic deposit.      Irregular circumferential thickening of the bladder wall, most prominent within the bladder dome. Recommend urologic consultation and possible direct visualization.      Stable saccular infrarenal abdominal aneurysm measuring up to 4.6 cm since February 2024.

## 2022-11-04 NOTE — Unmapped (Signed)
Internal Medicine (MEDU) Progress Note    Assessment & Plan:   Christopher Brennan is a 77 y.o. 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 *      Active Problems    Liver Masses c/f metastatic disease  Patient with transaminitis with CT findings concerning for metastatic disease involving numerous liver masses; spleen and lung involvement with enlarged periportal lymph nodes. Gallbladder normal and no ductal dilation on initial imaging although LFTs/Bili continues to uptrend this hospital stay raising concerns for biliary obstruction likely related to mass effect. 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 Patient family updated. Med Onc consulted with recommendation for MRI. Given current AKI, held on 3/15 for monitoring of renal function. Cr 3/16 improved to 1.96.   - Med Onc consulted  - s/p liver biopsy, pathology with small cell neuroendocrine carcinoma  - Order brain MRI to evaluate for metastases as Cr is improved today   -Daily CMP    Transaminitis/Hyperbilirubinemia  As above. Biliary GI consulted and MRCP performed on 3/12 showing no evidence of biliary duct dilation, stones or biliary obstruction. Gallbladder wall edema and periportal edema likely secondary to underlying liver dx. Bilirubin increasing, TB 6.5, DB 5.2 despite normal MRCP findings. C/f worsening liver dysfunction. PT-INR elevated today to 25-2.30, c/f for worsening liver function and possible DIC.    - DIC workup  -Continue to trend LFTs, PT-INR  - Med/Onc consulted, appreciate recs regarding treatment options and goals of care.   - Hepatology consulted iso elevated coagulation labs     Elevated PT-INR  PT 13.4 -> 25.1, INR 1.20 -> 2.30 today 3/16. C/f worsening liver function iso metastatic malignancy with multiple liver lesions.   - DIC workup   - Hepatology consult  - Hold eliquis  - If patient becomes altered or has increasing confusion, obtain STAT CT head.  - Hold lyrica, ativan, mirtazpine to avoid build up iso liver dysfunction     Diarrhea  Presenting complaint complicated by hypovolemia and dehydration. Patient continues with diarrhea although with some improvement. Present for at least 3 weeks now. GI pathogen panel and C. difficile negative. CT AP without obvious inflammatory changes although noted fatty infiltration of the ascending and transverse colonic wall, most likely related to sequelae of chronic inflammation.  If no resolution will need a broader work up for chronic diarrhea including C-scope. No colonoscopy records in chart. T4/TSH normal. Additionally stool studies sent and pending.  - Continue mIVF  - Scheduled imodium TID  - Follow-up stool studies     AKI on CKD 3b (GFR 30-45)  Metabolic Acidosis without AG  BL 1.5-1.7; presented with AKI in setting of diarrhea and hypovolemia. Improved with fluids although most recently again uptreding after fluid discontinuation. Ongoing rise in Cr to 2.15. Will continue IVF with ongoing close monitoring. Cr improved to 1.96.   - Daily BMP  - Continue mIVF at 115ml/hr given uptrending Cr  - Renally dose meds  - Avoid nephrotoxins (including NSAIDs, contrasted studies, fleets enemas)    Ventricular Tachycardia   Episode of 5 beats of VT noted on telemetry ON on 3/13. Patient asymptomatic and HDS. No further episodes. SVT x 8 seconds overnight.   - Continue telemetry  - Replete electrolytes as needed    RUQ Pain  Ongoing intermittent RUQ pain, acutely worse night of 3/12 with radiation to chest. Evaluated for ACS given cardiac history and improvement with SLN although EKG unchanged from prior and troponin x 2 negative. Ultimately concern remains for pain associated with his liver pathology as above.   -see above for work up.    Thrombocytopenia  Presented with mild decrease in platelets to 114 on admission.. Lower concern for HIT as he was already low on presentation. No evidence of hemolysis given normal haptoglobin (LDH significantly elevated although suspect this may be due to malignancy). B12/Folate normal.  - Daily CBC    Elevated Lactate (resolved) - Hypovolemia  Elevated lactate resolved after IV fluids. Elevated lactate most likely in the setting of hypovolemia secondary to diarrhea.  Less suspicion for urosepsis given patient is asymptomatic and urine culture results are not significant.  Will proceed with the following:  - IV fluids  - Follow Bcx - NGTD    Hypophosphatemia  Monitoring and repleating.    Hypertension  At home on Coreg 12.5BID and Amlodipine 5QD. Initially held in setting on hypotension but later hypertensive. BP now improving with resumption of home meds.   -Coreg increased to 12.5mg  daily  -Home Amlodipine 5 mg daily added 3/13    Urine Culture w/ E. faecalis  Urine culture with 10-50k colonies of Enterococcus faecalis; given immunosuppressed state (on Prednisone and Cellcept), bladder outlet obstruction with intermittent catheterization, and recent hematuria he will complete 7 days of treatment (s/p 3 days of Zosyn now transitioned to Vancomycin).  -s/p 7 day course with Zosyn and Vancomycin    Upper Lobe Mass R Lung  Patient with known tobacco use history. 1ppd since teenager. CT scan showing 4.3 cm posterior RUL mass with multiple nodules. Unclear if this could be primary site.  -- Liver biopsy result as above      Irregular Thickening of Bladder Wall   Concerning for another possible source of malignancy, as bladder cancer can spread to liver and lung. Of note, patient had various episodes of gross hematuria, although U/A showed a decrease in RBCs from prior sample at admission. Urology consulted, appreciate evaluation and recommendations. Stated that they would proceed with a bladder scan to make sure he is not retaining and urine studies first as the rest of the workup can be done in the outpatient setting, including cystoscopy. They would recommend proceeding first with the liver biopsy as already planned.  --Urology consulted, will follow up outpatient.  -- Follow up urine cytology - negative for malignancy but with evidence of acute inflammation with bacterial and fungal organisms present  -- Continue bladder scans 4 x a day   - Straight cath as needed for BS's >300  -- Outpatient workup of hematuria, urology coordinating this     Panhypopituitarism  Secondary to brain trauma in the 1970s. Currently on thyroid and adrenal replacement.   -Continue Prednisone 10 mg daily - (patient will need stress dose steroids if acute decompensation/sepsis/surgery)  -Continue home Levothyroxine 100 mcg daily     Aortic Aneurysm 4.6 cm - Dissection Common Illiac Artery  Was seen in cardiology clinic 2/8 and had CTA done showing increased abdominal aortic aneurysm up to 4.6 cm and new short segment dissection flap in R common iliac artery, with unchanged dissection in L common iliac artery.   -- Vascular surgery referral for the outpatient setting    LE swelling, bilateral   Patient with bilateral LE swelling on 3/12, in the setting of fluid resuscitation for AKI. Due to extensive CAD hx, c/f DVT. Stable. PVLs remain pending.   - Follow-up b/l LE PVLs  - Continue home Plavix, hold Eliquis given increasing PT-INR     CAD s/p PCI to distal LAD and OM1 in 05/2022  Severe CAD with recent intervention as above. On Plavix at home. On zetia, evolocumab. Resumed plavix 3/13.    DVT hx: Hold home Eliquis 2.5 mg BID     CIDP: Holding Cellcept, also treated with IVIG in the outpatient setting. Followed by neurology.  -Cellcept on hold here while treated for infection    Restless leg: Hold Lyrica 75 mg nightly; held given c/f worsening liver function.    IgG-lambda monoclonal gammopathy/MGUS    - Follows with Dr. Barbette Merino. Under evaluation for possible POEMS given associated polyneuropathy although this has been felt to be less likely.    HLD  Continue home Zetia; evolocumab not on formulary.    Issues Impacting Complexity of Management:  The patient's presentation is complicated by the following clinically significant conditions requiring additional evaluation and treatment: - Hypercoagulable state requiring additional attention to DVT prophylaxis and treatment  - Chronic kidney disease POA requiring further investigation, treatment, or monitoring   - Volume depletion POA requiring further investigation, treatment, or monitoring  - Metastatic cancer POA requiring further investigation, treatment, or monitoring      Issues Impacting Complexity of Management:  -Discussion of risks/benefits of biopsy of lung or liver  with the patient including risk of risk factor: hemorrhage, infection, and medical complications.     Medical Decision Making: Reviewed records from the following unique sources  outpatient heme/onc, neurology, cardiology visit.      Daily Checklist:  Diet: Regular Diet  DVT PPx: Patient Already on Full Anticoagulation with Eliquis.  Code Status: Full Code  Dispo:  Continue current level of care    Team Contact Information:   Primary Team: Internal Medicine (MEDU)  Primary Resident: Sherrie George, MD  Resident's Pager: [phone] [facility] Med[facility] Senior Resident)    Interval History:   No acute events overnight. Resting comfortably in bed, reports no pain. He continues to have diarrhea.     Cr is slightly improved today so will order brain MRI. PT-INR elevated today, med/onc and hepatology consulted for guidance on treatment given c/f worsening liver function.     ROS: Denies headache, chest pain, shortness of breath, abdominal pain, nausea, vomiting.    Objective:   Temp:  [36.6 ??C (97.9 ??F)-37.2 ??C (99 ??F)] 37.2 ??C (99 ??F)  Heart Rate:  [59-64] 60  Resp:  [16-18] 18  BP: (126-137)/(68-76) 126/69  SpO2:  [96 %-97 %] 96 %    Gen: Sitting up in bed, in NAD, conversant   HENT: Atraumatic, normocephalic  Heart: RRR, distal extremities wrm  Lungs: CTAB, no crackles or wheezes  Abdomen: Soft, non-distended. RUQ tenderness to palpation.   Skin:  Scattered crustly skin lesions, known skin cancer per patient   Extremities: 1+ Bilateral LE edema (stable)    Gershon Cull, DO  PGY-1 Anesthesiology

## 2022-11-04 NOTE — Unmapped (Incomplete)
Patient A&Ox4. VSS on room air. No acute events overnight. Patient standby assist to bedside commode. Patient had multiple bowel movements overnight. Stool collection continues. Bed alarm on. Call bell and tray table within reach. Bed locked and in lowest position. Will continue to monitor.     Problem: Adult Inpatient Plan of Care  Goal: Plan of Care Review  Outcome: Ongoing - Unchanged  Goal: Patient-Specific Goal (Individualized)  Outcome: Ongoing - Unchanged  Goal: Absence of Hospital-Acquired Illness or Injury  Outcome: Ongoing - Unchanged  Intervention: Identify and Manage Fall Risk  Recent Flowsheet Documentation  Taken 11/03/2022 2000 by Jodi Mourning, RN  Safety Interventions:   environmental modification   fall reduction program maintained   lighting adjusted for tasks/safety   low bed   bed alarm   aspiration precautions   nonskid shoes/slippers when out of bed  Intervention: Prevent and Manage VTE (Venous Thromboembolism) Risk  Recent Flowsheet Documentation  Taken 11/04/2022 0400 by Jodi Mourning, RN  Anti-Embolism Intervention: (eliquis) Other (Comment)  Taken 11/04/2022 0200 by Jodi Mourning, RN  Anti-Embolism Intervention: (eliquis) Other (Comment)  Taken 11/04/2022 0000 by Jodi Mourning, RN  Anti-Embolism Intervention: (eliquis) Other (Comment)  Taken 11/03/2022 2200 by Jodi Mourning, RN  Anti-Embolism Intervention: (eliquis) Other (Comment)  Taken 11/03/2022 2000 by Jodi Mourning, RN  Anti-Embolism Intervention: (eliquis) Other (Comment)  Intervention: Prevent Infection  Recent Flowsheet Documentation  Taken 11/03/2022 2000 by Jodi Mourning, RN  Infection Prevention: environmental surveillance performed  Goal: Optimal Comfort and Wellbeing  Outcome: Ongoing - Unchanged  Goal: Readiness for Transition of Care  Outcome: Ongoing - Unchanged  Goal: Rounds/Family Conference  Outcome: Ongoing - Unchanged     Problem: Infection  Goal: Absence of Infection Signs and Symptoms  Outcome: Ongoing - Unchanged  Intervention: Prevent or Manage Infection  Recent Flowsheet Documentation  Taken 11/03/2022 2000 by Jodi Mourning, RN  Infection Management: aseptic technique maintained     Problem: Self-Care Deficit  Goal: Improved Ability to Complete Activities of Daily Living  Outcome: Ongoing - Unchanged     Problem: VTE (Venous Thromboembolism)  Goal: Tissue Perfusion  Outcome: Ongoing - Unchanged  Intervention: Optimize Tissue Perfusion  Recent Flowsheet Documentation  Taken 11/04/2022 0400 by Jodi Mourning, RN  Anti-Embolism Intervention: (eliquis) Other (Comment)  Taken 11/04/2022 0200 by Jodi Mourning, RN  Anti-Embolism Intervention: (eliquis) Other (Comment)  Taken 11/04/2022 0000 by Jodi Mourning, RN  Anti-Embolism Intervention: (eliquis) Other (Comment)  Taken 11/03/2022 2200 by Jodi Mourning, RN  Anti-Embolism Intervention: (eliquis) Other (Comment)  Taken 11/03/2022 2000 by Jodi Mourning, RN  Bleeding Precautions:   blood pressure closely monitored   coagulation study results reviewed   monitored for signs of bleeding  Anti-Embolism Intervention: (eliquis) Other (Comment)  Goal: Right Ventricular Function  Outcome: Ongoing - Unchanged     Problem: Wound  Goal: Optimal Coping  Outcome: Ongoing - Unchanged  Goal: Optimal Functional Ability  Outcome: Ongoing - Unchanged  Goal: Absence of Infection Signs and Symptoms  Outcome: Ongoing - Unchanged  Intervention: Prevent or Manage Infection  Recent Flowsheet Documentation  Taken 11/03/2022 2000 by Jodi Mourning, RN  Infection Management: aseptic technique maintained  Goal: Improved Oral Intake  Outcome: Ongoing - Unchanged  Goal: Optimal Pain Control and Function  Outcome: Ongoing - Unchanged  Goal: Skin Health and Integrity  Outcome: Ongoing - Unchanged  Goal: Optimal Wound Healing  Outcome: Ongoing - Unchanged  Problem: Fall Injury Risk  Goal: Absence of Fall and Fall-Related Injury  Outcome: Ongoing - Unchanged  Intervention: Promote Injury-Free Environment  Recent Flowsheet Documentation  Taken 11/03/2022 2000 by Jodi Mourning, RN  Safety Interventions:   environmental modification   fall reduction program maintained   lighting adjusted for tasks/safety   low bed   bed alarm   aspiration precautions   nonskid shoes/slippers when out of bed

## 2022-11-05 MED ORDER — LOPERAMIDE 2 MG CAPSULE
ORAL_CAPSULE | Freq: Four times a day (QID) | ORAL | 0 refills | 30.00000 days | Status: CP
Start: 2022-11-05 — End: 2022-12-05

## 2022-11-05 MED ORDER — DICLOFENAC 1 % TOPICAL GEL
Freq: Four times a day (QID) | TOPICAL | 0 refills | 13.00000 days | Status: CP | PRN
Start: 2022-11-05 — End: 2023-11-05

## 2022-11-05 MED ORDER — ONDANSETRON 4 MG DISINTEGRATING TABLET
ORAL_TABLET | Freq: Three times a day (TID) | ORAL | 0 refills | 8.00000 days | Status: CP | PRN
Start: 2022-11-05 — End: 2022-12-05

## 2022-11-05 MED ADMIN — escitalopram oxalate (LEXAPRO) tablet 20 mg: 20 mg | ORAL | @ 14:00:00 | Stop: 2022-11-05

## 2022-11-05 MED ADMIN — tamsulosin (FLOMAX) 24 hr capsule 0.4 mg: .4 mg | ORAL | @ 14:00:00 | Stop: 2022-11-05

## 2022-11-05 MED ADMIN — diclofenac sodium (VOLTAREN) 1 % gel 2 g: 2 g | TOPICAL | @ 05:00:00 | Stop: 2022-11-05

## 2022-11-05 MED ADMIN — nicotine (NICODERM CQ) 21 mg/24 hr patch 1 patch: 1 | TRANSDERMAL | @ 14:00:00 | Stop: 2022-11-05

## 2022-11-05 MED ADMIN — pantoprazole (Protonix) EC tablet 40 mg: 40 mg | ORAL | @ 14:00:00 | Stop: 2022-11-05

## 2022-11-05 MED ADMIN — acetaminophen (TYLENOL) tablet 650 mg: 650 mg | ORAL | @ 01:00:00

## 2022-11-05 MED ADMIN — diclofenac sodium (VOLTAREN) 1 % gel 2 g: 2 g | TOPICAL | @ 11:00:00 | Stop: 2022-11-05

## 2022-11-05 MED ADMIN — diclofenac sodium (VOLTAREN) 1 % gel 2 g: 2 g | TOPICAL | @ 01:00:00

## 2022-11-05 MED ADMIN — loperamide (IMODIUM) capsule 2 mg: 2 mg | ORAL | @ 10:00:00 | Stop: 2022-11-05

## 2022-11-05 MED ADMIN — lidocaine 4 % patch 1 patch: 1 | TRANSDERMAL | @ 14:00:00 | Stop: 2022-11-05

## 2022-11-05 MED ADMIN — loperamide (IMODIUM) capsule 2 mg: 2 mg | ORAL | @ 17:00:00 | Stop: 2022-11-05

## 2022-11-05 MED ADMIN — predniSONE (DELTASONE) tablet 10 mg: 10 mg | ORAL | @ 14:00:00 | Stop: 2022-11-05

## 2022-11-05 MED ADMIN — amlodipine (NORVASC) tablet 5 mg: 5 mg | ORAL | @ 14:00:00 | Stop: 2022-11-05

## 2022-11-05 MED ADMIN — finasteride (PROSCAR) tablet 5 mg: 5 mg | ORAL | @ 14:00:00 | Stop: 2022-11-05

## 2022-11-05 NOTE — Unmapped (Signed)
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 Incidentally found to have a lung mass with metastasis on imaging. 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     03/08 CT C/A/P: Remarkable for a 4.3 cm malignant posterior right upper lobe mass and  multiple lesions concerning for metastatic disease to b/l lung, mediastinum , pericardiac soft tissue, liver spleen and bone.  03/13 Pathology report from Liver biopsy:  Small cell neuroendocrine carcinoma  03/13 MRI abdomen: Diffuse replacement of the hepatic parenchyma by innumerable confluent ill-defined lesions, again concerning for diffuse hepatic metastatic disease. Heterogeneous diffusion restriction of the bone marrow with mild heterogeneous enhancement of the bones     LFTs are worsening, at this time: T. Bil 6.9, AST 315, ALT 170 and Alk Phosp 796.  Kidney function: creat 1.96, GFR 35.    Upon initial consultation on 03/25, it was discussed the option of outpatient follow-up hoping for the liver function to improve and proceed with chemotherapy. It was explained that given this is metastatic disease, the goal of treatment was palliative, but no cure. It was also discussed that while the disease is often responsive to initial therapy, it tends to return aggressively, and survival times tend to be poor outside of rare cases.     During re-evaluation and further discussion with the patient and family today, and given worsening COAG studies concerning deteriorating liver function, it was re-discussed that given the poor liver function, there is no role for chemotherapy at this time, since the agent he would require is metabolized mainly by the liver. His current bilirubin level is above the permissive level for dose adjustment. His deteriorated kidney function would also require dose adjustment of the therapy.      The family verbalized understanding and is open to Palliative Care consultation to discuss further goals of care and possible Hospice.     Recommendations:   - Systemic cancer directed therapy has no role at this time, as stated above.  - We agree with Palliative Care/ Hospice consult.  - If the patient's liver function improves, or the patient/family opts for further Medical Oncology evaluation, we will be pleased to follow up with him in the outpatient setting.    Plan discussed with patient, son, daughter in-law. Questions answered and they agree with the plan    This patient has been staffed with Dr. Dorna Mai.  These recommendations were discussed with the primary team.     Oncology will peripherally follow the patient in case new questions arise.    Please contact the oncology consult fellow at [phone] with further questions.    Glenn Gullickson B. Danelle Earthly, MD  Oncology Fellow    -------------------------------------------------------------    HPI: Christopher Brennan is a 77 y.o. 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 He initially presented to [facility] ED for ongoing generalized weakness, lightheadedness, hypotension, diarrhea, poor appetite for few weeks. He reported being relatively well about 3 weeks prior to onset of symptoms.  Also with RUQ pain. Stable weight. Today he still is very weak, pain controlled with pain medication given in the hospital and requires assistance on a walker to to to the toilet. No fever or chills.    Review of Systems: All positive and pertinent negatives are noted in the HPI; a 10 system review of systems was otherwise negative except as noted in HPI.    Oncologic History:  Hematology/Oncology History    No history exists.       Past Medical History:   Diagnosis Date    Angina pectoris (CMS-HCC) 06/24/2018    Added automatically from request for surgery [id number]    Anxiety     CAD S/P percutaneous coronary angioplasty     GERD (gastroesophageal reflux disease)     Hypercholesteremia     Hypertension     Myocardial infarction (CMS-HCC)     3 MIs    Panhypopituitarism (CMS-HCC)     Tobacco abuse     Unstable angina (CMS-HCC) 04/05/2018       Past Surgical History:   Procedure Laterality Date    CARDIAC SURGERY      PR CATH PLACE/CORON ANGIO, IMG SUPER/INTERP,W LEFT HEART VENTRICULOGRAPHY N/A 10/29/2015    Procedure: Left Heart Catheterization W Intervention;  Surgeon: Orpha Bur, MD;  Location: [facility] CATH;  Service: Cardiology    PR CATH PLACE/CORON ANGIO, IMG SUPER/INTERP,W LEFT HEART VENTRICULOGRAPHY N/A 08/01/2016    Procedure: Left Heart Catheterization W Intervention;  Surgeon: Alvira Philips, MD;  Location: [facility] CATH;  Service: Cardiology    PR CATH PLACE/CORON ANGIO, IMG SUPER/INTERP,W LEFT HEART VENTRICULOGRAPHY N/A 03/05/2017    Procedure: Left Heart Catheterization W Intervention;  Surgeon: Alvira Philips, MD;  Location: [facility] CATH;  Service: Cardiology    PR CATH PLACE/CORON ANGIO, IMG SUPER/INTERP,W LEFT HEART VENTRICULOGRAPHY N/A 08/06/2017    Procedure: CATH LEFT HEART CATHETERIZATION W INTERVENTION;  Surgeon: Alvira Philips, MD;  Location: [facility] CATH; Service: Cardiology    PR CATH PLACE/CORON ANGIO, IMG SUPER/INTERP,W LEFT HEART VENTRICULOGRAPHY N/A 04/09/2018    Procedure: Left Heart Catheterization W Intervention;  Surgeon: Alvira Philips, MD;  Location: [facility] CATH;  Service: Cardiology    PR CATH PLACE/CORON ANGIO, IMG SUPER/INTERP,W LEFT HEART VENTRICULOGRAPHY N/A 07/03/2018    Procedure: CATH LEFT HEART CATHETERIZATION W INTERVENTION;  Surgeon: Alvira Philips, MD;  Location: [facility] CATH;  Service: Cardiology    PR CATH PLACE/CORON ANGIO, IMG SUPER/INTERP,W LEFT HEART VENTRICULOGRAPHY N/A 10/29/2019    Procedure: Left Heart Catheterization;  Surgeon: Alvira Philips, MD;  Location: [facility] CATH;  Service: Cardiology    PR CATH PLACE/CORON ANGIO, IMG SUPER/INTERP,W LEFT HEART VENTRICULOGRAPHY N/A 01/01/2020    Procedure: Left Heart Catheterization W Intervention;  Surgeon: Rosana Hoes, MD;  Location: [facility] CATH;  Service: Cardiology    PR CATH PLACE/CORON ANGIO, IMG SUPER/INTERP,W LEFT HEART VENTRICULOGRAPHY N/A 05/22/2022    Procedure: Left Heart Catheterization;  Surgeon: Alvira Philips, MD;  Location: [facility] CATH;  Service: Cardiology       Family History   Problem  Relation Age of Onset    Hypertension Father     Cancer Sister     Hypertension Sister     Cancer Brother     Coronary artery disease Paternal Uncle     Anemia Neg Hx     Diabetes Neg Hx     Kidney disease Neg Hx     Obesity Neg Hx     Thyroid disease Neg Hx     Osteoporosis Neg Hx           Social History     Socioeconomic History    Marital status: Widowed   Tobacco Use    Smoking status: Every Day     Current packs/day: 1.50     Average packs/day: 1.5 packs/day for 60.0 years (90.0 ttl pk-yrs)     Types: Cigarettes    Smokeless tobacco: Former    Tobacco comments:     1-1.5ppd. Aims to reduce use.   Vaping Use    Vaping status: Never Used   Substance and Sexual Activity    Alcohol use: No     Alcohol/week: 0.0 standard drinks of alcohol    Drug use: Not Currently Sexual activity: Not Currently     Social Determinants of Health     Financial Resource Strain: Low Risk  (10/30/2022)    Overall Financial Resource Strain (CARDIA)     Difficulty of Paying Living Expenses: Not hard at all   Food Insecurity: No Food Insecurity (10/30/2022)    Hunger Vital Sign     Worried About Running Out of Food in the Last Year: Never true     Ran Out of Food in the Last Year: Never true   Transportation Needs: No Transportation Needs (10/30/2022)    PRAPARE - Therapist, art (Medical): No     Lack of Transportation (Non-Medical): No       Social History     Social History Narrative    Not on file       Allergies: is allergic to statins-hmg-coa reductase inhibitors and doxycycline.    Medications:   Meds:   amlodipine  5 mg Oral Daily    escitalopram oxalate  20 mg Oral Daily    finasteride  5 mg Oral Daily    lidocaine  1 patch Transdermal Daily    loperamide  2 mg Oral TID    nicotine  1 patch Transdermal Daily    pantoprazole  40 mg Oral Daily    predniSONE  10 mg Oral Daily    tamsulosin  0.4 mg Oral Daily     Continuous Infusions:  PRN Meds:.acetaminophen, calcium carbonate, dibucaine, diclofenac sodium, nitroglycerin, ondansetron **OR** ondansetron    Objective:   Vitals: Temp:  [36.6 ??C (97.9 ??F)-37.2 ??C (99 ??F)] 36.6 ??C (97.9 ??F)  Heart Rate:  [57-60] 57  Resp:  [18-20] 20  BP: (112-133)/(63-69) 133/63  MAP (mmHg):  [80-86] 84  SpO2:  [96 %-98 %] 96 %  No intake/output data recorded.    Physical Exam:  BP 133/63  - Pulse 57  - Temp 36.6 ??C (97.9 ??F) (Oral)  - Resp 20  - Ht 182.9 cm (6')  - Wt (!) 101.5 kg (223 lb 12.8 oz)  - SpO2 96%  - BMI 30.35 kg/m??    GEN: sick looking but stable, NAD  RESP: normal work of breathing  CVS: RRR      ECOG Performance Status: 3 - Capable of only limited selfcare,  confined to bed or chair more than 50% of waking hours    Test Results  Lab Results   Component Value Date    WBC 6.2 11/04/2022    HGB 14.5 11/04/2022    HCT 43.4 11/04/2022    PLT 67 (L) 11/04/2022     Lab Results   Component Value Date    NA 138 11/04/2022    K 4.4 11/04/2022    CL 112 (H) 11/04/2022    CO2 19.0 (L) 11/04/2022    BUN 41 (H) 11/04/2022    CREATININE 1.96 (H) 11/04/2022    GLU 74 11/04/2022    CALCIUM 8.2 (L) 11/04/2022    MG 2.3 11/04/2022    PHOS 2.5 11/04/2022     Lab Results   Component Value Date    BILITOT 6.9 (H) 11/04/2022    BILIDIR 5.20 (H) 11/01/2022    PROT 5.4 (L) 11/04/2022    ALBUMIN 2.2 (L) 11/04/2022    ALT 170 (H) 11/04/2022    AST 315 (H) 11/04/2022    ALKPHOS 796 (H) 11/04/2022    GGT 30 05/02/2011     Lab Results   Component Value Date    INR 2.57 11/04/2022    APTT 43.6 (H) 11/04/2022       Imaging: Radiology studies were personally reviewed     Chest CT 10/27/22  4.3 cm malignant posterior right upper lobe mass with multiple bilateral subcentimeter pulmonary nodules measuring up to 0.7 cm, may represent pulmonary metastasis. Recommend PET/CT and tissue sampling for further assessment.      Multiple enlarged mediastinal and right hilar lymph nodes measuring up to 2.4 cm, consistent with nodal metastasis.      Tiny lytic lesions with remote fracture deformity and right lateral fourth rib. Recommend attention on PET/CT.      Subtle mild peripheral predominant reticulation in bilateral upper lobes, increased in prominence since 2019 and may represent a early interstitial lung abnormalities.       CTAP 10/27/22  Innumerable ill-defined masses throughout the liver and spleen with trace perihepatic ascites and associated enlarged periportal lymph node, concerning for metastatic disease. Tissue sampling recommended.      Pericardiac soft tissue nodule is concerning for metastatic deposit.      Irregular circumferential thickening of the bladder wall, most prominent within the bladder dome. Recommend urologic consultation and possible direct visualization.      Stable saccular infrarenal abdominal aneurysm measuring up to 4.6 cm since February 2024.

## 2022-11-05 NOTE — Unmapped (Signed)
Physician Discharge Summary [facility]  [address] [facility]  [address]  [place] [address]  Dept: [phone]  Loc: [phone]     Identifying Information:   KHI COSTILLA  Jan 14, 1946  [id number]    Primary Care Physician: Holland Commons, MD     Code Status: DNR and DNI    Admit Date: 10/27/2022    Discharge Date: 11/05/2022     Discharge To: Home Hospice    Discharge Service: [facility] - General Medicine Floor Team (MED U - Tower)     Discharge Attending Physician: Zollie Beckers, MD    Discharge Diagnoses:   Principal Problem:    Diarrhea (POA: Unknown)  Active Problems:    Coronary artery disease involving native coronary artery of native heart without angina pectoris (POA: Yes)    Essential hypertension (POA: Yes)    Tobacco use disorder (POA: Yes)    CKD (chronic kidney disease), stage III (POA: Yes)    Acquired central hypothyroidism (POA: Yes)    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 *      Hospital Course:   TAB MCLOUTH is a 77 y.o. 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         Liver Masses c/f metastatic disease  Patient with transaminitis on admission and CT findings concerning for metastatic disease involving numerous liver masses; spleen and lung involvement with enlarged periportal lymph nodes. Gallbladder normal and no ductal dilation on initial imaging although LFTs/Bili continues to uptrend this hospital stay raising concerns for biliary obstruction likely related to mass effect. 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 Med onc consulted to evaluate patient for potential inpatient versus outpatient treatment options given concern for worsening liver function. Long discussion with patient and family with oncology and medical team present on 3/16. Per hepatology there is no intervention to reverse his hepatic dysfunction as it is the result of hepatic parenchymal replacement with tumor. Patient and family made decision to pursue home hospice and focus on managing pain and symptoms. Patient was discharged home on 3/17 with home hospice.     Transaminitis/Hyperbilirubinemia  As above. Biliary GI consulted and MRCP performed on 3/12 showing no evidence of biliary duct dilation, stones or biliary obstruction. Gallbladder wall edema and periportal edema likely secondary to underlying liver dx. Bilirubin increasing, TB 6.5, DB 5.2 despite normal MRCP findings. C/f worsening liver dysfunction. PT-INR elevated today to 25-2.30, c/f for worsening liver function and possible DIC. No intervention was pursued as none available given liver dysfunction is result of malignancy.     Diarrhea  Presenting complaint complicated by hypovolemia and dehydration. Patient continues with diarrhea although with some improvement. Present for at least 3 weeks now. GI pathogen panel and C. difficile negative. CT AP without obvious inflammatory changes although noted fatty infiltration of the ascending and transverse  colonic wall, most likely related to sequelae of chronic inflammation. Patient continued to have diarrhea despite imodium. He was give maintenance fluids to maintain hydration.     AKI on CKD 3b (GFR 30-45)  Metabolic Acidosis without AG  BL 1.5-1.7; presented with AKI in setting of diarrhea and hypovolemia. Improved with fluids although most recently again uptreding after fluid discontinuation. Ongoing rise in Cr to 2.15. Will continue IVF with ongoing close monitoring. Cr improved to 1.96.     Ventricular Tachycardia   Episode of 5 beats of VT noted on telemetry ON on 3/13. Patient asymptomatic and HDS. No further episodes. SVT x 8 seconds overnight. Patient remained hemodynamically stable throughout admission with no further episodes of VT.     Thrombocytopenia  Presented with mild decrease in platelets to 114 on admission.. Lower concern for HIT as he was already low on presentation. No evidence of hemolysis given normal haptoglobin (LDH significantly elevated although suspect this may be due to malignancy). B12/Folate normal. PLTs on discharge were 67.       Urine Culture w/ E. faecalis  Urine culture with 10-50k colonies of Enterococcus faecalis; given immunosuppressed state (on Prednisone and Cellcept), bladder outlet obstruction with intermittent catheterization, and recent hematuria (s/p 3 days of Zosyn now transitioned to Vancomycin). He completed 14 days of treatment with vancomycin and showed no further signs of infection. We continued daily bladder scans with straight caths for any volume greater than . Patient's urine output improved and his was voiding on his own upon discharge.     Upper Lobe Mass R Lung  Patient with known tobacco use history. 1ppd since teenager. CT scan showing 4.3 cm posterior RUL mass with multiple nodules. 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     Irregular Thickening of Bladder Wall   Concerning for another possible source of malignancy, as bladder cancer can spread to liver and lung. Of note, patient had various episodes of gross hematuria, although U/A showed a decrease in RBCs from prior sample at admission. Urology consulted, appreciate evaluation and recommendations. Stated that they would proceed with a bladder scan to make sure he is not retaining and urine studies first as the rest of the workup can be done in the outpatient setting, including cystoscopy. They would recommend proceeding first with the liver biopsy as already planned.        Outpatient Provider Follow Up Issues:   - Patient discharged to home hospice. His antibiotics and immunosuppression therapy were discontinued. He was kept on his home amlodipine to control his hypertension. He was started on imodium for his persistent diarrhea. Pain management will by managed by the hospice team.         Procedures:  biopsy: liver biopsy via VIR and MRCP  ______________________________________________________________________  Discharge Medications:      Your Medication List        ASK your doctor about these medications      amlodipine 5 MG tablet  Commonly known as: NORVASC  Take 1 tablet (5 mg total) by mouth daily.     carvedilol 6.25 MG tablet  Commonly known as: COREG  Take 2 tablets (12.5 mg total) by mouth two (2) times a day.     clopidogrel 75 mg tablet  Commonly known as: PLAVIX  Take 1 tablet (75 mg total) by mouth daily. Frequency:QD   Dosage:75   MG  Instructions:  Note:Dose: 75MG      cyanocobalamin (vitamin B-12) 1,000 mcg  Subl  Take 1 tablet by mouth in the morning.     ELIQUIS 2.5 mg Tab  Generic drug: apixaban  TAKE 1 TABLET BY MOUTH TWICE DAILY     empty container Misc  Commonly known as: SHARPS-A-GATOR DISPOSAL SYSTEM  Use as directed for sharps disposal     escitalopram oxalate 10 MG tablet  Commonly known as: LEXAPRO  Take 2 tablets (20 mg total) by mouth daily.     ezetimibe 10 mg tablet  Commonly known as: ZETIA  Take 2 tablets (20 mg total) by mouth daily.     finasteride 5 mg tablet  Commonly known as: PROSCAR  Take 1 tablet (5 mg total) by mouth daily.     levothyroxine 100 MCG tablet  Commonly known as: SYNTHROID  Take 1 tablet (100 mcg total) by mouth daily.     LORazepam 0.5 MG tablet  Commonly known as: ATIVAN  Take 1 tablet (0.5 mg total) by mouth two (2) times a day. Can take extra prn     LYRICA 150 mg capsule  Generic drug: pregabalin  Take 1 capsule (150 mg total) by mouth daily.     mirtazapine 15 MG tablet  Commonly known as: REMERON  Take 1 tablet (15 mg total) by mouth nightly. Pt is taking 1.5 tablet.     multivitamin with minerals tablet  Take 1 tablet by mouth daily.  Ask about: Should I take this medication?     mycophenolate 250 mg capsule  Commonly known as: CELLCEPT  Take 1 capsule (250 mg total) by mouth two (2) times a day for 30 days, THEN 2 capsules (500 mg total) two (2) times a day for 30 days, THEN 4 capsules (1,000 mg total) two (2) times a day.  Start taking on: June 28, 2022     nitroglycerin 0.4 MG SL tablet  Commonly known as: NITROSTAT  Place 1 tablet (0.4 mg total) under the tongue every five (5) minutes as needed for chest pain.     pantoprazole 40 MG tablet  Commonly known as: Protonix  Take 1 tablet (40 mg total) by mouth daily.     predniSONE 10 MG tablet  Commonly known as: DELTASONE  Take 1 tablet (10 mg total) by mouth daily.     REPATHA SURECLICK 140 mg/mL Pnij  Generic drug: evolocumab  Inject the contents of one pen (140 mg) under the skin every fourteen (14) days.     tamsulosin 0.4 mg capsule  Commonly known as: FLOMAX  Take 1 capsule (0.4 mg total) by mouth daily. Frequency:QD   Dosage:0.4   MG  Instructions:  Note:Dose: 0.4MG      testosterone cypionate 200 mg/mL injection  Commonly known as: DEPOTESTOTERONE CYPIONATE  Inject 0.75 mL (150 mg total) into the muscle every fourteen (14) days.              Allergies:  Statins-hmg-coa reductase inhibitors and Doxycycline  ______________________________________________________________________  Pending Test Results:  Pending Labs       Order Current Status    Fecal Fat, Quantitative In process    Pancreatic Elastase, Fecal In process            Most Recent Labs:  All lab results last 24 hours -   No results found for this or any previous visit (from the past 24 hour(s)).      Relevant Studies/Radiology:  IR Biopsy Liver Percutaneous    Result Date: 11/03/2022  EXAM:  PERCUTANEOUS BIOPSY OF MASS LESION  IN LIVER - ULTRASOUND-GUIDED DATE: 11/01/2022 3:03 PM ACCESSION: [id number] UN DICTATED: 11/01/2022 4:43 PM INTERPRETATION LOCATION: [facility] CLINICAL INDICATION: 77 years old Male: US - guided targeted liver biopsy; OK for Wed 3/13 at the earliest (holding [facility]); right upper lobe pulm mass with numerous hepatic lesions c/f metastatic disease COMPARISON: MRI of the abdomen on 10/31/2022  CONSENT: Informed consent was obtained from the patient/patient's health care proxy including a discussion of the alternatives, benefits, and risks including but not limited to infection, bleeding, need for additional procedure, and/or ICU admission. ULTRASOUND GUIDED LIVER BIOPSY: With the patient in the supine position, the right upper quadrant of the abdomen was prepped and draped using all elements of maximal sterile barrier technique.  A limited ultrasound examination of the liver was performed to identify a suitable site for biopsy.  A skin entry site was selected and anesthetized with 1% lidocaine with epinephrine along the right anterior axillary line.  Pre-procedural ultrasound evaluation demonstrated multiple hepatic lesions.. Under ultrasound guidance, a 17-G coaxial needle was carefully placed through the liver to the border of the mass as to avoid large vessels.  Through the needle, a 10 cm, 18-G biopsy needle was inserted and multiple biopsies were performed from the mass. The specimens were subsequently labeled and sent to pathology.  The biopsy track was filled with gel-foam slurry and the coaxial needle was removed.  A limited ultrasound exam after the biopsies revealed no hematoma or doppler evidence of active bleeding.  A sterile dressing was placed over the puncture site.  SEDATION: I personally spent 14 minutes, continuously monitoring the patient face-to-face during the administration of moderate sedation.  Radiology nurse was present for the duration of the procedure to assist in patient monitoring.  Pre and Post Sedation activities have been reviewed.     Successful ultrasound-guided percutaneous biopsies of liver mass with 3 core samples. I, Dr. Trude Mcburney, was present for and supervised the critical portions of the procedure.    MRI Abdomen W Wo Contrast MRCP    Result Date: 11/01/2022  EXAM: MRI abdomen with and without contrast, MRCP ACCESSION: [id number] UN CLINICAL INDICATION: 77 years old with RUQ pain, hx hepatic masses, c/f biliary stones  COMPARISON: 10/27/2022 CT chest, abdomen, pelvis. TECHNIQUE: MRI of the abdomen was obtained with and without IV contrast.  Multisequence, multiplanar and dedicated T2 weighted images that highlight the biliary tree were obtained. CONTRAST: 10 mL of  Multihance FINDINGS: LOWER CHEST: Small right pleural effusion. Trace left pleural effusion. ABDOMEN: HEPATOBILIARY: Mildly undulated hepatic contour. Enlarged liver. Diffuse replacement of the hepatic parenchyma by ill-defined hypoenhancing now mostly confluent masses, for reference 1.5 x 1.3 cm segment II lesion (19:33) and 2.3 x 2.2 cm hepatic segment V lesion (19:47). There is also another T2 hyperintense heterogeneously enhancing confluent area at the liver dome involving segment 8 and segment 4 measuring 9.2 x 6.3 cm (series 19 image 13). Gallbladder is present and mildly contracted with mild to moderate gallbladder wall edema which could be associated with patient's underlying liver disease. No biliary ductal dilatation. Mild to moderate periportal edema. PANCREAS: Unremarkable. SPLEEN: The spleen is normal in size. A 0.6 cm T2 hyperintense lesion which is indeterminant. Its enhancement pattern is not reliably assessed on postcontrast series because of the motion artifacts but this lesion could also be benign. ADRENAL GLANDS: Unremarkable. KIDNEYS/URETERS: Renal cortical thinning and mild atrophic kidneys suggestive of chronic medical renal disease. Bilateral renal cysts, measuring up to 2.2 cm in the right lower pole and  1.5 cm in the left upper pole. No solid renal mass. No hydronephrosis. BOWEL/PERITONEUM/RETROPERITONEUM: No bowel obstruction. The colonic loops appear decompressed.. Small volume ascites. VASCULATURE: Infrarenal stable saccular abdominal aortic aneurysm measuring up to 4.6 cm (19:73), similar to recent CT abdomen and pelvis. Prominent noncalcified atherosclerotic changes of the abdominal aorta extending to the iliac arteries and proximal parts of remaining branch vessels. Unremarkable inferior vena cava. LYMPH NODES: No adenopathy. Previously demonstrated porta hepatis node with heterogeneous enhancement is again identified and appears unchanged compared to prior measuring 1.0 cm (series 8 image 47). BONES/SOFT TISSUES: Heterogeneous diffusion restriction of the bone marrow with mild heterogeneous enhancement of the bones on post gadolinium series and T1 hypointense foci on precontrast series.      Diffuse replacement of the hepatic parenchyma by innumerable confluent ill-defined lesions, again concerning for diffuse hepatic metastatic disease. No evidence of biliary ductal dilatation. No evidence of bile duct stones or biliary obstruction. Gallbladder wall edema and periportal edema are likely secondary to underlying liver disease. Small volume ascites. Small volume right and trace left pleural effusions. Bilateral mildly atrophic kidneys. Stable size of 4.6 cm infrarenal saccular abdominal aortic aneurysm. Prominent noncalcified atherosclerotic changes of the aorta and its branch vessels. Heterogeneous diffusion restriction of the bone marrow with mild heterogeneous enhancement of the bones on post gadolinium series. This finding is nonspecific and could be secondary to diffuse metastatic disease due to the presence of multiple T1 hypointense round foci in the bones.     ECG 12 Lead    Result Date: 10/31/2022  NORMAL SINUS RHYTHM NONSPECIFIC T WAVE ABNORMALITY ABNORMAL ECG WHEN COMPARED WITH ECG OF 31-Oct-2022 01:18, NO SIGNIFICANT CHANGE WAS FOUND Confirmed by Warnell Forester [phone]) on 10/31/2022 9:50:48 PM    ECG 12 Lead    Result Date: 10/31/2022  NORMAL SINUS RHYTHM NORMAL ECG WHEN COMPARED WITH ECG OF 27-Oct-2022 09:49, NO SIGNIFICANT CHANGE WAS FOUND Confirmed by Warnell Forester [phone]) on 10/31/2022 9:50:42 PM    CT Chest Wo Contrast    Result Date: 10/27/2022  EXAM: CT CHEST WO CONTRAST ACCESSION: [id number] UN CLINICAL INDICATION: 3 cm nodule, eval TECHNIQUE: Contiguous noncontrast axial images were reconstructed through the chest following a single breath hold helical acquisition. Images were reformatted in the axial. coronal, and sagittal planes. MIP slabs were also constructed. COMPARISON: Same day chest radiograph FINDINGS: LUNGS, AIRWAYS, AND PLEURA: 4.3 cm lobulated posterior right upper lobe mass (2:68). Multiple scattered bilateral subcentimeter pulmonary nodules measuring up to 0.7 cm (series 2, image #41, 42, 66, 79, 91, 167). Left apical pleural-based nodular scarring, unchanged since 2018. Subtle peripheral predominant reticulation more prominent in bilateral upper lobes, increased in prominence since 2018. Central airways are patent. No pleural effusion or pneumothorax. MEDIASTINUM AND LYMPH NODES: Multiple enlarged mediastinal and right hilar lymph nodes measuring up to 2.4 cm. No enlarged intrathoracic, axillary, or supraclavicular lymph nodes. No mediastinal mass or other abnormality. HEART AND VASCULATURE: Qualitatively enlarged left atrium. No pericardial effusion. Aorta is normal in caliber. Main pulmonary artery is normal in size. Moderate three-vessel coronary calcifications. Atherosclerotic aortic calcifications. CHEST WALL AND BONES: Enlarged bilateral supraclavicular lymph nodes measuring up to 1.5 cm. 0.8 cm anterior epicardial lymph node (2:153). Multilevel degenerative changes in spine multilevel vertebral body hemangiomas. Tiny lytic lesions with remote fracture deformity within right lateral fourth rib. UPPER ABDOMEN: Unremarkable. OTHER: No other findings.     4.3 cm malignant posterior right upper lobe mass with multiple bilateral subcentimeter pulmonary nodules measuring up to 0.7 cm, may  represent pulmonary metastasis. Recommend PET/CT and tissue sampling for further assessment. Multiple enlarged mediastinal and right hilar lymph nodes measuring up to 2.4 cm, consistent with nodal metastasis. Tiny lytic lesions with remote fracture deformity and right lateral fourth rib. Recommend attention on PET/CT. Subtle mild peripheral predominant reticulation in bilateral upper lobes, increased in prominence since 2019 and may represent a early interstitial lung abnormalities.     CT Abdomen Pelvis W IV Contrast    Result Date: 10/27/2022  EXAM: CT ABDOMEN PELVIS W CONTRAST ACCESSION: [id number] UN CLINICAL INDICATION: 77 years old with LLQ abd pain and diarrhea, concern for diverticulitis  COMPARISON: CTA abdomen and pelvis dated 10/14/2022 TECHNIQUE: A helical CT scan of the abdomen and pelvis was obtained following IV contrast from the lung bases through the pubic symphysis. Images were reconstructed in the axial plane. Coronal and sagittal reformatted images were also provided for further evaluation. FINDINGS: LOWER CHEST: Bilateral dependent atelectasis. No lobar consolidation or pleural effusion. Partially visualized coronary stents or calcifications. Pericardiac soft tissue nodule abutting the pericardium (2:12). LIVER: Mild nodular contour of the right hepatic lobe (2:48).Innumerable hypoattenuating ill-defined masses throughout the liver measuring up to 3.2 cm within hepatic segment 4/7 (2:25) on the portal venous phase. These are not visible on the prior CTA abdomen/pelvis due to the differences in phases of contrast. BILIARY: The gallbladder is normal in appearance. No biliary ductal dilatation.  SPLEEN: Multiple scattered hypodensities identified throughout the spleen, concerning for metastatic involvement. PANCREAS: Normal pancreatic contour.  No focal lesions.  No ductal dilation. ADRENAL GLANDS: Normal appearance of the adrenal glands. KIDNEYS/URETERS: Cortical thinning and scarring of the bilateral kidneys. Symmetric renal enhancement.  No hydronephrosis. Right lower pole renal cyst. Bilateral subcentimeter hypodensities within the kidneys are too small to fully characterized. BLADDER: Irregular circumferential thickening of the bladder wall is unchanged with mural nodularities (2:128 and 121), most prominent in the bladder dome (coronal 4:57). REPRODUCTIVE ORGANS: Mildly enlarged prostate. GI TRACT: No findings of bowel obstruction or acute inflammation. Normal appendix (2:78). Fatty infiltration of the ascending and transverse colonic wall, most likely related to sequelae of chronic inflammation. PERITONEUM, RETROPERITONEUM AND MESENTERY: No free air. Trace amount of perihepatic ascites (2:23 and 2:46 and 2:56). LYMPH NODES: Enlarged periportal lymph node measuring up to 1.1 cm (2:51) VESSELS: Hepatic and portal veins are patent. Calcified and noncalcified plaques within the abdominal aorta and its branches. There is saccular infrarenal abdominal aneurysm measuring up to 4.6 cm, stable since February 2024 CTA however increased in size compared to 2021 measured 4.1 cm. Short segment dissection flap within the bilateral common iliac artery, unchanged. BONES and SOFT TISSUES: No aggressive osseous lesions.  No focal soft tissue lesions. Bilateral fat-containing inguinal hernia, small. Multilevel spondylosis. Old healed right rib fracture. Lytic area within the L2 vertebral body, compatible with intraosseous hemangioma. Tiny fat-containing umbilical hernia.     Innumerable ill-defined masses throughout the liver and spleen with trace perihepatic ascites and associated enlarged periportal lymph node, concerning for metastatic disease. Tissue sampling recommended. Pericardiac soft tissue nodule is concerning for metastatic deposit. Irregular circumferential thickening of the bladder wall, most prominent within the bladder dome. Recommend urologic consultation and possible direct visualization. Stable saccular infrarenal abdominal aneurysm measuring up to 4.6 cm since February 2024.     ECG 12 Lead    Result Date: 10/27/2022  SINUS BRADYCARDIA NON-SPECIFIC ST/T WAVE CHANGES BORDERLINE ECG WHEN COMPARED WITH ECG OF 22-May-2022 14:49, PREMATURE VENTRICULAR BEATS ARE NO LONGER PRESENT QUESTIONABLE CHANGE IN QRS AXIS  T WAVE INVERSION NOW EVIDENT IN INFERIOR LEADS NONSPECIFIC T WAVE ABNORMALITY NO LONGER EVIDENT IN LATERAL LEADS QT HAS SHORTENED Confirmed by Christella Noa ([id number]) on 10/27/2022 11:09:40 AM    XR Chest 2 views    Result Date: 10/27/2022  EXAM: XR CHEST 2 VIEWS ACCESSION: [id number] UN CLINICAL INDICATION: COUGH  TECHNIQUE: PA and Lateral Chest Radiographs. COMPARISON: 06/30/2020 FINDINGS: Mild diffuse bronchial wall thickening. 3 cm mass in the right upper lobe. No pleural effusion or pneumothorax. Cardiac silhouette is normal in size. Coronary artery stents. Thoracic aorta is tortuous with calcifications.     Diffuse bronchial wall thickening due to asthma or bronchitis. Suspect 3 cm mass in the right upper lobe. Recommend further evaluation with CT of the chest. FOLLOW-UP RECOMMENDATION: Item for Follow Up: 1. Acuity: Non-urgent 2. Modality: CT 3. Anatomy: Chest 4. TimeFrame: ASAP    ______________________________________________________________________  Discharge Instructions:           Resources and Referrals       Hospital Bed (DME)      Type: Semi Electric    Length of Need: lifetime    Height 6 ft  Weight 223lb        Height 6 ft  Weight 223lb    Walker      Type: Rolling    Wheels: 4 Wheels & Seat    Length of Need: lifetime    Rollator  Height 6 ft  Weight 223lb        Rollator  Height 6 ft  Weight 223lb Follow Up instructions and Outpatient Referrals     Ambulatory referral to Home Health      Is this a [employer] or [facility] Patient?: No    Physician to follow patient's care: PCP    Disciplines requested:  Nursing  Physical Therapy  Occupational Therapy  Home Health Aide       Nursing requested: Teaching/skilled observation and assessment    What teaching is needed (new diagnosis? new medications?): disease   management, medication managment and teaching    Physical Therapy requested:  Home safety evaluation  Evaluate and treat       Occupational Therapy Requested:  Home safety evaluation  Evaluate and treat       Requested [facility] Date: 11/08/2022    Ambulatory referral to Hospice      Facility Type: Home-based        Appointments which have been scheduled for you      Nov 22, 2022 11:00 AM  (Arrive by 10:45 AM)  CT UROGRAM with IC CT RM 2  RAD [facility] [address] [facility]) [address] [facility] [address]  [address]  [place] [address]  [phone]   On appt date:  Drink lots of water 24 hrs  Bring recent lab work  Take meds as usual  [employer] fast streamer of current meds  Bring snack if diabetic    On appt date do not:  Consume anything 2 hrs prior to your appointment    Let us know if pt:  Allergic to contrast dyes  Diabetic  Pregnant or nursing  Claustrophobic    (Title:CTWCNTRST)              ______________________________________________________________________  Discharge Day Services:  BP 134/69  - Pulse 59  - Temp 36.5 ??C (97.7 ??F) (Oral)  - Resp 17  - Ht 182.9 cm (6')  - Wt (!) 101.5 kg (223 lb 12.8 oz)  - SpO2 98%  -  BMI 30.35 kg/m??     Pt seen on the day of discharge and determined appropriate for discharge.    Condition at Discharge: stable    Length of Discharge: I spent less than 30 mins in the discharge of this patient.    Gershon Cull, DO  PGY-1 Anesthesiology

## 2022-11-05 NOTE — Unmapped (Signed)
Problem: Adult Inpatient Plan of Care  Goal: Plan of Care Review  Outcome: Progressing  Goal: Patient-Specific Goal (Individualized)  Outcome: Progressing  Goal: Absence of Hospital-Acquired Illness or Injury  Outcome: Progressing  Intervention: Identify and Manage Fall Risk  Recent Flowsheet Documentation  Taken 11/05/2022 0800 by Shanon Payor, RN  Safety Interventions: fall reduction program maintained  Intervention: Prevent Skin Injury  Recent Flowsheet Documentation  Taken 11/05/2022 0800 by Shanon Payor, RN  Positioning for Skin: Supine/Back  Device Skin Pressure Protection: absorbent pad utilized/changed  Skin Protection: incontinence pads utilized  Intervention: Prevent and Manage VTE (Venous Thromboembolism) Risk  Recent Flowsheet Documentation  Taken 11/05/2022 0800 by Shanon Payor, RN  VTE Prevention/Management:   ambulation promoted   fluids promoted  Anti-Embolism Intervention: Off  Intervention: Prevent Infection  Recent Flowsheet Documentation  Taken 11/05/2022 0800 by Shanon Payor, RN  Infection Prevention:   rest/sleep promoted   hand hygiene promoted  Goal: Optimal Comfort and Wellbeing  Outcome: Progressing  Goal: Readiness for Transition of Care  Outcome: Progressing  Goal: Rounds/Family Conference  Outcome: Progressing     Problem: Infection  Goal: Absence of Infection Signs and Symptoms  Outcome: Progressing  Intervention: Prevent or Manage Infection  Recent Flowsheet Documentation  Taken 11/05/2022 0800 by Shanon Payor, RN  Infection Management: aseptic technique maintained     Problem: Self-Care Deficit  Goal: Improved Ability to Complete Activities of Daily Living  Outcome: Progressing     Problem: VTE (Venous Thromboembolism)  Goal: Tissue Perfusion  Outcome: Progressing  Intervention: Optimize Tissue Perfusion  Recent Flowsheet Documentation  Taken 11/05/2022 0800 by Shanon Payor, RN  Bleeding Precautions:   blood pressure closely monitored   monitored for signs of bleeding  VTE Prevention/Management:   ambulation promoted   fluids promoted  Anti-Embolism Intervention: Off  Goal: Right Ventricular Function  Outcome: Progressing     Problem: Fall Injury Risk  Goal: Absence of Fall and Fall-Related Injury  Outcome: Progressing  Intervention: Promote Injury-Free Environment  Recent Flowsheet Documentation  Taken 11/05/2022 0800 by Shanon Payor, RN  Safety Interventions: fall reduction program maintained     Problem: Wound  Goal: Optimal Coping  Outcome: Progressing  Goal: Optimal Functional Ability  Outcome: Progressing  Goal: Absence of Infection Signs and Symptoms  Outcome: Progressing  Intervention: Prevent or Manage Infection  Recent Flowsheet Documentation  Taken 11/05/2022 0800 by Shanon Payor, RN  Infection Management: aseptic technique maintained  Goal: Improved Oral Intake  Outcome: Progressing  Goal: Optimal Pain Control and Function  Outcome: Progressing  Goal: Skin Health and Integrity  Outcome: Progressing  Intervention: Optimize Skin Protection  Recent Flowsheet Documentation  Taken 11/05/2022 0800 by Shanon Payor, RN  Pressure Reduction Techniques: frequent weight shift encouraged  Pressure Reduction Devices: pressure-redistributing mattress utilized  Skin Protection: incontinence pads utilized  Goal: Optimal Wound Healing  Outcome: Progressing

## 2022-11-05 NOTE — Unmapped (Signed)
Alert and oriented x4. Breathing normal easy and regular on room air.  Call bell and phone kept in reach. Bed kept at lowest position and breaks kept locked. Slept fairly well. Will monitor.    Problem: Adult Inpatient Plan of Care  Goal: Plan of Care Review  Outcome: Progressing  Flowsheets (Taken 11/05/2022 0650)  Progress: improving  Plan of Care Reviewed With: patient  Goal: Patient-Specific Goal (Individualized)  Outcome: Progressing  Flowsheets (Taken 11/05/2022 0650)  Individualized Care Needs: No fall thru end of shift.  Anxieties, Fears or Concerns: Pain will be tolerable thru end of shift.  Goal: Absence of Hospital-Acquired Illness or Injury  Outcome: Progressing  Intervention: Identify and Manage Fall Risk  Recent Flowsheet Documentation  Taken 11/05/2022 0000 by Lennox Grumbles, RN  Safety Interventions:   fall reduction program maintained   environmental modification   bed alarm  Intervention: Prevent Skin Injury  Recent Flowsheet Documentation  Taken 11/05/2022 0600 by Lennox Grumbles, RN  Positioning for Skin: Supine/Back  Taken 11/05/2022 0400 by Lennox Grumbles, RN  Positioning for Skin: Supine/Back  Taken 11/05/2022 0200 by Lennox Grumbles, RN  Positioning for Skin: Supine/Back  Taken 11/05/2022 0000 by Lennox Grumbles, RN  Positioning for Skin: Supine/Back  Intervention: Prevent and Manage VTE (Venous Thromboembolism) Risk  Recent Flowsheet Documentation  Taken 11/05/2022 0600 by Lennox Grumbles, RN  Anti-Embolism Intervention: (eliquis) Other (Comment)  Taken 11/05/2022 0400 by Lennox Grumbles, RN  Anti-Embolism Intervention: (eliquis) Other (Comment)  Taken 11/05/2022 0200 by Lennox Grumbles, RN  Anti-Embolism Intervention: (eliquis) Other (Comment)  Taken 11/05/2022 0000 by Lennox Grumbles, RN  Anti-Embolism Intervention: (eliquis) Other (Comment)  Intervention: Prevent Infection  Recent Flowsheet Documentation  Taken 11/05/2022 0000 by Lennox Grumbles, RN  Infection Prevention: environmental surveillance performed  Goal: Optimal Comfort and Wellbeing  Outcome: Progressing  Goal: Readiness for Transition of Care  Outcome: Progressing  Goal: Rounds/Family Conference  Outcome: Progressing     Problem: Infection  Goal: Absence of Infection Signs and Symptoms  Outcome: Progressing  Intervention: Prevent or Manage Infection  Recent Flowsheet Documentation  Taken 11/05/2022 0000 by Lennox Grumbles, RN  Infection Management: aseptic technique maintained     Problem: Self-Care Deficit  Goal: Improved Ability to Complete Activities of Daily Living  Outcome: Progressing     Problem: VTE (Venous Thromboembolism)  Goal: Tissue Perfusion  Outcome: Progressing  Intervention: Optimize Tissue Perfusion  Recent Flowsheet Documentation  Taken 11/05/2022 0600 by Lennox Grumbles, RN  Anti-Embolism Intervention: (eliquis) Other (Comment)  Taken 11/05/2022 0400 by Lennox Grumbles, RN  Anti-Embolism Intervention: (eliquis) Other (Comment)  Taken 11/05/2022 0200 by Lennox Grumbles, RN  Anti-Embolism Intervention: (eliquis) Other (Comment)  Taken 11/05/2022 0000 by Lennox Grumbles, RN  Bleeding Precautions: monitored for signs of bleeding  Anti-Embolism Intervention: (eliquis) Other (Comment)  Goal: Right Ventricular Function  Outcome: Progressing     Problem: Fall Injury Risk  Goal: Absence of Fall and Fall-Related Injury  Outcome: Progressing  Intervention: Promote Injury-Free Environment  Recent Flowsheet Documentation  Taken 11/05/2022 0000 by Lennox Grumbles, RN  Safety Interventions:   fall reduction program maintained   environmental modification   bed alarm     Problem: Wound  Goal: Optimal Coping  Outcome: Progressing  Goal: Optimal Functional Ability  Outcome: Progressing  Goal: Absence of Infection Signs and Symptoms  Outcome: Progressing  Intervention: Prevent or Manage Infection  Recent Flowsheet Documentation  Taken 11/05/2022 0000 by Lennox Grumbles, RN  Infection Management: aseptic technique maintained  Goal: Improved Oral Intake  Outcome: Progressing  Goal: Optimal Pain Control and Function  Outcome: Progressing  Goal: Skin Health and Integrity  Outcome: Progressing  Intervention: Optimize Skin Protection  Recent Flowsheet Documentation  Taken 11/05/2022 0600 by Lennox Grumbles, RN  Pressure Reduction Techniques: frequent weight shift encouraged  Taken 11/05/2022 0400 by Lennox Grumbles, RN  Pressure Reduction Techniques: frequent weight shift encouraged  Taken 11/05/2022 0200 by Lennox Grumbles, RN  Pressure Reduction Techniques: frequent weight shift encouraged  Taken 11/05/2022 0000 by Lennox Grumbles, RN  Pressure Reduction Techniques: frequent weight shift encouraged  Goal: Optimal Wound Healing  Outcome: Progressing

## 2022-11-05 NOTE — Unmapped (Signed)
SW consult acknowledged. SW consult was for end of life concerns. Per CMA, patient to discharge with son tomorrow with home hospice. There are no specific SW needs. CM has addressed end of life concerns and discharge planning.    Ermelinda Das 11/05/2022 10:00 AM    Lillia Corporal, LCSW  Inpatient Social Worker  Care Management  [phone]

## 2022-11-05 NOTE — Unmapped (Signed)
Hepatology Consult Service   Initial Consultation         Assessment and Recommendations:   Christopher Brennan is a 77 y.o. 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 The patient is seen in consultation at the request of Zollie Beckers, MD ([facility] ([facility])) for  elevated liver enzymes and INR .    He is seen for elevated liver enzymes and rising INR.  He has predominant bilirubin elevation.  MRI shows no biliary obstruction.  Based upon review of imaging showing diffuse replacement of his liver by innumerable hepatic mets, his worsening INR and liver enzymes reflect burden of his metastatic disease.  Unfortunately, there does not appear to be any biliary obstruction that could be intervened upon to improve his bilirubin.  If elevated bilirubin precludes use of chemotherapy in this case, recommend goals of care discussions.    Since seeing this patient in consult today, it appears that the patient has opted to pursue transition to hospice care.    Issues Impacting Complexity of Management:  -None    Recommendations discussed with the patient's primary team. We will sign-off at this time, please re-contact if additional questions or a new need for consultation arises.    For questions, contact the on-call fellow for the Hepatology Consult Service.    Subjective:   Patient is seen today for elevated transaminases with predominant hyperbilirubinemia.  INR has progressively worsened from 1.2 up to 2.3 today.  CT showed diffuse hepatic replacement by innumerable metastatic lesions.  MRI/MRCP showed no biliary obstruction.  He says he feels very tired and wants to continue living as long as possible.  He denies any fevers, chills, nausea, vomiting.  He denies any known history of liver disease.    Objective:   Temp:  [36.6 ??C (97.9 ??F)-37.2 ??C (99 ??F)] 37.1 ??C (98.7 ??F)  Heart Rate:  [57-64] 57  Resp:  [18] 18  BP: (112-137)/(69-76) 112/69  SpO2:  [96 %-98 %] 98 %    Gen: WDWN male in NAD, answers questions appropriately  Abdomen: Soft, NTND, no rebound/guarding, no hepatosplenomegaly  Extremities: No edema in the BLEs    Pertinent Labs/Studies:  Labs notable for creatinine 1.96, T. bili 6.9, AST 315, ALT 170, alkaline phosphatase 796, INR 2.57. CT scan and MRI findings as noted above.

## 2022-11-05 NOTE — Unmapped (Signed)
Problem: Adult Inpatient Plan of Care  Goal: Plan of Care Review  Outcome: Progressing  Goal: Patient-Specific Goal (Individualized)  Outcome: Progressing  Goal: Absence of Hospital-Acquired Illness or Injury  Outcome: Progressing  Intervention: Identify and Manage Fall Risk  Recent Flowsheet Documentation  Taken 11/05/2022 0800 by Shanon Payor, RN  Safety Interventions: fall reduction program maintained  Intervention: Prevent Skin Injury  Recent Flowsheet Documentation  Taken 11/05/2022 0800 by Shanon Payor, RN  Positioning for Skin: Supine/Back  Device Skin Pressure Protection: absorbent pad utilized/changed  Skin Protection: incontinence pads utilized  Intervention: Prevent Infection  Recent Flowsheet Documentation  Taken 11/05/2022 0800 by Shanon Payor, RN  Infection Prevention:   rest/sleep promoted   hand hygiene promoted  Goal: Optimal Comfort and Wellbeing  Outcome: Progressing  Goal: Readiness for Transition of Care  Outcome: Progressing  Goal: Rounds/Family Conference  Outcome: Progressing     Problem: Infection  Goal: Absence of Infection Signs and Symptoms  Outcome: Progressing  Intervention: Prevent or Manage Infection  Recent Flowsheet Documentation  Taken 11/05/2022 0800 by Shanon Payor, RN  Infection Management: aseptic technique maintained     Problem: Self-Care Deficit  Goal: Improved Ability to Complete Activities of Daily Living  Outcome: Progressing     Problem: VTE (Venous Thromboembolism)  Goal: Tissue Perfusion  Outcome: Progressing  Intervention: Optimize Tissue Perfusion  Recent Flowsheet Documentation  Taken 11/05/2022 0800 by Shanon Payor, RN  Bleeding Precautions:   blood pressure closely monitored   monitored for signs of bleeding  Goal: Right Ventricular Function  Outcome: Progressing     Problem: Fall Injury Risk  Goal: Absence of Fall and Fall-Related Injury  Outcome: Progressing  Intervention: Promote Injury-Free Environment  Recent Flowsheet Documentation  Taken 11/05/2022 0800 by Shanon Payor, RN  Safety Interventions: fall reduction program maintained     Problem: Wound  Goal: Optimal Coping  Outcome: Progressing  Goal: Optimal Functional Ability  Outcome: Progressing  Goal: Absence of Infection Signs and Symptoms  Outcome: Progressing  Intervention: Prevent or Manage Infection  Recent Flowsheet Documentation  Taken 11/05/2022 0800 by Shanon Payor, RN  Infection Management: aseptic technique maintained  Goal: Improved Oral Intake  Outcome: Progressing  Goal: Optimal Pain Control and Function  Outcome: Progressing  Goal: Skin Health and Integrity  Outcome: Progressing  Intervention: Optimize Skin Protection  Recent Flowsheet Documentation  Taken 11/05/2022 0800 by Shanon Payor, RN  Pressure Reduction Techniques: frequent weight shift encouraged  Pressure Reduction Devices: pressure-redistributing mattress utilized  Skin Protection: incontinence pads utilized  Goal: Optimal Wound Healing  Outcome: Progressing

## 2022-11-05 NOTE — Unmapped (Signed)
Patient AxOx4, family visited during the day. He reported pain in shoulders and back of neck. Voltaren applied. Has redness/dermatitis in the perineal area and buttocks.Barrier cream applied. Had 4 episodes of watery bowel movements that were collected.

## 2022-11-06 MED ORDER — LIDOCAINE 4 % TOPICAL PATCH
MEDICATED_PATCH | Freq: Every day | TRANSDERMAL | 0 refills | 30.00000 days | Status: CP
Start: 2022-11-06 — End: 2022-12-06

## 2022-11-07 NOTE — Unmapped (Signed)
Spoke to the patient's daughter this morning, Christopher Brennan just recently was sent home from the hospital with stage 4 Cancer, therefore he will not be receiving any further treatments or medication.

## 2022-11-20 DEATH — deceased

## 2022-12-20 DIAGNOSIS — I252 Old myocardial infarction: Principal | ICD-10-CM

## 2022-12-20 DIAGNOSIS — I2510 Atherosclerotic heart disease of native coronary artery without angina pectoris: Principal | ICD-10-CM

## 2022-12-20 DIAGNOSIS — Z789 Other specified health status: Principal | ICD-10-CM

## 2022-12-20 DIAGNOSIS — E782 Mixed hyperlipidemia: Principal | ICD-10-CM

## 2022-12-20 MED ORDER — REPATHA SURECLICK 140 MG/ML SUBCUTANEOUS PEN INJECTOR
SUBCUTANEOUS | 2 refills | 28 days | Status: CN
Start: 2022-12-20 — End: 2023-03-20

## 2022-12-20 NOTE — Unmapped (Signed)
Specialty Medication(s): Repatha    Mr.Gersh has been dis-enrolled from the [facility] specialty pharmacy services due to patient is deceased.    Additional information provided to the patient: n/a    Camillo Flaming, PharmD  [facility] Specialty Pharmacist
